# Patient Record
Sex: Female | Born: 1935 | Race: White | Hispanic: No | Smoking: Never smoker
Health system: Southern US, Community
[De-identification: ages and names within clinical notes are randomized; demographics above are authoritative.]

## PROBLEM LIST (undated history)

## (undated) DIAGNOSIS — E785 Hyperlipidemia, unspecified: Secondary | ICD-10-CM

## (undated) DIAGNOSIS — Z9889 Other specified postprocedural states: Secondary | ICD-10-CM

## (undated) DIAGNOSIS — I639 Cerebral infarction, unspecified: Secondary | ICD-10-CM

## (undated) DIAGNOSIS — K219 Gastro-esophageal reflux disease without esophagitis: Secondary | ICD-10-CM

## (undated) DIAGNOSIS — I739 Peripheral vascular disease, unspecified: Secondary | ICD-10-CM

## (undated) DIAGNOSIS — N189 Chronic kidney disease, unspecified: Secondary | ICD-10-CM

## (undated) DIAGNOSIS — I10 Essential (primary) hypertension: Secondary | ICD-10-CM

## (undated) DIAGNOSIS — R112 Nausea with vomiting, unspecified: Secondary | ICD-10-CM

## (undated) DIAGNOSIS — R55 Syncope and collapse: Secondary | ICD-10-CM

## (undated) DIAGNOSIS — N393 Stress incontinence (female) (male): Secondary | ICD-10-CM

## (undated) HISTORY — PX: ENDOPROSTHESIS HIP: SUR436

## (undated) HISTORY — PX: HX STENTING (ANY): 2100001347

## (undated) HISTORY — PX: CATARACT EXTRACTION W/ INTRAOCULAR LENS IMPLANT: SHX1309

## (undated) HISTORY — PX: BREAST BIOPSY: SHX20

## (undated) HISTORY — DX: Hyperlipidemia, unspecified: E78.5

## (undated) HISTORY — PX: PERIPHERAL ARTERIAL STENT GRAFT: SHX2220

## (undated) HISTORY — PX: FRACTURE SURGERY: SHX138

## (undated) HISTORY — DX: Stress incontinence (female) (male): N39.3

## (undated) HISTORY — DX: Syncope and collapse: R55

## (undated) HISTORY — DX: Chronic kidney disease, unspecified: N18.9

## (undated) HISTORY — DX: Cerebral infarction, unspecified: I63.9

## (undated) HISTORY — DX: Essential (primary) hypertension: I10

## (undated) HISTORY — PX: ORIF ANKLE FRACTURE: SUR919

---

## 1979-04-29 HISTORY — PX: DILATION AND CURETTAGE OF UTERUS: SHX78

## 1979-04-29 HISTORY — PX: APPENDECTOMY: SHX54

## 1979-04-29 HISTORY — PX: ABDOMINAL HYSTERECTOMY: SHX81

## 2003-01-13 ENCOUNTER — Encounter: Payer: Self-pay | Admitting: Internal Medicine

## 2003-01-13 ENCOUNTER — Encounter: Payer: Self-pay | Admitting: Emergency Medicine

## 2003-01-13 ENCOUNTER — Inpatient Hospital Stay (HOSPITAL_COMMUNITY): Admission: EM | Admit: 2003-01-13 | Discharge: 2003-01-16 | Payer: Self-pay | Admitting: Emergency Medicine

## 2003-03-20 ENCOUNTER — Ambulatory Visit (HOSPITAL_COMMUNITY): Admission: RE | Admit: 2003-03-20 | Discharge: 2003-03-20 | Payer: Self-pay | Admitting: Orthopedic Surgery

## 2003-04-03 ENCOUNTER — Ambulatory Visit (HOSPITAL_BASED_OUTPATIENT_CLINIC_OR_DEPARTMENT_OTHER): Admission: RE | Admit: 2003-04-03 | Discharge: 2003-04-04 | Payer: Self-pay | Admitting: Orthopedic Surgery

## 2003-09-10 ENCOUNTER — Encounter: Admission: RE | Admit: 2003-09-10 | Discharge: 2003-09-10 | Payer: Self-pay | Admitting: Family Medicine

## 2003-09-14 ENCOUNTER — Ambulatory Visit (HOSPITAL_COMMUNITY): Admission: RE | Admit: 2003-09-14 | Discharge: 2003-09-14 | Payer: Self-pay | Admitting: Orthopedic Surgery

## 2003-09-14 ENCOUNTER — Ambulatory Visit (HOSPITAL_BASED_OUTPATIENT_CLINIC_OR_DEPARTMENT_OTHER): Admission: RE | Admit: 2003-09-14 | Discharge: 2003-09-14 | Payer: Self-pay | Admitting: Orthopedic Surgery

## 2004-02-03 ENCOUNTER — Ambulatory Visit (HOSPITAL_BASED_OUTPATIENT_CLINIC_OR_DEPARTMENT_OTHER): Admission: RE | Admit: 2004-02-03 | Discharge: 2004-02-03 | Payer: Self-pay | Admitting: Orthopedic Surgery

## 2004-02-03 ENCOUNTER — Ambulatory Visit (HOSPITAL_COMMUNITY): Admission: RE | Admit: 2004-02-03 | Discharge: 2004-02-03 | Payer: Self-pay | Admitting: Orthopedic Surgery

## 2006-05-28 ENCOUNTER — Encounter: Admission: RE | Admit: 2006-05-28 | Discharge: 2006-05-28 | Payer: Self-pay | Admitting: Family Medicine

## 2010-08-28 DIAGNOSIS — R55 Syncope and collapse: Secondary | ICD-10-CM

## 2010-08-28 HISTORY — DX: Syncope and collapse: R55

## 2011-02-21 ENCOUNTER — Ambulatory Visit
Admission: RE | Admit: 2011-02-21 | Discharge: 2011-02-21 | Disposition: A | Discharge status: Home / Self Care | Payer: Medicare Other | Source: Ambulatory Visit | Attending: Urology | Admitting: Urology

## 2011-02-21 ENCOUNTER — Other Ambulatory Visit: Payer: Self-pay | Admitting: Urology

## 2011-02-21 DIAGNOSIS — R32 Unspecified urinary incontinence: Secondary | ICD-10-CM

## 2011-02-23 ENCOUNTER — Ambulatory Visit (HOSPITAL_BASED_OUTPATIENT_CLINIC_OR_DEPARTMENT_OTHER)
Admission: RE | Admit: 2011-02-23 | Discharge: 2011-02-23 | Disposition: A | Discharge status: Home / Self Care | Payer: Medicare Other | Source: Ambulatory Visit | Attending: Urology | Admitting: Urology

## 2011-02-23 DIAGNOSIS — N3642 Intrinsic sphincter deficiency (ISD): Secondary | ICD-10-CM | POA: Insufficient documentation

## 2011-02-23 DIAGNOSIS — K219 Gastro-esophageal reflux disease without esophagitis: Secondary | ICD-10-CM | POA: Insufficient documentation

## 2011-02-23 DIAGNOSIS — I10 Essential (primary) hypertension: Secondary | ICD-10-CM | POA: Insufficient documentation

## 2011-02-23 DIAGNOSIS — Z7982 Long term (current) use of aspirin: Secondary | ICD-10-CM | POA: Insufficient documentation

## 2011-02-23 DIAGNOSIS — Z79899 Other long term (current) drug therapy: Secondary | ICD-10-CM | POA: Insufficient documentation

## 2011-02-23 DIAGNOSIS — N393 Stress incontinence (female) (male): Secondary | ICD-10-CM | POA: Insufficient documentation

## 2011-02-23 LAB — POCT I-STAT, CHEM 8
BUN: 30 mg/dL — ABNORMAL HIGH (ref 6–23)
Creatinine, Ser: 1.4 mg/dL — ABNORMAL HIGH (ref 0.50–1.10)
Potassium: 4 mEq/L (ref 3.5–5.1)
Sodium: 140 mEq/L (ref 135–145)
TCO2: 25 mmol/L (ref 0–100)

## 2011-02-26 NOTE — Op Note (Signed)
NAME:  Jennifer Atkinson, Jennifer Atkinson                ACCOUNT NO.:  [id number]  MEDICAL RECORD NO.:  [id number]  LOCATION:                                 FACILITY:  PHYSICIAN:  Excell Seltzer. Jennifer Atkinson, M.D.         DATE OF BIRTH:  DATE OF PROCEDURE:  02/23/2011 DATE OF DISCHARGE:                              OPERATIVE REPORT   PROCEDURE:  Macroplastique injection.  PREOPERATIVE DIAGNOSIS:  Stress incontinence with intrinsic sphincter deficiency.  POSTOPERATIVE DIAGNOSIS:  Stress incontinence with intrinsic sphincter deficiency.  SURGEON:  Excell Seltzer. Jennifer Atkinson, M.D.  ANESTHESIA:  MAC.  DRAINS:  None.  COMPLICATIONS:  None.  INDICATIONS:  The patient is an 77 year old white female who has a history of stress incontinence with intrinsic sphincter deficiency.  She had previously undergone a Contigen injection and had good results but that has worn off and her incontinence has recurred.  She has now elected to proceed with Macroplastique injection.  FINDINGS AND PROCEDURE:  She was taken to the operating room.  She was given Cipro.  She was placed on table in the lithotomy position and sedation was induced.  Her perineum and genitalia were prepped with Betadine solution and she was draped in usual sterile fashion.  The 24- [place] injection scope was prepared and fitted with the Macroplastique needle and a 12-degree lens in the injection handle.  A Macroplastique 2.5 cc syringe was placed in the injection handle and secured to the needle and the needle was primed.  The instrument was then inserted.  Inspection revealed some thin mucosa overlying prior collagen but in general an unremarkable and somewhat patulous urethra.  The bladder was unremarkable as well.  No tumors, stones or inflammation noted.  Ureteral orifices were unremarkable.  The injection needle was then placed at the 9 o'clock position in the mid to distal ureter at 45-degree angle and advanced to the initial black mark then turned parallel  to the urethra and advanced to the second black mark.  Contigen was injected but minimal bulging was noted. No Contigen was noted protruding from the mucosa initially but after instillation of the syringe, inspection in the bladder revealed debris and a small area of ooze right at the bladder neck despite having the needle placed well distal to the bladder neck.  At this point, a second syringe was secured to the needle and injection was made at the 3 o'clock position beneath the prior collagen injection site.  Bulging of the mucosa was noted but the mucosa split over the collagen, so some of the old collagen and a little bit of Macroplastique also was noted to extrude.  Approximately 1.25 cc was placed in this location.  I then repositioned the needle to the 6 o'clock position and I advanced in the standard fashion and injected the remaining 1.25 cc of Macroplastique collagen.  There was good bulging of the mucosa from this injection without extrusion.  In each site, the needle was left in place 30 seconds after completion of the injection.  At this point, the scope was removed.  The bladder was felt to be full based on vaginal palpation and pressure on the bladder from  above and below produced no leakage.  It was felt that additional Macroplastique was not indicated at this time.  The bladder was then drained with a 12- French red rubber catheter which passed easily.  Once the bladder was drained, the patient was taken down from lithotomy position.  Her anesthetic was reversed.  She was moved to the recovery room in stable condition.  There were no complications.  She will be sent home with a prescription for Cipro for the next week with more prolonged antibiotic because of the exposed collagen and Macroplastique.  She has followup with me on March 13, 2011.     Excell Seltzer. Jennifer Atkinson, M.D.     JJW/MEDQ  D:  02/23/2011  T:  02/23/2011  Job:  [id number]  cc:   L. Lupe Carney, M.D. Fax:  [phone]  Electronically Signed by Bjorn Pippin M.D. on 02/26/2011 12:08:46 PM

## 2011-03-04 ENCOUNTER — Inpatient Hospital Stay (HOSPITAL_COMMUNITY)
Admission: EM | Admit: 2011-03-04 | Discharge: 2011-03-05 | DRG: 312 | Disposition: A | Discharge status: Home / Self Care | Payer: Medicare Other | Attending: Internal Medicine | Admitting: Internal Medicine

## 2011-03-04 ENCOUNTER — Emergency Department (HOSPITAL_COMMUNITY): Payer: Medicare Other

## 2011-03-04 DIAGNOSIS — Z7982 Long term (current) use of aspirin: Secondary | ICD-10-CM

## 2011-03-04 DIAGNOSIS — E860 Dehydration: Secondary | ICD-10-CM | POA: Diagnosis present

## 2011-03-04 DIAGNOSIS — N183 Chronic kidney disease, stage 3 unspecified: Secondary | ICD-10-CM | POA: Diagnosis present

## 2011-03-04 DIAGNOSIS — N390 Urinary tract infection, site not specified: Secondary | ICD-10-CM | POA: Diagnosis present

## 2011-03-04 DIAGNOSIS — N179 Acute kidney failure, unspecified: Secondary | ICD-10-CM | POA: Diagnosis present

## 2011-03-04 DIAGNOSIS — E785 Hyperlipidemia, unspecified: Secondary | ICD-10-CM | POA: Diagnosis present

## 2011-03-04 DIAGNOSIS — R55 Syncope and collapse: Principal | ICD-10-CM | POA: Diagnosis present

## 2011-03-04 DIAGNOSIS — I129 Hypertensive chronic kidney disease with stage 1 through stage 4 chronic kidney disease, or unspecified chronic kidney disease: Secondary | ICD-10-CM | POA: Diagnosis present

## 2011-03-04 LAB — BASIC METABOLIC PANEL
Calcium: 8.9 mg/dL (ref 8.4–10.5)
GFR calc non Af Amer: 28 mL/min — ABNORMAL LOW (ref >60)
Potassium: 3.9 mEq/L (ref 3.5–5.1)
Sodium: 137 mEq/L (ref 135–145)

## 2011-03-04 LAB — CARDIAC PANEL(CRET KIN+CKTOT+MB+TROPI)
Relative Index: INVALID (ref 0.0–2.5)
Troponin I: <0.3 ng/mL (ref <0.30)

## 2011-03-04 LAB — DIFFERENTIAL
Basophils Absolute: 0 10*3/uL (ref 0.0–0.1)
Basophils Relative: 0 % (ref 0–1)
Lymphocytes Relative: 23 % (ref 12–46)
Monocytes Absolute: 0.5 10*3/uL (ref 0.1–1.0)
Monocytes Relative: 5 % (ref 3–12)
Neutro Abs: 6.3 10*3/uL (ref 1.7–7.7)
Neutrophils Relative %: 68 % (ref 43–77)

## 2011-03-04 LAB — URINALYSIS, ROUTINE W REFLEX MICROSCOPIC
Nitrite: NEGATIVE
Protein, ur: NEGATIVE mg/dL
Specific Gravity, Urine: 1.014 (ref 1.005–1.030)
Urobilinogen, UA: 0.2 mg/dL (ref 0.0–1.0)

## 2011-03-04 LAB — CK TOTAL AND CKMB (NOT AT ARMC)
CK, MB: 2 ng/mL (ref 0.3–4.0)
Total CK: 75 U/L (ref 7–177)

## 2011-03-04 LAB — URINE MICROSCOPIC-ADD ON

## 2011-03-04 LAB — CBC
HCT: 31 % — ABNORMAL LOW (ref 36.0–46.0)
Hemoglobin: 10.6 g/dL — ABNORMAL LOW (ref 12.0–15.0)
MCH: 29.9 pg (ref 26.0–34.0)
RBC: 3.55 MIL/uL — ABNORMAL LOW (ref 3.87–5.11)

## 2011-03-04 LAB — TROPONIN I: Troponin I: <0.3 ng/mL (ref <0.30)

## 2011-03-05 LAB — CBC
HCT: 29.1 % — ABNORMAL LOW (ref 36.0–46.0)
Hemoglobin: 9.6 g/dL — ABNORMAL LOW (ref 12.0–15.0)
MCH: 29.3 pg (ref 26.0–34.0)
MCHC: 33 g/dL (ref 30.0–36.0)
MCV: 88.7 fL (ref 78.0–100.0)
Platelets: 252 10*3/uL (ref 150–400)
RBC: 3.28 MIL/uL — ABNORMAL LOW (ref 3.87–5.11)
RDW: 14 % (ref 11.5–15.5)
WBC: 10.7 10*3/uL — ABNORMAL HIGH (ref 4.0–10.5)

## 2011-03-05 LAB — BASIC METABOLIC PANEL
CO2: 28 mEq/L (ref 19–32)
Calcium: 8.2 mg/dL — ABNORMAL LOW (ref 8.4–10.5)
Chloride: 102 mEq/L (ref 96–112)
Potassium: 4 mEq/L (ref 3.5–5.1)
Sodium: 137 mEq/L (ref 135–145)

## 2011-03-05 LAB — TSH: TSH: 4.264 u[IU]/mL (ref 0.350–4.500)

## 2011-03-06 LAB — URINE CULTURE: Culture  Setup Time: 201207081126

## 2011-03-10 ENCOUNTER — Encounter (INDEPENDENT_AMBULATORY_CARE_PROVIDER_SITE_OTHER): Payer: Medicare Other

## 2011-03-10 DIAGNOSIS — I70219 Atherosclerosis of native arteries of extremities with intermittent claudication, unspecified extremity: Secondary | ICD-10-CM

## 2011-03-14 NOTE — Discharge Summary (Signed)
  NAME:  Jennifer Atkinson, Jennifer Atkinson NO.:  [id number]  MEDICAL RECORD NO.:  [id number]  LOCATION:  3742                         FACILITY:  [facility]  PHYSICIAN:  Zannie Cove, MD     DATE OF BIRTH:  Apr 10, 1920  DATE OF ADMISSION:  03/04/2011 DATE OF DISCHARGE:                              DISCHARGE SUMMARY   PRIMARY CARE PHYSICIAN:  Dr. Lupe Carney.  DISCHARGE DIAGNOSES: 1. Syncope, most likely vasovagal. 2. Mild acute renal failure on chronic kidney disease. 3. Urinary tract infection.  Culture is pending. 4. Hypertension. 5. Dyslipidemia. 6. History of stress incontinence, status post recent Macroplastique     injection. 7. History of multiple surgeries on her right ankle for fracture and     complications, etc.  DISCHARGE MEDICATIONS:  Are as follows: 1. Aspirin 81 mg daily. 2. Levofloxacin 250 mg daily for 5 days. 3. Tylenol 650 mg q.4 h p.r.n. 4. Calcium with vitamin D over-the-counter 1 tablet daily. 5. Fish oil 1 tablet daily. 6. Hydrochlorothiazide 25 mg daily. 7. Lactobacillus acidophilus 1 tablet daily. 8. Osteo Bi-Flex 1 tablet daily. 9. Prilosec 10 mg daily. 10.Vicodin 5/500 one tablet daily as needed. 11.Xanax 0.25 mg p.o. daily.  DIAGNOSTIC INVESTIGATIONS:  Chest x-ray, no pulmonary edema, no acute cardiopulmonary process.  CT of the head shows diffuse subcortical white matter hypoattenuation.  This likely reflects sequelae of chronic microvascular ischemia, no acute intracranial findings, mild atrophy.  HOSPITAL COURSE:  [name] is a very pleasant 77 year old white female with history of hypertension presented to the hospital after syncopal event while she was at [facility] sitting and gambling on the computer. She was admitted to the hospital for evaluation.  She did not have any acute abnormalities on EKG and did not have any arrhythmias or heart block on telemetry monitor.  In addition, it was found that she had a possible urinary tract  infection and given that she had recent instrumentation for her stress incontinence.  She is started on antibiotics for 5 days for possible UTI.  Urine cultures are pending at this point.  In addition, she was found to be mildly dehydrated with slight worsening of her creatinine from 1.4 at baseline to 1.7, which improved with rehydration to 1.7.  Rest of her workup was unremarkable. The patient was due to have a 2-D echocardiogram; however, the patient is anxious to leave and she is clinically feeling great and since there is no emergent need to do this, we will defer her 2-D echocardiogram to her primary care physician who can do it on followup to evaluate for valvular disease.  Rest of her chronic medical problems remained stable. The patient is discharged in a stable condition to follow up with Dr. Lupe Carney in 1 week.  Discharge condition is stable.  Vital Signs: Temperature 98, pulse 71, blood pressure 148/68, respirations 18, satting 99% room air.  DISCHARGE FOLLOWUP:  Dr. Lupe Carney in 1 week.     Zannie Cove, MD     PJ/MEDQ  D:  03/05/2011  T:  03/05/2011  Job:  [id number]  cc:   Lupe Carney  Electronically Signed by Zannie Cove  on 03/14/2011 08:18:01 PM

## 2011-03-14 NOTE — H&P (Signed)
NAME:  Jennifer Atkinson, Jennifer Atkinson NO.:  [id number]  MEDICAL RECORD NO.:  [id number]  LOCATION:  [facility]                         FACILITY:  [facility]  PHYSICIAN:  Zannie Cove, MD     DATE OF BIRTH:  1919-10-19  DATE OF ADMISSION:  03/04/2011 DATE OF DISCHARGE:                             HISTORY & PHYSICAL   PRIMARY CARE PHYSICIAN:  Dr.  Lupe Carney.  CHIEF COMPLAINT:  Passed out.  HISTORY OF PRESENT ILLNESS:  Jennifer Atkinson is a very pleasant 77 year old female with a past history of hypertension and dyslipidemia, presents to the hospital with the above-mentioned complaint.  The patient reports that she was doing well this morning, woke up, went to [employer], subsequently had breakfast, lunch, and then in the afternoon went to [employer] to gamble with her daughter and while sitting on the chair there suddenly felt sweaty and clammy and felt like everything with hot around her and subsequently noticed by her daughter fall backward in her chair and had a transient syncopal event lasting less than a minute, no overt seizure activity.  No chest pain, palpitation, or shortness of breath.  No nausea, vomiting, diarrhea, dysuria, or urinary frequency. No fevers or chills.  No change in medicines.  Subsequently brought by the paramedics to the ER and workup here has been unremarkable and [facility] were consulted for further evaluation and management.  PAST MEDICAL HISTORY:  Significant for; 1. Hypertension. 2. Dyslipidemia. 3. History of stress incontinence. 4. History of multiple surgeries on her right ankle for fracture and     complications in hardware removal, etc.  MEDICATIONS:  Hydrochlorothiazide, Vicodin, baby aspirin, Osteo Bi-Flex, fish oil, acidophilus, and Xanax nightly.  ALLERGIES:  No known drug allergies.  SOCIAL HISTORY:  Lives by herself at home, is independent in ADLs, drives.  Denies any history of tobacco, drink a glass of  wine occasionally.  FAMILY HISTORY:  Due to advanced age noncontributory.  REVIEW OF SYSTEMS:  Negative except as per HPI.  PHYSICAL EXAMINATION:  VITAL SIGNS:  Temperature is 97.4, pulse is 70, blood pressure is 146/52, orthostatics negative, respirations 17, satting 100% on 2 L on exam. GENERAL:  This is [name], lying on stretcher, in no acute distress. HEENT:  Pupils round, reactive to light.  Extraocular movements intact. Oral mucosa is moist. NECK:  No JVD or lymphadenopathy. CARDIOVASCULAR SYSTEM:  S1 and S2, regular rate and rhythm. LUNGS:  Clear to auscultation bilaterally. ABDOMEN:  Soft, nontender with normal bowel sounds.  No organomegaly. EXTREMITIES:  No edema, clubbing, or cyanosis. NEURO:  Moves all extremities.  No localizing signs.  REVIEW OF LABORATORY DATA:  White count 9.2, hemoglobin 10.6, platelets of 271.  Chemistries, sodium 137, potassium 2.9, chloride 102, bicarb 25, BUN 36, creatinine 1.7, glucose 101.  Cardiac markers x1 negative. UA shows small leukocytes and 7-10 wbcs, otherwise clear.  ASSESSMENT AND PLAN:  Jennifer Atkinson is an 77 year old female with; 1. Syncope most likely vasovagal. 2. Mild acute renal failure and chronic kidney disease stage III. 3. Hypertension.  PLAN:  We will admit her to the telemetry floor today, cycle cardiac enzymes x2, take a 2-D echo, give gentle  fluids, and discharge in a.m. if stable.  Further management as condition evolves.     Zannie Cove, MD     PJ/MEDQ  D:  03/04/2011  T:  03/04/2011  Job:  [id number]  cc:   Dr. Lupe Carney  Electronically Signed by Zannie Cove  on 03/14/2011 08:18:07 PM

## 2011-03-28 ENCOUNTER — Encounter: Payer: Self-pay | Admitting: Vascular Surgery

## 2011-03-30 ENCOUNTER — Ambulatory Visit (INDEPENDENT_AMBULATORY_CARE_PROVIDER_SITE_OTHER): Payer: Medicare Other | Admitting: Vascular Surgery

## 2011-03-30 ENCOUNTER — Encounter: Payer: Self-pay | Admitting: Vascular Surgery

## 2011-03-30 VITALS — BP 169/81 | HR 82

## 2011-03-30 DIAGNOSIS — R55 Syncope and collapse: Secondary | ICD-10-CM

## 2011-03-30 DIAGNOSIS — I70219 Atherosclerosis of native arteries of extremities with intermittent claudication, unspecified extremity: Secondary | ICD-10-CM

## 2011-03-30 NOTE — Progress Notes (Signed)
Subjective:     Patient ID: Jennifer Atkinson, female   DOB: 1919-11-04, 77 y.o.   MRN: [id number]  HPI Patient is an 77 year old female who complained of a right calf cramp early in July that lasted several days. She has had persistent soreness in the right calf with intermittent cramping since that time. The cramping is inconsistent and occurs at variable walking distances. She has no rest pain. She has no ulceration.  Chronic medical problems include elevated cholesterol, hypertension, renal insufficiency. These are currently stable and followed by Dr. Clovis Riley  History   Social History  . Marital Status: Married    Spouse Name: N/A    Number of Children: N/A  . Years of Education: N/A   Occupational History  . Not on file.   Social History Main Topics  . Smoking status: Never Smoker   . Smokeless tobacco: Not on file  . Alcohol Use: Yes     OCCASIONAL   . Drug Use: No  . Sexually Active:    Other Topics Concern  . Not on file   Social History Narrative  . No narrative on file   Family History  Problem Relation Age of Onset  . Diabetes Father   . Hypertension Father    Past Medical History  Diagnosis Date  . Syncopal episodes   . Chronic kidney disease   . Hypertension   . Hyperlipidemia   . SUI (stress urinary incontinence, female)     Recent Macroplastique     Review of Systems  Constitutional: Negative.   HENT: Negative.   Eyes: Negative.   Respiratory: Negative.   Cardiovascular: Negative.   Gastrointestinal: Negative.   Genitourinary: Negative.   Musculoskeletal: Positive for myalgias.  Skin: Negative.   Neurological: Positive for syncope.  Hematological: Negative.   Psychiatric/Behavioral: Negative.    Review of Systems  Constitutional: Negative.   HENT: Negative.   Eyes: Negative.   Respiratory: Negative.   Cardiovascular: Negative.   Gastrointestinal: Negative.   Genitourinary: Negative.   Musculoskeletal: Positive for myalgias.  Skin:  Negative.   Neurological: Positive for syncope.  Endo/Heme/Allergies: Negative.   Psychiatric/Behavioral: Negative.        Objective:   Physical Exam Blood pressure 169/81, pulse 82, SpO2 97.00%.  HEENT-negative  Chest: Clear to auscultation bilaterally  Cardiac-regular rate and rhythm without murmur  Abdomen-soft nontender nondistended no palpable mass  Musculoskeletal no obvious major joint deformities  Neuro: Symmetric upper extremity and lower assuring motor strength  Skin: No ulcers or rashes  Extremities: 2+ radial and femoral pulses bilaterally, left dorsalis pedis pulse 2+, right foot pedal pulses absent, both feet pink warm and well-perfused  Vascular lab: ABI right 0.75 with biphasic waveforms, left 0.99 with biphasic waveforms    Assessment:     Leg cramp acute onset not characteristic of typical claudication    Plan:     Antiinflammatory Tylenol arthritis  Heat/ice  Follow up PRN if symptoms not improved in 1 month It was discussed with the patient and her daughter today that most likely the symptoms are not related to arterial occlusive disease. Her symptoms are not characteristic of typical claudication. I believe this should respond anti-inflammatories. She was also reassured that her perfusion to both feet is adequate and that she is not at risk of limb loss at this point.

## 2011-04-07 ENCOUNTER — Encounter: Payer: PRIVATE HEALTH INSURANCE | Admitting: Vascular Surgery

## 2011-08-31 DIAGNOSIS — N3642 Intrinsic sphincter deficiency (ISD): Secondary | ICD-10-CM | POA: Diagnosis not present

## 2011-10-10 DIAGNOSIS — I129 Hypertensive chronic kidney disease with stage 1 through stage 4 chronic kidney disease, or unspecified chronic kidney disease: Secondary | ICD-10-CM | POA: Diagnosis not present

## 2011-10-10 DIAGNOSIS — I10 Essential (primary) hypertension: Secondary | ICD-10-CM | POA: Diagnosis not present

## 2011-10-10 DIAGNOSIS — N058 Unspecified nephritic syndrome with other morphologic changes: Secondary | ICD-10-CM | POA: Diagnosis not present

## 2011-10-10 DIAGNOSIS — N183 Chronic kidney disease, stage 3 unspecified: Secondary | ICD-10-CM | POA: Diagnosis not present

## 2011-10-10 DIAGNOSIS — N185 Chronic kidney disease, stage 5: Secondary | ICD-10-CM | POA: Diagnosis not present

## 2011-12-04 DIAGNOSIS — M159 Polyosteoarthritis, unspecified: Secondary | ICD-10-CM | POA: Diagnosis not present

## 2011-12-04 DIAGNOSIS — I10 Essential (primary) hypertension: Secondary | ICD-10-CM | POA: Diagnosis not present

## 2011-12-04 DIAGNOSIS — K219 Gastro-esophageal reflux disease without esophagitis: Secondary | ICD-10-CM | POA: Diagnosis not present

## 2011-12-04 DIAGNOSIS — Z79899 Other long term (current) drug therapy: Secondary | ICD-10-CM | POA: Diagnosis not present

## 2011-12-04 DIAGNOSIS — F411 Generalized anxiety disorder: Secondary | ICD-10-CM | POA: Diagnosis not present

## 2011-12-12 DIAGNOSIS — B351 Tinea unguium: Secondary | ICD-10-CM | POA: Diagnosis not present

## 2011-12-15 DIAGNOSIS — M79609 Pain in unspecified limb: Secondary | ICD-10-CM | POA: Diagnosis not present

## 2011-12-15 DIAGNOSIS — R0989 Other specified symptoms and signs involving the circulatory and respiratory systems: Secondary | ICD-10-CM | POA: Diagnosis not present

## 2011-12-27 DIAGNOSIS — I739 Peripheral vascular disease, unspecified: Secondary | ICD-10-CM | POA: Diagnosis not present

## 2011-12-27 DIAGNOSIS — I10 Essential (primary) hypertension: Secondary | ICD-10-CM | POA: Diagnosis not present

## 2012-01-16 DIAGNOSIS — I739 Peripheral vascular disease, unspecified: Secondary | ICD-10-CM | POA: Diagnosis not present

## 2012-01-16 DIAGNOSIS — I10 Essential (primary) hypertension: Secondary | ICD-10-CM | POA: Diagnosis not present

## 2012-01-16 DIAGNOSIS — Z01810 Encounter for preprocedural cardiovascular examination: Secondary | ICD-10-CM | POA: Diagnosis not present

## 2012-01-16 HISTORY — PX: CARDIOVASCULAR STRESS TEST: SHX262

## 2012-01-19 DIAGNOSIS — R5381 Other malaise: Secondary | ICD-10-CM | POA: Diagnosis not present

## 2012-01-19 DIAGNOSIS — Z79899 Other long term (current) drug therapy: Secondary | ICD-10-CM | POA: Diagnosis not present

## 2012-01-19 DIAGNOSIS — D689 Coagulation defect, unspecified: Secondary | ICD-10-CM | POA: Diagnosis not present

## 2012-01-23 ENCOUNTER — Other Ambulatory Visit: Payer: Self-pay | Admitting: Cardiovascular Disease

## 2012-01-25 ENCOUNTER — Encounter (HOSPITAL_COMMUNITY)
Admission: RE | Disposition: A | Discharge status: Home / Self Care | Payer: Self-pay | Source: Ambulatory Visit | Attending: Cardiovascular Disease

## 2012-01-25 ENCOUNTER — Encounter (HOSPITAL_COMMUNITY): Payer: Self-pay | Admitting: General Practice

## 2012-01-25 ENCOUNTER — Ambulatory Visit (HOSPITAL_COMMUNITY)
Admission: RE | Admit: 2012-01-25 | Discharge: 2012-01-26 | Disposition: A | Discharge status: Home / Self Care | Payer: Medicare Other | Source: Ambulatory Visit | Attending: Cardiovascular Disease | Admitting: Cardiovascular Disease

## 2012-01-25 DIAGNOSIS — I70219 Atherosclerosis of native arteries of extremities with intermittent claudication, unspecified extremity: Secondary | ICD-10-CM | POA: Insufficient documentation

## 2012-01-25 DIAGNOSIS — I129 Hypertensive chronic kidney disease with stage 1 through stage 4 chronic kidney disease, or unspecified chronic kidney disease: Secondary | ICD-10-CM | POA: Diagnosis not present

## 2012-01-25 DIAGNOSIS — N183 Chronic kidney disease, stage 3 unspecified: Secondary | ICD-10-CM | POA: Diagnosis not present

## 2012-01-25 DIAGNOSIS — E785 Hyperlipidemia, unspecified: Secondary | ICD-10-CM | POA: Diagnosis not present

## 2012-01-25 DIAGNOSIS — I10 Essential (primary) hypertension: Secondary | ICD-10-CM | POA: Diagnosis present

## 2012-01-25 DIAGNOSIS — I739 Peripheral vascular disease, unspecified: Secondary | ICD-10-CM | POA: Diagnosis present

## 2012-01-25 HISTORY — DX: Peripheral vascular disease, unspecified: I73.9

## 2012-01-25 HISTORY — PX: OTHER SURGICAL HISTORY: SHX169

## 2012-01-25 HISTORY — PX: PERCUTANEOUS STENT INTERVENTION: SHX5500

## 2012-01-25 HISTORY — PX: LOWER EXTREMITY ANGIOGRAM: SHX5508

## 2012-01-25 LAB — POCT ACTIVATED CLOTTING TIME
Activated Clotting Time: 199 seconds
Activated Clotting Time: 199 seconds

## 2012-01-25 SURGERY — ANGIOGRAM, LOWER EXTREMITY
Anesthesia: LOCAL

## 2012-01-25 MED ORDER — HYDROCHLOROTHIAZIDE 25 MG PO TABS
25.0000 mg | ORAL_TABLET | Freq: Every day | ORAL | Status: DC
  Administered 2012-01-25 – 2012-01-26 (×2): 25 mg via ORAL
  Filled 2012-01-25 (×2): qty 1

## 2012-01-25 MED ORDER — CLOPIDOGREL BISULFATE 75 MG PO TABS
75.0000 mg | ORAL_TABLET | Freq: Every day | ORAL | Status: DC
  Administered 2012-01-26: 75 mg via ORAL
  Filled 2012-01-25: qty 1

## 2012-01-25 MED ORDER — MIDAZOLAM HCL 2 MG/2ML IJ SOLN
INTRAMUSCULAR | Status: AC
  Filled 2012-01-25: qty 2

## 2012-01-25 MED ORDER — SODIUM CHLORIDE 0.9 % IV SOLN
INTRAVENOUS | Status: DC
  Administered 2012-01-25: 09:00:00 via INTRAVENOUS

## 2012-01-25 MED ORDER — HEPARIN (PORCINE) IN NACL 2-0.9 UNIT/ML-% IJ SOLN
INTRAMUSCULAR | Status: AC
  Filled 2012-01-25: qty 1000

## 2012-01-25 MED ORDER — ASPIRIN 81 MG PO CHEW: 81.0000 mg | CHEWABLE_TABLET | Freq: Every day | ORAL | Status: DC

## 2012-01-25 MED ORDER — SODIUM CHLORIDE 0.9 % IV SOLN: INTRAVENOUS | Status: AC

## 2012-01-25 MED ORDER — ACETAMINOPHEN 325 MG PO TABS: 650.0000 mg | ORAL_TABLET | ORAL | Status: DC | PRN

## 2012-01-25 MED ORDER — ALPRAZOLAM 0.25 MG PO TABS
0.2500 mg | ORAL_TABLET | Freq: Three times a day (TID) | ORAL | Status: DC | PRN
  Administered 2012-01-25 – 2012-01-26 (×2): 0.25 mg via ORAL
  Filled 2012-01-25 (×2): qty 1

## 2012-01-25 MED ORDER — SODIUM CHLORIDE 0.9 % IJ SOLN: 3.0000 mL | INTRAMUSCULAR | Status: DC | PRN

## 2012-01-25 MED ORDER — MORPHINE SULFATE 2 MG/ML IJ SOLN
2.0000 mg | INTRAMUSCULAR | Status: DC | PRN
  Administered 2012-01-26: 2 mg via INTRAVENOUS
  Filled 2012-01-25 (×2): qty 1

## 2012-01-25 MED ORDER — HYDRALAZINE HCL 20 MG/ML IJ SOLN
10.0000 mg | INTRAMUSCULAR | Status: DC
  Filled 2012-01-25: qty 0.5

## 2012-01-25 MED ORDER — LIDOCAINE HCL (PF) 1 % IJ SOLN
INTRAMUSCULAR | Status: AC
  Filled 2012-01-25: qty 30

## 2012-01-25 MED ORDER — HEPARIN SODIUM (PORCINE) 1000 UNIT/ML IJ SOLN
INTRAMUSCULAR | Status: AC
  Filled 2012-01-25: qty 1

## 2012-01-25 MED ORDER — DIAZEPAM 5 MG PO TABS: 5.0000 mg | ORAL_TABLET | ORAL | Status: DC

## 2012-01-25 MED ORDER — ASPIRIN EC 325 MG PO TBEC
325.0000 mg | DELAYED_RELEASE_TABLET | Freq: Every day | ORAL | Status: DC
  Administered 2012-01-26: 325 mg via ORAL
  Filled 2012-01-25: qty 1

## 2012-01-25 MED ORDER — MORPHINE SULFATE 2 MG/ML IJ SOLN: 1.0000 mg | INTRAMUSCULAR | Status: DC | PRN

## 2012-01-25 MED ORDER — TRAMADOL HCL 50 MG PO TABS
50.0000 mg | ORAL_TABLET | Freq: Four times a day (QID) | ORAL | Status: DC | PRN
  Filled 2012-01-25: qty 1

## 2012-01-25 MED ORDER — FENTANYL CITRATE 0.05 MG/ML IJ SOLN
INTRAMUSCULAR | Status: AC
  Filled 2012-01-25: qty 2

## 2012-01-25 MED ORDER — ONDANSETRON HCL 4 MG/2ML IJ SOLN: 4.0000 mg | Freq: Four times a day (QID) | INTRAMUSCULAR | Status: DC | PRN

## 2012-01-25 NOTE — H&P (Signed)
H & P will be scanned in.  Pt was reexamined and existing H & P reviewed. No changes found.  Runell Gess, MD [facility] 01/25/2012 11:58 AM

## 2012-01-25 NOTE — Op Note (Signed)
Jennifer Atkinson is a 78 y.o. female    [id number] LOCATION:  FACILITY: [facility]  PHYSICIAN: Nanetta Batty, M.D. November 30, 1919   DATE OF PROCEDURE:  01/25/2012  DATE OF DISCHARGE:  [facility] PV Intervention    History obtained from chart review. Jennifer Atkinson is an 78 year old widowed Caucasian female mother of 4, grandmother to 10 red children referred by Dr. Kyra Manges in the [facility] but purposeful vasodilation because of claudication in Jennifer Atkinson right calf receptors only include hypertension. She does complain of lifestyle limiting right calf claudication Dopplers performed in our office revealed right ABI 0.81 with a high-frequency signal in the mid right SFA and a left ABI 0.93. She presents now for angiography and potential intervention .Jennifer Atkinson creatinine clearance is 30 cc per minute.     PROCEDURE DESCRIPTION:    The patient was brought to the second floor  [place] [facility] cath lab in the postabsorptive state. She was  premedicated  5 mg by mouth and IV Versed and fentanyl.. Jennifer Atkinson left groin was prepped and shaved in usual sterile fashion. Xylocaine 1% was used  for local anesthesia. A 5 French sheath was inserted into the left common femoral  artery using standard Seldinger technique. A 5 French pigtail catheter was used for abdominal aortography. Contralateral access was obtained with a 5 [place] crossover catheter and will catheter. Right lower extremity angiography was performed using bolus chase a step table digital subtraction technique. Visipaque dye was used for the entirety of the case. Retrograde aortic pressures monitored in the case.  HEMODYNAMICS:    AO SYSTOLIC/AO DIASTOLIC: 172/64    ANGIOGRAPHIC RESULTS:   1: abdominal aorta-normal  2: Right lower extremity-segmental 60-70% stenosis in the mid right SFA with three-vessel runoff  IMPRESSION:Jennifer Atkinson has moderately severe segmental mid right SFA stenosis correlating with Jennifer Atkinson duplex abnormality  and correlating with Jennifer Atkinson claudication. We'll proceed with PTA and stenting using Nitinol self-expanding stents.  Procedure description: patient received 6000 units of heparin intravenously ending ACT 199. Total of 84 cc of contrast was used during the case. A 6 French destination sheath was then exchanged for the end hole and advanced over the iliac bifurcation. An 15 Wholey wire was advanced down the SFA and the above-the-knee popliteal artery. predilatation was performed with 4 mm x 2 cm long balloon. Stenting was then performed with a 6 mm x 100 mm long Cordis Nitinol Smart self expanding stent. postdilatation was performed with a 5 mm x 80 mm long balloon at nominal pressures. Final angiography result in reduction of a long 60-70% stenosis to 0% residual excellent flow, and no dissection noted.  Final impression: Successful PTA and stenting of a moderately severe segmental right SFA stenosis using a Nitinol self expanding stent for lifestyle limiting claudication. The sheath was then withdrawn across the bifurcation. The patient left lateral stable condition. She'll it will be removed once the ACT falls below 170 and pressure will be held on the groin to achieve hemostasis. Patient was gently hydrated overnight and Jennifer Atkinson renal function will be assessed in the morning given Jennifer Atkinson moderate renal insufficiency. Should he choose aspirin and Plavix. She'll be discharged home and will obtain followup Dopplers in our office after which he'll see me back. She left the catheterization laboratory in stable condition.  Runell Gess MD, [facility] 01/25/2012 12:02 PM

## 2012-01-25 NOTE — Progress Notes (Signed)
Site area: left groin  Site Prior to Removal:  Level 0  Pressure Applied For 20 MINUTES    Minutes Beginning at 1405  Manual:   yes  Patient Status During Pull:  stable  Post Pull Groin Site:  Level 0  Post Pull Instructions Given:  yes  Post Pull Pulses Present:  yes  Dressing Applied:  yes  Comments:

## 2012-01-26 DIAGNOSIS — I70219 Atherosclerosis of native arteries of extremities with intermittent claudication, unspecified extremity: Secondary | ICD-10-CM | POA: Diagnosis not present

## 2012-01-26 LAB — BASIC METABOLIC PANEL
BUN: 17 mg/dL (ref 6–23)
Calcium: 9.1 mg/dL (ref 8.4–10.5)
Chloride: 99 mEq/L (ref 96–112)
Creatinine, Ser: 1.1 mg/dL (ref 0.50–1.10)
GFR calc Af Amer: 50 mL/min — ABNORMAL LOW (ref >90)
GFR calc non Af Amer: 43 mL/min — ABNORMAL LOW (ref >90)
Potassium: 3.8 mEq/L (ref 3.5–5.1)
Sodium: 136 mEq/L (ref 135–145)

## 2012-01-26 LAB — CBC
MCHC: 33.2 g/dL (ref 30.0–36.0)
Platelets: 246 10*3/uL (ref 150–400)
RDW: 13.9 % (ref 11.5–15.5)
WBC: 8.7 10*3/uL (ref 4.0–10.5)

## 2012-01-26 MED ORDER — CLOPIDOGREL BISULFATE 75 MG PO TABS: 75.0000 mg | ORAL_TABLET | Freq: Every day | ORAL | Status: AC

## 2012-01-26 NOTE — Discharge Summary (Signed)
Physician Discharge Summary  Patient ID: DEEDRA PRO MRN: [id number] DOB/AGE: [birth year] 78 y.o.  Admit date: 01/25/2012 Discharge date: 01/26/2012  Admission Diagnoses:  Claudication  Discharge Diagnoses:  Principal Problem:  *Claudication Active Problems:  PVD, Rt SFA stent this admission  Chronic renal disease, stage III  HTN (hypertension)  Dyslipidemia   Discharged Condition: stable  Hospital Course:   Ms. Piggott is an 78 year old widowed Caucasian female mother of 4, grandmother to 10 children referred by Dr. Gerlene Burdock because of claudication in her right calf.  Risk factors only include hypertension. She complained of lifestyle limiting right calf claudication Dopplers performed in our office revealed right ABI 0.81 with a high-frequency signal in the mid right SFA and a left ABI 0.93. She presented now for angiography and potential intervention.  Successful PTA and stenting of a moderately severe segmental right SFA stenosis using a Nitinol self expanding stent for lifestyle limiting claudication.  She was discharged in stable condition and will have lower extremity arterial dopplers in approximately three weeks then return for follow-up with Dr. Allyson Sabal.   Consults: None  Significant Diagnostic Studies:  HEMODYNAMICS:  AO SYSTOLIC/AO DIASTOLIC: 172/64  ANGIOGRAPHIC RESULTS:  1: abdominal aorta-normal  2: Right lower extremity-segmental 60-70% stenosis in the mid right SFA with three-vessel runoff  IMPRESSION:Ms. Macfadden has moderately severe segmental mid right SFA stenosis correlating with her duplex abnormality and correlating with her claudication. We'll proceed with PTA and stenting using Nitinol self-expanding stents.  Procedure description: patient received 6000 units of heparin intravenously ending ACT 199. Total of 84 cc of contrast was used during the case. A 6 French destination sheath was then exchanged for the end hole and advanced over the iliac bifurcation. An 27  Wholey wire was advanced down the SFA and the above-the-knee popliteal artery. predilatation was performed with 4 mm x 2 cm long balloon. Stenting was then performed with a 6 mm x 100 mm long Cordis Nitinol Smart self expanding stent. postdilatation was performed with a 5 mm x 80 mm long balloon at nominal pressures. Final angiography result in reduction of a long 60-70% stenosis to 0% residual excellent flow, and no dissection noted.  Final impression: Successful PTA and stenting of a moderately severe segmental right SFA stenosis using a Nitinol self expanding stent for lifestyle limiting claudication. The sheath was then withdrawn across the bifurcation. The patient left lateral stable condition. She'll it will be removed once the ACT falls below 170 and pressure will be held on the groin to achieve hemostasis. Patient was gently hydrated overnight and her renal function will be assessed in the morning given her moderate renal insufficiency. Should he choose aspirin and Plavix. She'll be discharged home and will obtain followup Dopplers in our office after which he'll see me back. She left the catheterization laboratory in stable condition.  Runell Gess MD, [facility]  01/25/2012  12:02 PM     Treatments: Nitinol self expanding stent  Discharge Exam: Blood pressure 154/59, pulse 70, temperature 98.7 F (37.1 C), temperature source Oral, resp. rate 18, height 5\' 7"  (1.702 m), weight 72.3 kg (159 lb 6.3 oz), SpO2 98.00%.   Disposition: 01-Home or Self Care  Discharge Orders    Future Orders Please Complete By Expires   Diet - low sodium heart healthy      Increase activity slowly      Discharge instructions      Comments:   No lifting anything heavier than a gallon of milk and if you drive,  no driving for three days.     Medication List  As of 01/26/2012  3:32 PM   TAKE these medications         Acidophilus Caps   Take 1 capsule by mouth daily.      ALPRAZolam 0.25 MG tablet    Commonly known as: XANAX   Take 0.25 mg by mouth at bedtime as needed. For sleep      aspirin 81 MG chewable tablet   Chew 81 mg by mouth daily.      calcium-vitamin D 500-200 MG-UNIT per tablet   Take 1 tablet by mouth daily.      clopidogrel 75 MG tablet   Commonly known as: PLAVIX   Take 1 tablet (75 mg total) by mouth daily with breakfast.      fish oil-omega-3 fatty acids 1000 MG capsule   Take 1 g by mouth daily.      hydrochlorothiazide 25 MG tablet   Commonly known as: HYDRODIURIL   Take 25 mg by mouth daily.      HYDROcodone-acetaminophen 5-500 MG per tablet   Commonly known as: VICODIN   Take 1 tablet by mouth every 4 (four) hours as needed. For pain      omeprazole 10 MG capsule   Commonly known as: PRILOSEC   Take 10 mg by mouth daily.      OSTEO BI-FLEX REGULAR STRENGTH PO   Take by mouth.           Follow-up Information    Follow up with Runell Gess, MD. (Our office will call you with the appointment  dates and times.  The ultrasound will be in approx. three weeks.)    Contact information:   [address] [place] [address] [phone]          Signed: Wilburt Finlay 01/26/2012, 3:32 PM  She tolerated her procedure well.  Groin stable.  Ambulating without claudication.  Stable for discharge & ROV with Dr. Allyson Sabal.  Marykay Lex, M.D., M.S. [facility] [address] [place], [place]  [address]  [phone] Pager # [phone]  01/26/2012 8:46 PM

## 2012-01-26 NOTE — Progress Notes (Signed)
Patient ambulated hall with standby assist with NT and tolerated well denied any complications. Family at bedside will continue to monitor.

## 2012-01-26 NOTE — Discharge Instructions (Signed)

## 2012-01-26 NOTE — Progress Notes (Signed)
[facility]  Subjective: Doing well.  Minor soreness at cath site.  Objective: Vital signs in last 24 hours: Temp:  [97 F (36.1 C)-98.7 F (37.1 C)] 98.7 F (37.1 C) (05/31 0736) Pulse Rate:  [59-83] 70  (05/31 0736) Resp:  [12-20] 18  (05/31 0736) BP: (108-167)/(41-92) 154/59 mmHg (05/31 0736) SpO2:  [94 %-100 %] 98 % (05/31 0736) Weight:  [72.3 kg (159 lb 6.3 oz)-72.576 kg (160 lb)] 72.3 kg (159 lb 6.3 oz) (05/31 0053) Last BM Date: 01/24/12  Intake/Output from previous day: 05/30 0701 - 05/31 0700 In: 923.8 [P.O.:480; I.V.:443.8] Out: 300 [Urine:300] Intake/Output this shift:    Medications Current Facility-Administered Medications  Medication Dose Route Frequency Provider Last Rate Last Dose  . 0.9 %  sodium chloride infusion   Intravenous Continuous Runell Gess, MD 75 mL/hr at 01/25/12 1236    . acetaminophen (TYLENOL) tablet 650 mg  650 mg Oral Q4H PRN Runell Gess, MD      . ALPRAZolam Prudy Feeler) tablet 0.25 mg  0.25 mg Oral TID PRN Abelino Derrick, PA   0.25 mg at 01/25/12 2203  . aspirin EC tablet 325 mg  325 mg Oral Daily Runell Gess, MD      . clopidogrel (PLAVIX) tablet 75 mg  75 mg Oral Q breakfast Runell Gess, MD      . fentaNYL (SUBLIMAZE) 0.05 MG/ML injection           . heparin 1000 UNIT/ML injection           . heparin 2-0.9 UNIT/ML-% infusion           . hydrALAZINE (APRESOLINE) injection 10 mg  10 mg Intravenous UD Runell Gess, MD      . hydrochlorothiazide (HYDRODIURIL) tablet 25 mg  25 mg Oral Daily Runell Gess, MD   25 mg at 01/25/12 1606  . lidocaine (XYLOCAINE) 1 % injection           . midazolam (VERSED) 2 MG/2ML injection           . morphine 2 MG/ML injection 2 mg  2 mg Intravenous Q1H PRN Abelino Derrick, PA   2 mg at 01/26/12 0218  . ondansetron (ZOFRAN) injection 4 mg  4 mg Intravenous Q6H PRN Runell Gess, MD      . traMADol Janean Sark) tablet 50 mg  50 mg Oral Q6H PRN Abelino Derrick, [place]       . DISCONTD: 0.9 %  sodium chloride infusion   Intravenous Continuous Runell Gess, MD 75 mL/hr at 01/25/12 [phone]    . DISCONTD: aspirin chewable tablet 81 mg  81 mg Oral Daily Runell Gess, MD      . DISCONTD: diazepam (VALIUM) tablet 5 mg  5 mg Oral On Call Runell Gess, MD      . DISCONTD: morphine 2 MG/ML injection 1 mg  1 mg Intravenous Q1H PRN Runell Gess, MD      . DISCONTD: sodium chloride 0.9 % injection 3 mL  3 mL Intravenous PRN Runell Gess, MD        PE: General appearance: alert, cooperative and no distress Lungs: clear to auscultation bilaterally Heart: regular rate and rhythm, S1, S2 normal, no murmur, click, rub or gallop Extremities: No LEE Pulses: Radials 2+ and symmetric, 1+ left DP,  2+ right PT Left groin:  Mildly tender,  Small hematoma, no bruit or ecchymosis.  Lab Results:  Basename 01/26/12 0433  WBC 8.7  HGB 11.5*  HCT 34.6*  PLT 246   BMET  Basename 01/26/12 0433  NA 136  K 3.8  CL 99  CO2 27  GLUCOSE 99  BUN 17  CREATININE 1.10  CALCIUM 9.1    Studies/Results: HEMODYNAMICS:  AO SYSTOLIC/AO DIASTOLIC: 172/64  ANGIOGRAPHIC RESULTS:  1: abdominal aorta-normal  2: Right lower extremity-segmental 60-70% stenosis in the mid right SFA with three-vessel runoff  IMPRESSION:Jennifer Atkinson has moderately severe segmental mid right SFA stenosis correlating with her duplex abnormality and correlating with her claudication. We'll proceed with PTA and stenting using Nitinol self-expanding stents.  Procedure description: patient received 6000 units of heparin intravenously ending ACT 199. Total of 84 cc of contrast was used during the case. A 6 French destination sheath was then exchanged for the end hole and advanced over the iliac bifurcation. An 3 Wholey wire was advanced down the SFA and the above-the-knee popliteal artery. predilatation was performed with 4 mm x 2 cm long balloon. Stenting was then performed with a 6 mm x 100 mm long  Cordis Nitinol Smart self expanding stent. postdilatation was performed with a 5 mm x 80 mm long balloon at nominal pressures. Final angiography result in reduction of a long 60-70% stenosis to 0% residual excellent flow, and no dissection noted.  Final impression: Successful PTA and stenting of a moderately severe segmental right SFA stenosis using a Nitinol self expanding stent for lifestyle limiting claudication. The sheath was then withdrawn across the bifurcation. The patient left lateral stable condition. She'll it will be removed once the ACT falls below 170 and pressure will be held on the groin to achieve hemostasis. Patient was gently hydrated overnight and her renal function will be assessed in the morning given her moderate renal insufficiency. Should he choose aspirin and Plavix. She'll be discharged home and will obtain followup Dopplers in our office after which she'll see me back. She left the catheterization laboratory in stable condition.  Runell Gess MD, [facility]  01/25/2012  12:02 PM   Assessment/Plan    Principal Problem:  *Claudication Active Problems:  PVD, Rt SFA stent this admission  Chronic renal disease, stage III  HTN (hypertension)  Dyslipidemia  Plan:  S/P successful PTA and stenting of a moderately severe segmental right SFA stenosis using a Nitinol self expanding stent for lifestyle limiting claudication.  Groin looks good.  DC home today.  ASA/plavix.  OP dopplers and F/U with Dr. Allyson Sabal.   LOS: 1 day   HAGER, BRYAN 01/26/2012 8:14 AM  Looks good post PTA for RSFA.  Ambulating without claudication. BP borderline elevated, but she is otherwise stable for discharge. --> f/u with JB after LEA Dopplers. I agree with Mr. Jasper Riling findings & examination.  Marykay Lex, M.D., M.S. [facility] [address] [place], [place]  [address]  [phone] Pager # [phone]  01/26/2012 8:45 PM

## 2012-01-26 NOTE — Progress Notes (Signed)
Patient and family requesting to be discharged because patients sister in the hospital not doing well patient want to go be with sister. PA notified discharge instructions given patient stable at this time. Family at bedside.

## 2012-02-16 DIAGNOSIS — L570 Actinic keratosis: Secondary | ICD-10-CM | POA: Diagnosis not present

## 2012-02-16 DIAGNOSIS — Z85828 Personal history of other malignant neoplasm of skin: Secondary | ICD-10-CM | POA: Diagnosis not present

## 2012-02-16 DIAGNOSIS — D239 Other benign neoplasm of skin, unspecified: Secondary | ICD-10-CM | POA: Diagnosis not present

## 2012-02-19 DIAGNOSIS — I70219 Atherosclerosis of native arteries of extremities with intermittent claudication, unspecified extremity: Secondary | ICD-10-CM | POA: Diagnosis not present

## 2012-02-19 DIAGNOSIS — I739 Peripheral vascular disease, unspecified: Secondary | ICD-10-CM | POA: Diagnosis not present

## 2012-02-19 DIAGNOSIS — M79609 Pain in unspecified limb: Secondary | ICD-10-CM | POA: Diagnosis not present

## 2012-02-23 DIAGNOSIS — I10 Essential (primary) hypertension: Secondary | ICD-10-CM | POA: Diagnosis not present

## 2012-02-23 DIAGNOSIS — I739 Peripheral vascular disease, unspecified: Secondary | ICD-10-CM | POA: Diagnosis not present

## 2012-02-26 DIAGNOSIS — I10 Essential (primary) hypertension: Secondary | ICD-10-CM | POA: Diagnosis not present

## 2012-02-26 DIAGNOSIS — Z79899 Other long term (current) drug therapy: Secondary | ICD-10-CM | POA: Diagnosis not present

## 2012-03-21 DIAGNOSIS — M79609 Pain in unspecified limb: Secondary | ICD-10-CM | POA: Diagnosis not present

## 2012-03-21 DIAGNOSIS — I739 Peripheral vascular disease, unspecified: Secondary | ICD-10-CM | POA: Diagnosis not present

## 2012-03-25 DIAGNOSIS — E039 Hypothyroidism, unspecified: Secondary | ICD-10-CM | POA: Diagnosis not present

## 2012-03-26 DIAGNOSIS — M545 Low back pain, unspecified: Secondary | ICD-10-CM | POA: Diagnosis not present

## 2012-03-26 DIAGNOSIS — M79609 Pain in unspecified limb: Secondary | ICD-10-CM | POA: Diagnosis not present

## 2012-03-30 DIAGNOSIS — M79609 Pain in unspecified limb: Secondary | ICD-10-CM | POA: Diagnosis not present

## 2012-04-09 DIAGNOSIS — M545 Low back pain, unspecified: Secondary | ICD-10-CM | POA: Diagnosis not present

## 2012-04-13 DIAGNOSIS — M545 Low back pain, unspecified: Secondary | ICD-10-CM | POA: Diagnosis not present

## 2012-04-15 DIAGNOSIS — L570 Actinic keratosis: Secondary | ICD-10-CM | POA: Diagnosis not present

## 2012-04-18 DIAGNOSIS — M79609 Pain in unspecified limb: Secondary | ICD-10-CM | POA: Diagnosis not present

## 2012-04-18 DIAGNOSIS — M545 Low back pain, unspecified: Secondary | ICD-10-CM | POA: Diagnosis not present

## 2012-04-30 DIAGNOSIS — M545 Low back pain, unspecified: Secondary | ICD-10-CM | POA: Diagnosis not present

## 2012-05-14 DIAGNOSIS — M545 Low back pain, unspecified: Secondary | ICD-10-CM | POA: Diagnosis not present

## 2012-05-29 DIAGNOSIS — M545 Low back pain, unspecified: Secondary | ICD-10-CM | POA: Diagnosis not present

## 2012-06-04 DIAGNOSIS — I10 Essential (primary) hypertension: Secondary | ICD-10-CM | POA: Diagnosis not present

## 2012-06-04 DIAGNOSIS — M159 Polyosteoarthritis, unspecified: Secondary | ICD-10-CM | POA: Diagnosis not present

## 2012-06-04 DIAGNOSIS — K219 Gastro-esophageal reflux disease without esophagitis: Secondary | ICD-10-CM | POA: Diagnosis not present

## 2012-06-04 DIAGNOSIS — Z79899 Other long term (current) drug therapy: Secondary | ICD-10-CM | POA: Diagnosis not present

## 2012-06-04 DIAGNOSIS — Z23 Encounter for immunization: Secondary | ICD-10-CM | POA: Diagnosis not present

## 2012-06-04 DIAGNOSIS — F411 Generalized anxiety disorder: Secondary | ICD-10-CM | POA: Diagnosis not present

## 2012-06-04 DIAGNOSIS — E039 Hypothyroidism, unspecified: Secondary | ICD-10-CM | POA: Diagnosis not present

## 2012-06-04 DIAGNOSIS — N183 Chronic kidney disease, stage 3 unspecified: Secondary | ICD-10-CM | POA: Diagnosis not present

## 2012-06-10 DIAGNOSIS — M545 Low back pain, unspecified: Secondary | ICD-10-CM | POA: Diagnosis not present

## 2012-06-10 DIAGNOSIS — M5137 Other intervertebral disc degeneration, lumbosacral region: Secondary | ICD-10-CM | POA: Diagnosis not present

## 2012-06-10 DIAGNOSIS — M79609 Pain in unspecified limb: Secondary | ICD-10-CM | POA: Diagnosis not present

## 2012-06-10 DIAGNOSIS — M48061 Spinal stenosis, lumbar region without neurogenic claudication: Secondary | ICD-10-CM | POA: Diagnosis not present

## 2012-06-26 DIAGNOSIS — M5137 Other intervertebral disc degeneration, lumbosacral region: Secondary | ICD-10-CM | POA: Diagnosis not present

## 2012-07-10 DIAGNOSIS — M48061 Spinal stenosis, lumbar region without neurogenic claudication: Secondary | ICD-10-CM | POA: Diagnosis not present

## 2012-07-10 DIAGNOSIS — M79609 Pain in unspecified limb: Secondary | ICD-10-CM | POA: Diagnosis not present

## 2012-07-10 DIAGNOSIS — M5137 Other intervertebral disc degeneration, lumbosacral region: Secondary | ICD-10-CM | POA: Diagnosis not present

## 2012-07-10 DIAGNOSIS — M545 Low back pain, unspecified: Secondary | ICD-10-CM | POA: Diagnosis not present

## 2012-07-29 DIAGNOSIS — M25579 Pain in unspecified ankle and joints of unspecified foot: Secondary | ICD-10-CM | POA: Diagnosis not present

## 2012-07-31 DIAGNOSIS — M545 Low back pain, unspecified: Secondary | ICD-10-CM | POA: Diagnosis not present

## 2012-08-14 DIAGNOSIS — M5137 Other intervertebral disc degeneration, lumbosacral region: Secondary | ICD-10-CM | POA: Diagnosis not present

## 2012-08-14 DIAGNOSIS — M545 Low back pain, unspecified: Secondary | ICD-10-CM | POA: Diagnosis not present

## 2012-08-14 DIAGNOSIS — M79609 Pain in unspecified limb: Secondary | ICD-10-CM | POA: Diagnosis not present

## 2012-08-14 DIAGNOSIS — M48061 Spinal stenosis, lumbar region without neurogenic claudication: Secondary | ICD-10-CM | POA: Diagnosis not present

## 2012-09-04 DIAGNOSIS — M48061 Spinal stenosis, lumbar region without neurogenic claudication: Secondary | ICD-10-CM | POA: Diagnosis not present

## 2012-09-04 DIAGNOSIS — M545 Low back pain, unspecified: Secondary | ICD-10-CM | POA: Diagnosis not present

## 2012-09-04 DIAGNOSIS — M47817 Spondylosis without myelopathy or radiculopathy, lumbosacral region: Secondary | ICD-10-CM | POA: Diagnosis not present

## 2012-09-04 DIAGNOSIS — M5137 Other intervertebral disc degeneration, lumbosacral region: Secondary | ICD-10-CM | POA: Diagnosis not present

## 2012-09-04 DIAGNOSIS — M4310 Spondylolisthesis, site unspecified: Secondary | ICD-10-CM | POA: Diagnosis not present

## 2012-09-10 ENCOUNTER — Other Ambulatory Visit: Payer: Self-pay | Admitting: Neurosurgery

## 2012-09-10 DIAGNOSIS — M5416 Radiculopathy, lumbar region: Secondary | ICD-10-CM

## 2012-09-16 ENCOUNTER — Ambulatory Visit
Admission: RE | Admit: 2012-09-16 | Discharge: 2012-09-16 | Disposition: A | Discharge status: Home / Self Care | Payer: Medicare Other | Source: Ambulatory Visit | Attending: Neurosurgery | Admitting: Neurosurgery

## 2012-09-16 DIAGNOSIS — Z780 Asymptomatic menopausal state: Secondary | ICD-10-CM | POA: Diagnosis not present

## 2012-09-16 DIAGNOSIS — M5416 Radiculopathy, lumbar region: Secondary | ICD-10-CM

## 2012-09-24 DIAGNOSIS — M48061 Spinal stenosis, lumbar region without neurogenic claudication: Secondary | ICD-10-CM | POA: Diagnosis not present

## 2012-09-26 ENCOUNTER — Other Ambulatory Visit: Payer: Self-pay | Admitting: Neurosurgery

## 2012-10-09 ENCOUNTER — Encounter (HOSPITAL_COMMUNITY): Payer: Self-pay | Admitting: Pharmacy Technician

## 2012-10-15 ENCOUNTER — Ambulatory Visit (HOSPITAL_COMMUNITY)
Admission: RE | Admit: 2012-10-15 | Discharge: 2012-10-15 | Disposition: A | Discharge status: Home / Self Care | Payer: Medicare Other | Source: Ambulatory Visit | Attending: Anesthesiology | Admitting: Anesthesiology

## 2012-10-15 ENCOUNTER — Encounter (HOSPITAL_COMMUNITY)
Admission: RE | Admit: 2012-10-15 | Discharge: 2012-10-15 | Disposition: A | Discharge status: Home / Self Care | Payer: Medicare Other | Source: Ambulatory Visit | Attending: Neurosurgery | Admitting: Neurosurgery

## 2012-10-15 ENCOUNTER — Encounter (HOSPITAL_COMMUNITY): Payer: Self-pay

## 2012-10-15 DIAGNOSIS — Z01818 Encounter for other preprocedural examination: Secondary | ICD-10-CM | POA: Diagnosis not present

## 2012-10-15 DIAGNOSIS — Z01812 Encounter for preprocedural laboratory examination: Secondary | ICD-10-CM | POA: Diagnosis not present

## 2012-10-15 HISTORY — DX: Nausea with vomiting, unspecified: Z98.890

## 2012-10-15 HISTORY — DX: Other specified postprocedural states: R11.2

## 2012-10-15 LAB — BASIC METABOLIC PANEL
GFR calc Af Amer: 47 mL/min — ABNORMAL LOW (ref >90)
GFR calc non Af Amer: 41 mL/min — ABNORMAL LOW (ref >90)
Glucose, Bld: 92 mg/dL (ref 70–99)
Potassium: 4 mEq/L (ref 3.5–5.1)
Sodium: 136 mEq/L (ref 135–145)

## 2012-10-15 LAB — CBC
Hemoglobin: 13 g/dL (ref 12.0–15.0)
MCH: 30.1 pg (ref 26.0–34.0)
Platelets: 301 10*3/uL (ref 150–400)
RBC: 4.32 MIL/uL (ref 3.87–5.11)
WBC: 7.7 10*3/uL (ref 4.0–10.5)

## 2012-10-15 LAB — TYPE AND SCREEN
ABO/RH(D): A POS
Antibody Screen: NEGATIVE

## 2012-10-15 LAB — ABO/RH: ABO/RH(D): A POS

## 2012-10-15 LAB — SURGICAL PCR SCREEN
MRSA, PCR: NEGATIVE
Staphylococcus aureus: NEGATIVE

## 2012-10-15 NOTE — Pre-Procedure Instructions (Signed)
[facility]  10/15/2012   Your procedure is scheduled on:  Oct 23, 2012  Report to [facility] at 6:00 AM.  Call this number if you have problems the morning of surgery: [phone]   Remember:   Do not eat food or drink liquids after midnight.   Take these medicines the morning of surgery with A SIP OF WATER: prilosec, pain pill           STOP ASPIRIN AND PLAVIX AS DIRECTED   Do not wear jewelry, make-up or nail polish.  Do not wear lotions, powders, or perfumes. You may wear deodorant.  Do not shave 48 hours prior to surgery. Men may shave face and neck.  Do not bring valuables to the hospital.  Contacts, dentures or bridgework may not be worn into surgery.  Leave suitcase in the car. After surgery it may be brought to your room.  For patients admitted to the hospital, checkout time is 11:00 AM the day of  discharge.   Patients discharged the day of surgery will not be allowed to drive  home.  Name and phone number of your driver:   Special Instructions: Shower using CHG 2 nights before surgery and the night before surgery.  If you shower the day of surgery use CHG.  Use special wash - you have one bottle of CHG for all showers.  You should use approximately 1/3 of the bottle for each shower.   Please read over the following fact sheets that you were given: Pain Booklet, Coughing and Deep Breathing, Blood Transfusion Information and Surgical Site Infection Prevention

## 2012-10-16 DIAGNOSIS — Z85828 Personal history of other malignant neoplasm of skin: Secondary | ICD-10-CM | POA: Diagnosis not present

## 2012-10-16 DIAGNOSIS — C44320 Squamous cell carcinoma of skin of unspecified parts of face: Secondary | ICD-10-CM | POA: Diagnosis not present

## 2012-10-16 DIAGNOSIS — L821 Other seborrheic keratosis: Secondary | ICD-10-CM | POA: Diagnosis not present

## 2012-10-16 DIAGNOSIS — D485 Neoplasm of uncertain behavior of skin: Secondary | ICD-10-CM | POA: Diagnosis not present

## 2012-10-22 DIAGNOSIS — M79609 Pain in unspecified limb: Secondary | ICD-10-CM | POA: Diagnosis not present

## 2012-10-22 MED ORDER — CEFAZOLIN SODIUM-DEXTROSE 2-3 GM-% IV SOLR
2.0000 g | INTRAVENOUS | Status: AC
  Administered 2012-10-23: 2 g via INTRAVENOUS
  Administered 2012-10-23: 1 g via INTRAVENOUS
  Filled 2012-10-22: qty 50

## 2012-10-23 ENCOUNTER — Inpatient Hospital Stay (HOSPITAL_COMMUNITY)
Admission: RE | Admit: 2012-10-23 | Discharge: 2012-10-24 | DRG: 460 | Disposition: A | Discharge status: Home / Self Care | Payer: Medicare Other | Source: Ambulatory Visit | Attending: Neurosurgery | Admitting: Neurosurgery

## 2012-10-23 ENCOUNTER — Inpatient Hospital Stay (HOSPITAL_COMMUNITY): Payer: Medicare Other

## 2012-10-23 ENCOUNTER — Encounter (HOSPITAL_COMMUNITY): Payer: Self-pay | Admitting: Surgery

## 2012-10-23 ENCOUNTER — Encounter (HOSPITAL_COMMUNITY): Payer: Self-pay | Admitting: Vascular Surgery

## 2012-10-23 ENCOUNTER — Encounter (HOSPITAL_COMMUNITY)
Admission: RE | Disposition: A | Discharge status: Home / Self Care | Payer: Self-pay | Source: Ambulatory Visit | Attending: Neurosurgery

## 2012-10-23 ENCOUNTER — Inpatient Hospital Stay (HOSPITAL_COMMUNITY): Payer: Medicare Other | Admitting: Anesthesiology

## 2012-10-23 DIAGNOSIS — M539 Dorsopathy, unspecified: Secondary | ICD-10-CM | POA: Diagnosis not present

## 2012-10-23 DIAGNOSIS — M5137 Other intervertebral disc degeneration, lumbosacral region: Secondary | ICD-10-CM | POA: Diagnosis not present

## 2012-10-23 DIAGNOSIS — Z8249 Family history of ischemic heart disease and other diseases of the circulatory system: Secondary | ICD-10-CM

## 2012-10-23 DIAGNOSIS — E785 Hyperlipidemia, unspecified: Secondary | ICD-10-CM | POA: Diagnosis present

## 2012-10-23 DIAGNOSIS — M48061 Spinal stenosis, lumbar region without neurogenic claudication: Secondary | ICD-10-CM | POA: Diagnosis not present

## 2012-10-23 DIAGNOSIS — I129 Hypertensive chronic kidney disease with stage 1 through stage 4 chronic kidney disease, or unspecified chronic kidney disease: Secondary | ICD-10-CM | POA: Diagnosis not present

## 2012-10-23 DIAGNOSIS — Z79899 Other long term (current) drug therapy: Secondary | ICD-10-CM

## 2012-10-23 DIAGNOSIS — Z7902 Long term (current) use of antithrombotics/antiplatelets: Secondary | ICD-10-CM

## 2012-10-23 DIAGNOSIS — M51379 Other intervertebral disc degeneration, lumbosacral region without mention of lumbar back pain or lower extremity pain: Secondary | ICD-10-CM | POA: Diagnosis present

## 2012-10-23 DIAGNOSIS — Z833 Family history of diabetes mellitus: Secondary | ICD-10-CM | POA: Diagnosis not present

## 2012-10-23 DIAGNOSIS — M47817 Spondylosis without myelopathy or radiculopathy, lumbosacral region: Secondary | ICD-10-CM | POA: Diagnosis not present

## 2012-10-23 DIAGNOSIS — IMO0002 Reserved for concepts with insufficient information to code with codable children: Secondary | ICD-10-CM | POA: Diagnosis not present

## 2012-10-23 DIAGNOSIS — N183 Chronic kidney disease, stage 3 unspecified: Secondary | ICD-10-CM | POA: Diagnosis present

## 2012-10-23 DIAGNOSIS — Q762 Congenital spondylolisthesis: Secondary | ICD-10-CM

## 2012-10-23 DIAGNOSIS — Z7982 Long term (current) use of aspirin: Secondary | ICD-10-CM

## 2012-10-23 DIAGNOSIS — M4310 Spondylolisthesis, site unspecified: Secondary | ICD-10-CM | POA: Diagnosis not present

## 2012-10-23 DIAGNOSIS — I70219 Atherosclerosis of native arteries of extremities with intermittent claudication, unspecified extremity: Secondary | ICD-10-CM | POA: Diagnosis present

## 2012-10-23 SURGERY — POSTERIOR LUMBAR FUSION 1 LEVEL
Anesthesia: General | Site: Back | Wound class: Clean

## 2012-10-23 MED ORDER — PANTOPRAZOLE SODIUM 40 MG PO TBEC
40.0000 mg | DELAYED_RELEASE_TABLET | Freq: Every day | ORAL | Status: DC
  Administered 2012-10-24: 40 mg via ORAL
  Filled 2012-10-23: qty 1

## 2012-10-23 MED ORDER — HYDROMORPHONE HCL PF 1 MG/ML IJ SOLN
INTRAMUSCULAR | Status: AC
  Filled 2012-10-23: qty 1

## 2012-10-23 MED ORDER — MORPHINE SULFATE 4 MG/ML IJ SOLN: 4.0000 mg | INTRAMUSCULAR | Status: DC | PRN

## 2012-10-23 MED ORDER — ACETAMINOPHEN 10 MG/ML IV SOLN
INTRAVENOUS | Status: AC
  Administered 2012-10-23: 1000 mg via INTRAVENOUS
  Filled 2012-10-23: qty 100

## 2012-10-23 MED ORDER — MEPERIDINE HCL 25 MG/ML IJ SOLN: 6.2500 mg | INTRAMUSCULAR | Status: DC | PRN

## 2012-10-23 MED ORDER — ONDANSETRON HCL 4 MG/2ML IJ SOLN: 4.0000 mg | Freq: Once | INTRAMUSCULAR | Status: DC | PRN

## 2012-10-23 MED ORDER — MAGNESIUM HYDROXIDE 400 MG/5ML PO SUSP: 30.0000 mL | Freq: Every day | ORAL | Status: DC | PRN

## 2012-10-23 MED ORDER — CEFAZOLIN SODIUM 1-5 GM-% IV SOLN
INTRAVENOUS | Status: AC
  Filled 2012-10-23: qty 50

## 2012-10-23 MED ORDER — ROCURONIUM BROMIDE 100 MG/10ML IV SOLN
INTRAVENOUS | Status: DC | PRN
  Administered 2012-10-23: 50 mg via INTRAVENOUS
  Administered 2012-10-23 (×2): 10 mg via INTRAVENOUS

## 2012-10-23 MED ORDER — SODIUM CHLORIDE 0.9 % IV SOLN
INTRAVENOUS | Status: AC
  Filled 2012-10-23: qty 500

## 2012-10-23 MED ORDER — NEOSTIGMINE METHYLSULFATE 1 MG/ML IJ SOLN
INTRAMUSCULAR | Status: DC | PRN
  Administered 2012-10-23: 3 mg via INTRAVENOUS

## 2012-10-23 MED ORDER — PROPOFOL 10 MG/ML IV BOLUS
INTRAVENOUS | Status: DC | PRN
  Administered 2012-10-23: 110 mg via INTRAVENOUS

## 2012-10-23 MED ORDER — BUPIVACAINE HCL (PF) 0.5 % IJ SOLN
INTRAMUSCULAR | Status: DC | PRN
  Administered 2012-10-23: 10 mL

## 2012-10-23 MED ORDER — PHENOL 1.4 % MT LIQD: 1.0000 | OROMUCOSAL | Status: DC | PRN

## 2012-10-23 MED ORDER — LIDOCAINE HCL (CARDIAC) 20 MG/ML IV SOLN
INTRAVENOUS | Status: DC | PRN
  Administered 2012-10-23: 50 mg via INTRAVENOUS

## 2012-10-23 MED ORDER — ZOLPIDEM TARTRATE 5 MG PO TABS: 5.0000 mg | ORAL_TABLET | Freq: Every evening | ORAL | Status: DC | PRN

## 2012-10-23 MED ORDER — KETOROLAC TROMETHAMINE 30 MG/ML IJ SOLN
15.0000 mg | Freq: Once | INTRAMUSCULAR | Status: AC
  Administered 2012-10-23: 15 mg via INTRAVENOUS

## 2012-10-23 MED ORDER — HYDROCHLOROTHIAZIDE 25 MG PO TABS
25.0000 mg | ORAL_TABLET | Freq: Every day | ORAL | Status: DC
  Administered 2012-10-24: 25 mg via ORAL
  Filled 2012-10-23 (×2): qty 1

## 2012-10-23 MED ORDER — CYCLOBENZAPRINE HCL 10 MG PO TABS: 10.0000 mg | ORAL_TABLET | Freq: Three times a day (TID) | ORAL | Status: DC | PRN

## 2012-10-23 MED ORDER — KCL IN DEXTROSE-NACL 20-5-0.45 MEQ/L-%-% IV SOLN
INTRAVENOUS | Status: DC
  Filled 2012-10-23 (×5): qty 1000

## 2012-10-23 MED ORDER — ACETAMINOPHEN 650 MG RE SUPP: 650.0000 mg | RECTAL | Status: DC | PRN

## 2012-10-23 MED ORDER — ACETAMINOPHEN 10 MG/ML IV SOLN
1000.0000 mg | Freq: Four times a day (QID) | INTRAVENOUS | Status: AC
  Administered 2012-10-23 – 2012-10-24 (×4): 1000 mg via INTRAVENOUS
  Filled 2012-10-23 (×7): qty 100

## 2012-10-23 MED ORDER — SODIUM CHLORIDE 0.9 % IV SOLN: 250.0000 mL | INTRAVENOUS | Status: DC

## 2012-10-23 MED ORDER — KETOROLAC TROMETHAMINE 30 MG/ML IJ SOLN
INTRAMUSCULAR | Status: AC
  Filled 2012-10-23: qty 1

## 2012-10-23 MED ORDER — ALUM & MAG HYDROXIDE-SIMETH 200-200-20 MG/5ML PO SUSP: 30.0000 mL | Freq: Four times a day (QID) | ORAL | Status: DC | PRN

## 2012-10-23 MED ORDER — FENTANYL CITRATE 0.05 MG/ML IJ SOLN
INTRAMUSCULAR | Status: DC | PRN
  Administered 2012-10-23: 100 ug via INTRAVENOUS
  Administered 2012-10-23 (×2): 50 ug via INTRAVENOUS

## 2012-10-23 MED ORDER — ALPRAZOLAM 0.25 MG PO TABS
0.2500 mg | ORAL_TABLET | Freq: Every evening | ORAL | Status: DC | PRN
  Administered 2012-10-23: 0.25 mg via ORAL
  Filled 2012-10-23: qty 1

## 2012-10-23 MED ORDER — SODIUM CHLORIDE 0.9 % IJ SOLN: 3.0000 mL | INTRAMUSCULAR | Status: DC | PRN

## 2012-10-23 MED ORDER — OXYCODONE HCL 5 MG/5ML PO SOLN: 5.0000 mg | Freq: Once | ORAL | Status: DC | PRN

## 2012-10-23 MED ORDER — HYDROMORPHONE HCL PF 1 MG/ML IJ SOLN
0.2500 mg | INTRAMUSCULAR | Status: DC | PRN
  Administered 2012-10-23 (×3): 0.5 mg via INTRAVENOUS

## 2012-10-23 MED ORDER — OXYCODONE HCL 5 MG PO TABS: 5.0000 mg | ORAL_TABLET | Freq: Once | ORAL | Status: DC | PRN

## 2012-10-23 MED ORDER — THROMBIN 20000 UNITS EX KIT
PACK | CUTANEOUS | Status: DC | PRN
  Administered 2012-10-23: 20000 [IU] via TOPICAL

## 2012-10-23 MED ORDER — GLYCOPYRROLATE 0.2 MG/ML IJ SOLN
INTRAMUSCULAR | Status: DC | PRN
  Administered 2012-10-23: 0.6 mg via INTRAVENOUS

## 2012-10-23 MED ORDER — SODIUM CHLORIDE 0.9 % IJ SOLN
3.0000 mL | Freq: Two times a day (BID) | INTRAMUSCULAR | Status: DC
  Administered 2012-10-24 (×2): 3 mL via INTRAVENOUS

## 2012-10-23 MED ORDER — KETOROLAC TROMETHAMINE 30 MG/ML IJ SOLN
15.0000 mg | Freq: Four times a day (QID) | INTRAMUSCULAR | Status: DC
  Administered 2012-10-23: 17:00:00 via INTRAVENOUS
  Administered 2012-10-24 (×3): 15 mg via INTRAVENOUS
  Filled 2012-10-23 (×7): qty 1

## 2012-10-23 MED ORDER — OXYCODONE HCL 5 MG PO TABS
5.0000 mg | ORAL_TABLET | ORAL | Status: DC | PRN
  Administered 2012-10-23: 5 mg via ORAL
  Filled 2012-10-23: qty 1

## 2012-10-23 MED ORDER — LIDOCAINE-EPINEPHRINE 1 %-1:100000 IJ SOLN
INTRAMUSCULAR | Status: DC | PRN
  Administered 2012-10-23: 10 mL

## 2012-10-23 MED ORDER — HYDROXYZINE HCL 25 MG PO TABS: 50.0000 mg | ORAL_TABLET | ORAL | Status: DC | PRN

## 2012-10-23 MED ORDER — EPHEDRINE SULFATE 50 MG/ML IJ SOLN
INTRAMUSCULAR | Status: DC | PRN
  Administered 2012-10-23: 10 mg via INTRAVENOUS

## 2012-10-23 MED ORDER — DEXAMETHASONE SODIUM PHOSPHATE 10 MG/ML IJ SOLN
INTRAMUSCULAR | Status: DC | PRN
  Administered 2012-10-23: 4 mg via INTRAVENOUS

## 2012-10-23 MED ORDER — ONDANSETRON HCL 4 MG/2ML IJ SOLN
INTRAMUSCULAR | Status: DC | PRN
  Administered 2012-10-23: 4 mg via INTRAVENOUS

## 2012-10-23 MED ORDER — KCL IN DEXTROSE-NACL 20-5-0.45 MEQ/L-%-% IV SOLN
INTRAVENOUS | Status: AC
  Filled 2012-10-23: qty 1000

## 2012-10-23 MED ORDER — BACITRACIN 50000 UNITS IM SOLR
INTRAMUSCULAR | Status: AC
  Filled 2012-10-23: qty 1

## 2012-10-23 MED ORDER — MENTHOL 3 MG MT LOZG: 1.0000 | LOZENGE | OROMUCOSAL | Status: DC | PRN

## 2012-10-23 MED ORDER — LACTATED RINGERS IV SOLN
INTRAVENOUS | Status: DC | PRN
  Administered 2012-10-23 (×2): via INTRAVENOUS

## 2012-10-23 MED ORDER — HEMOSTATIC AGENTS (NO CHARGE) OPTIME
TOPICAL | Status: DC | PRN
  Administered 2012-10-23: 1 via TOPICAL

## 2012-10-23 MED ORDER — BISACODYL 10 MG RE SUPP: 10.0000 mg | Freq: Every day | RECTAL | Status: DC | PRN

## 2012-10-23 MED ORDER — ACETAMINOPHEN 325 MG PO TABS: 650.0000 mg | ORAL_TABLET | ORAL | Status: DC | PRN

## 2012-10-23 MED ORDER — 0.9 % SODIUM CHLORIDE (POUR BTL) OPTIME
TOPICAL | Status: DC | PRN
  Administered 2012-10-23: 1000 mL

## 2012-10-23 MED ORDER — SODIUM CHLORIDE 0.9 % IR SOLN
Status: DC | PRN
  Administered 2012-10-23: 09:00:00

## 2012-10-23 SURGICAL SUPPLY — 69 items
BAG DECANTER FOR FLEXI CONT (MISCELLANEOUS) ×2 IMPLANT
BLADE SURG ROTATE 9660 (MISCELLANEOUS) IMPLANT
BRUSH SCRUB EZ PLAIN DRY (MISCELLANEOUS) ×2 IMPLANT
BUR ACRON 5.0MM COATED (BURR) ×2 IMPLANT
BUR MATCHSTICK NEURO 3.0 LAGG (BURR) ×2 IMPLANT
CANISTER SUCTION 2500CC (MISCELLANEOUS) IMPLANT
CAP LCK SPNE (Orthopedic Implant) ×4 IMPLANT
CAP LOCK SPINE RADIUS (Orthopedic Implant) ×4 IMPLANT
CAP LOCKING (Orthopedic Implant) ×4 IMPLANT
CLOTH BEACON ORANGE TIMEOUT ST (SAFETY) ×2 IMPLANT
CONT SPEC 4OZ CLIKSEAL STRL BL (MISCELLANEOUS) ×2 IMPLANT
COVER BACK TABLE 24X17X13 BIG (DRAPES) IMPLANT
COVER TABLE BACK 60X90 (DRAPES) ×2 IMPLANT
DERMABOND ADHESIVE PROPEN (GAUZE/BANDAGES/DRESSINGS) ×1
DERMABOND ADVANCED (GAUZE/BANDAGES/DRESSINGS) ×2
DERMABOND ADVANCED .7 DNX12 (GAUZE/BANDAGES/DRESSINGS) ×2 IMPLANT
DERMABOND ADVANCED .7 DNX6 (GAUZE/BANDAGES/DRESSINGS) ×1 IMPLANT
DRAPE C-ARM 42X72 X-RAY (DRAPES) ×4 IMPLANT
DRAPE LAPAROTOMY 100X72X124 (DRAPES) ×2 IMPLANT
DRAPE POUCH INSTRU U-SHP 10X18 (DRAPES) ×2 IMPLANT
DRAPE PROXIMA HALF (DRAPES) ×2 IMPLANT
DRSG EMULSION OIL 3X3 NADH (GAUZE/BANDAGES/DRESSINGS) IMPLANT
ELECT REM PT RETURN 9FT ADLT (ELECTROSURGICAL) ×2
ELECTRODE REM PT RTRN 9FT ADLT (ELECTROSURGICAL) ×1 IMPLANT
GAUZE SPONGE 4X4 16PLY XRAY LF (GAUZE/BANDAGES/DRESSINGS) IMPLANT
GLOVE BIOGEL PI IND STRL 8 (GLOVE) ×2 IMPLANT
GLOVE BIOGEL PI INDICATOR 8 (GLOVE) ×2
GLOVE ECLIPSE 7.5 STRL STRAW (GLOVE) ×4 IMPLANT
GLOVE EXAM NITRILE LRG STRL (GLOVE) IMPLANT
GLOVE EXAM NITRILE MD LF STRL (GLOVE) ×4 IMPLANT
GLOVE EXAM NITRILE XL STR (GLOVE) IMPLANT
GLOVE EXAM NITRILE XS STR PU (GLOVE) IMPLANT
GOWN BRE IMP SLV AUR LG STRL (GOWN DISPOSABLE) ×2 IMPLANT
GOWN BRE IMP SLV AUR XL STRL (GOWN DISPOSABLE) ×4 IMPLANT
GOWN STRL REIN 2XL LVL4 (GOWN DISPOSABLE) IMPLANT
KIT BASIN OR (CUSTOM PROCEDURE TRAY) ×2 IMPLANT
KIT INFUSE SMALL (Orthopedic Implant) ×2 IMPLANT
KIT ROOM TURNOVER OR (KITS) ×2 IMPLANT
MILL MEDIUM DISP (BLADE) ×2 IMPLANT
NEEDLE BONE MARROW 8GAX6 (NEEDLE) ×2 IMPLANT
NEEDLE SPNL 18GX3.5 QUINCKE PK (NEEDLE) IMPLANT
NEEDLE SPNL 22GX3.5 QUINCKE BK (NEEDLE) ×2 IMPLANT
NS IRRIG 1000ML POUR BTL (IV SOLUTION) ×2 IMPLANT
PACK LAMINECTOMY NEURO (CUSTOM PROCEDURE TRAY) ×2 IMPLANT
PAD ARMBOARD 7.5X6 YLW CONV (MISCELLANEOUS) ×6 IMPLANT
PATTIES SURGICAL .5 X.5 (GAUZE/BANDAGES/DRESSINGS) IMPLANT
PATTIES SURGICAL .5 X1 (DISPOSABLE) IMPLANT
PATTIES SURGICAL 1X1 (DISPOSABLE) IMPLANT
PEEK PLIF AVS 10X25X4 (Peek) ×2 IMPLANT
PEEK PLIF AVS 9X25X4 (Peek) ×2 IMPLANT
ROD RADIUS 35MM (Rod) ×4 IMPLANT
SCREW 5.75 X 635 (Screw) ×4 IMPLANT
SCREW 5.75X40M (Screw) ×4 IMPLANT
SPONGE GAUZE 4X4 12PLY (GAUZE/BANDAGES/DRESSINGS) IMPLANT
SPONGE LAP 4X18 X RAY DECT (DISPOSABLE) IMPLANT
SPONGE NEURO XRAY DETECT 1X3 (DISPOSABLE) IMPLANT
SPONGE SURGIFOAM ABS GEL 100 (HEMOSTASIS) ×2 IMPLANT
STRIP BIOACTIVE VITOSS 25X100X (Neuro Prosthesis/Implant) ×2 IMPLANT
STRIP BIOACTIVE VITOSS 25X52X4 (Orthopedic Implant) ×2 IMPLANT
SUT VIC AB 1 CT1 18XBRD ANBCTR (SUTURE) ×2 IMPLANT
SUT VIC AB 1 CT1 8-18 (SUTURE) ×2
SUT VIC AB 2-0 CP2 18 (SUTURE) ×4 IMPLANT
SYR 20ML ECCENTRIC (SYRINGE) ×2 IMPLANT
SYR CONTROL 10ML LL (SYRINGE) ×2 IMPLANT
TAPE CLOTH SURG 4X10 WHT LF (GAUZE/BANDAGES/DRESSINGS) ×2 IMPLANT
TOWEL OR 17X24 6PK STRL BLUE (TOWEL DISPOSABLE) ×2 IMPLANT
TOWEL OR 17X26 10 PK STRL BLUE (TOWEL DISPOSABLE) ×2 IMPLANT
TRAY FOLEY CATH 14FRSI W/METER (CATHETERS) ×2 IMPLANT
WATER STERILE IRR 1000ML POUR (IV SOLUTION) ×2 IMPLANT

## 2012-10-23 NOTE — Anesthesia Postprocedure Evaluation (Signed)
Anesthesia Post Note  Patient: Jennifer Atkinson  Procedure(s) Performed: Procedure(s) (LRB): POSTERIOR LUMBAR FUSION 1 LEVEL (N/A)  Anesthesia type: general  Patient location: PACU  Post pain: Pain level controlled  Post assessment: Patient's Cardiovascular Status Stable  Last Vitals:  Filed Vitals:   10/23/12 1350  BP: 130/59  Pulse: 56  Temp:   Resp: 16    Post vital signs: Reviewed and stable  Level of consciousness: sedated  Complications: No apparent anesthesia complications

## 2012-10-23 NOTE — Transfer of Care (Signed)
Immediate Anesthesia Transfer of Care Note  Patient: Jennifer Atkinson  Procedure(s) Performed: Procedure(s) with comments: POSTERIOR LUMBAR FUSION 1 LEVEL (N/A) - L45 decompression with posterior lumbar interbody fusion with interbody prothesis posterolateral arthrodesis and posterior nonsegmental instrumentation, CARM  Patient Location: PACU  Anesthesia Type:General  Level of Consciousness: awake and alert   Airway & Oxygen Therapy: Patient Spontanous Breathing and Patient connected to nasal cannula oxygen  Post-op Assessment: Report given to PACU RN and Post -op Vital signs reviewed and stable  Post vital signs: Reviewed and stable  Complications: No apparent anesthesia complications

## 2012-10-23 NOTE — Op Note (Signed)
10/23/2012  12:11 PM  PATIENT:  Jennifer Atkinson  79 y.o. female  PRE-OPERATIVE DIAGNOSIS:  L4-5 spondylolisthesis, lumbar stenosis, lumbar spondylosis, lumbar degenerative disc disease  POST-OPERATIVE DIAGNOSIS: L4-5 spondylolisthesis, lumbar stenosis, lumbar spondylosis, lumbar degenerative disc disease   PROCEDURE:  Procedure(s): POSTERIOR LUMBAR FUSION 1 LEVEL:  Bilateral L4-5 lumbar decompression including bilateral lumbar laminectomy, facetectomy, and foraminotomy, with decompression of the exiting L4 and L5 nerve roots bilaterally, with microdissection and microsurgical technique, with decompression beyond that required for interbody arthrodesis; bilateral L4-5 posterior lumbar interbody arthrodesis with AVS peek interbody implants and Vitoss BA with bone marrow aspirate, and infuse; bilateral L4-5 posterolateral arthrodesis with radius posterior instrumentation, Vitoss BA with bone marrow aspirate, and infuse  SURGEON:  Surgeon(s): Hewitt Shorts, MD Tia Alert, MD  ASSISTANTS: Marikay Alar, M.D.  ANESTHESIA:   general  EBL:  Total I/O In: 1400 [I.V.:1400] Out: 520 [Urine:370; Blood:150]  BLOOD ADMINISTERED:none  COUNT: Correct per nursing staff  DICTATION:  Patient is brought to the operating room placed under general endotracheal anesthesia. The patient was turned to prone position the lumbar region was prepped with Betadine soap and solution and draped in a sterile fashion. The midline was infiltrated with local anesthesia with epinephrine. A localizing x-ray was taken and then a midline incision was made carried down through the subcutaneous tissue, bipolar cautery and electrocautery were used to maintain hemostasis. Dissection was carried down to the lumbar fascia. The fascia was incised bilaterally and the paraspinal muscles were dissected with a spinous process and lamina in a subperiosteal fashion. Another x-ray was taken for localization and the L4-5 level was  localized. Dissection was then carried out laterally over the facet complex, using the high-speed drill a bilateral L4-5 facetectomy was performed, and then the transverse processes of L4 and L5 were exposed and decorticated. Decompression was done by performing a bilateral L4 and L5 laminectomy, completing the bilateral L4-5 facetectomy, and performing a bilateral foraminotomies for the exiting L4 and L5 nerve roots. There was severe stenotic compression of the nerve roots as they exited through the neural foramina, worse on the right than the left. Once the decompression stenotic compression of the thecal sac and exiting nerve roots was completed we proceeded with the posterior lumbar interbody arthrodesis. The annulus was incised bilaterally and the disc space entered. A thorough discectomy was performed using pituitary rongeurs and curettes. Once the discectomy was completed we began to prepare the endplate surfaces removing the cartilaginous endplates surface. We then measured the height of the intervertebral disc space. We selected a 10 x 25 x 4 AVS peek interbody implant for the left side, and a 9 x 25 x 4 AVS peek interbody implant for the right side.  The C-arm fluoroscope was then draped and brought in the field and we identified the pedicle entry points bilaterally at the L4 and L5 levels. Each of the 4 pedicles was probed, we aspirated bone marrow aspirate from the vertebral bodies, this was injected over a 10 cc strip and a 5 cc strip of Vitoss BA. Then each of the pedicles was examined with the ball probe good bony surfaces were found and no bony cuts were found. Each of the pedicles was then tapped with a 5.25 mm tap, again examined with the ball probe good threading was found and no bony cuts were found. We then placed 5.75 by 35 millimeter screws bilaterally at the L4 level and 5.75 x 40 mm screws bilaterally at the L5 level.  We then packed the AVS peek interbody implants with Vitoss BA with  bone marrow aspirate and infuse, and then placed the first implant (9 mm in height) on the right side, carefully retracting the thecal sac and nerve root medially. We then went back to the left side and packed the midline with additional Vitoss BA with bone marrow aspirate and infuse and then placed a second implant on the left side again retracting the thecal sac and nerve root medially. Additional Vitoss BA with bone marrow aspirate and infuse was packed lateral to the implants.  We then packed the lateral gutter over the transverse processes and intertransverse space with Vitoss BA with bone marrow aspirate and infuse. We then selected a 35 mm pre-lordosed rods, they were placed within the screw heads and secured with locking caps once all 4 locking caps were placed final tightening was performed against a counter torque.  The wound had been irrigated multiple times during the procedure with saline solution and bacitracin solution, good hemostasis was established with a combination of bipolar cautery and Gelfoam with thrombin. Once good hemostasis was confirmed we proceeded with closure paraspinal muscles deep fascia and Scarpa's fascia were closed with interrupted undyed 1 Vicryl sutures the subcutaneous and subcuticular closed with interrupted inverted 2-0 undyed Vicryl sutures the skin edges were approximated with Dermabond.  A dressing of dry gauze and Hypafix was applied.  Following surgery the patient was turned back to the supine position to be reversed and the anesthetic extubated and transferred to the recovery room for further care.  PLAN OF CARE: Admit to inpatient   PATIENT DISPOSITION:  PACU - hemodynamically stable.   Delay start of Pharmacological VTE agent (>24hrs) due to surgical blood loss or risk of bleeding:  yes

## 2012-10-23 NOTE — Anesthesia Procedure Notes (Signed)
Procedure Name: Intubation Date/Time: 10/23/2012 8:43 AM Performed by: Gwenyth Allegra Pre-anesthesia Checklist: Patient identified, Timeout performed, Emergency Drugs available, Suction available and Patient being monitored Patient Re-evaluated:Patient Re-evaluated prior to inductionOxygen Delivery Method: Circle system utilized Preoxygenation: Pre-oxygenation with 100% oxygen Intubation Type: IV induction Ventilation: Mask ventilation without difficulty and Oral airway inserted - appropriate to patient size Laryngoscope Size: Mac and 3 Grade View: Grade I Tube type: Oral Tube size: 7.0 mm Number of attempts: 1 Airway Equipment and Method: Stylet Placement Confirmation: ETT inserted through vocal cords under direct vision,  breath sounds checked- equal and bilateral and positive ETCO2 Secured at: 21 cm Tube secured with: Tape Dental Injury: Teeth and Oropharynx as per pre-operative assessment

## 2012-10-23 NOTE — Progress Notes (Signed)
Filed Vitals:   10/23/12 1245 10/23/12 1322 10/23/12 1350 10/23/12 1640  BP:   130/59 129/69  Pulse: 59  56 71  Temp:  97 F (36.1 C)  97 F (36.1 C)  TempSrc:      Resp: 10  16 16   SpO2: 98%  94% 93%    Sitting up, having dinner. Comfortable. To walk in the hall after dinner, we'll discontinue of Foley catheter after she ambulates. Dressing clean and dry. Moving all 4 extremities well.  Plan: Doing well following surgery, we'll continue to progress the postoperative recovery.  Hewitt Shorts, MD 10/23/2012, 5:35 PM

## 2012-10-23 NOTE — Progress Notes (Signed)
UR COMPLETED  

## 2012-10-23 NOTE — Anesthesia Preprocedure Evaluation (Addendum)
Anesthesia Evaluation  Patient identified by MRN, date of birth, ID band Patient awake    Reviewed: Allergy & Precautions, NPO status , Patient's Chart, lab work & pertinent test results, reviewed documented beta blocker date and time   History of Anesthesia Complications (+) PONV  Airway Mallampati: II TM Distance: >3 FB Neck ROM: Full    Dental   Pulmonary          Cardiovascular hypertension, Pt. on medications + Peripheral Vascular Disease     Neuro/Psych    GI/Hepatic GERD-  Controlled,  Endo/Other  Hypothyroidism   Renal/GU Renal InsufficiencyRenal disease     Musculoskeletal   Abdominal   Peds  Hematology   Anesthesia Other Findings   Reproductive/Obstetrics                         Anesthesia Physical Anesthesia Plan  ASA: III  Anesthesia Plan: General   Post-op Pain Management:    Induction: Intravenous  Airway Management Planned: Oral ETT  Additional Equipment:   Intra-op Plan:   Post-operative Plan: Extubation in OR  Informed Consent: I have reviewed the patients History and Physical, chart, labs and discussed the procedure including the risks, benefits and alternatives for the proposed anesthesia with the patient or authorized representative who has indicated his/her understanding and acceptance.     Plan Discussed with: CRNA and Surgeon  Anesthesia Plan Comments:         Anesthesia Quick Evaluation

## 2012-10-23 NOTE — H&P (Signed)
Subjective: Patient is a 79 y.o. female who is admitted for treatment of a grade II spondylolisthesis at L4-5, with resulting marked multifactorial lumbar stenosis and bilateral lateral recess stenosis, right worse and left. Symptomatically she had discomfort with a right lumbar radiculopathy. She was treated with NSAIDS, but developed renal insufficiency and was told to discontinue them. She underwent for ESI's without relief. X-rays show progression of her spondylolisthesis. She complains of pain in her low back that extends down to the right thigh and calf, with the worst pain being in the right calf. She is admitted now for lumbar decompression and stabilization. Specifically a bilateral L4-5 lumbar laminectomy, facetectomy, and foraminotomy, with bilateral L4-5 posterior lumbar interbody arthrodesis with interbody implants and bone graft and bilateral L4-5 posterior lateral arthrodesis with postreduction patient and bone graft.   Patient Active Problem List   Diagnosis Date Noted  . Claudication 01/25/2012  . PVD, Rt SFA stent this admission 01/25/2012  . Chronic renal disease, stage III 01/25/2012  . HTN (hypertension) 01/25/2012  . Dyslipidemia 01/25/2012   Past Medical History  Diagnosis Date  . Syncopal episodes     WHILE TAKING NIACIN PT STATES FAINTING  . Chronic kidney disease   . Hypertension   . Hyperlipidemia   . SUI (stress urinary incontinence, female)     Recent Macroplastique  . Peripheral vascular disease   . Syncope and collapse 2006-2012    "I have fainted 2-3 times from being real sick;, like sore throat; or RX changes"  . Complication of anesthesia   . PONV (postoperative nausea and vomiting)     Past Surgical History  Procedure Laterality Date  . Peripheral arterial stent graft  01/25/12    right SFA  . Breast surgery      X 2 Right breast ,   benign masses  . Abdominal hysterectomy  1980's  . Orif ankle fracture      right; "had 5 surgeries"  . Fracture  surgery    . Appendectomy  1980's    "w/hysterectomy"    Prescriptions prior to admission  Medication Sig Dispense Refill  . ALPRAZolam (XANAX) 0.25 MG tablet Take 0.25 mg by mouth at bedtime as needed. For sleep      . aspirin 81 MG chewable tablet Chew 81 mg by mouth daily.      . Calcium Carbonate-Vitamin D (CALCIUM-VITAMIN D) 500-200 MG-UNIT per tablet Take 1 tablet by mouth daily.       . clopidogrel (PLAVIX) 75 MG tablet Take 1 tablet (75 mg total) by mouth daily with breakfast.  30 tablet  11  . fish oil-omega-3 fatty acids 1000 MG capsule Take 1 g by mouth daily.       . Glucosamine-Chondroitin (OSTEO BI-FLEX REGULAR STRENGTH PO) Take 1 tablet by mouth daily.       . hydrochlorothiazide 25 MG tablet Take 25 mg by mouth daily.        Marland Kitchen HYDROcodone-acetaminophen (VICODIN) 5-500 MG per tablet Take 1 tablet by mouth every 4 (four) hours as needed. For pain      . Lactobacillus (ACIDOPHILUS) CAPS Take 1 capsule by mouth daily.      Marland Kitchen omeprazole (PRILOSEC) 10 MG capsule Take 10 mg by mouth daily.         No Known Allergies  History  Substance Use Topics  . Smoking status: Never Smoker   . Smokeless tobacco: Never Used  . Alcohol Use: 2.4 oz/week    4 Glasses of wine per  week     Comment: OCCASSIONAL    Family History  Problem Relation Age of Onset  . Diabetes Father   . Hypertension Father      Review of Systems A comprehensive review of systems was negative.  Objective: Vital signs in last 24 hours: Temp:  [96.4 F (35.8 C)] 96.4 F (35.8 C) (02/26 0614) Pulse Rate:  [76] 76 (02/26 0614) Resp:  [18] 18 (02/26 0614) BP: (143)/(70) 143/70 mmHg (02/26 0614) SpO2:  [96 %] 96 % (02/26 0614)  EXAM: Patient well-developed well-nourished white female in no acute distress. Lungs are clear to auscultation , the patient has symmetrical respiratory excursion. Heart has a regular rate and rhythm normal S1 and S2, 1-2/6 murmur.   Abdomen is soft nontender nondistended bowel sounds  are present. Extremity examination shows no clubbing cyanosis or edema. Musculoskeletal examination shows no tenderness to palpation of the lumbar spinous processes or paralumbar musculature. She flexed 90, and able to extend 5-10. Straight leg raising is negative bilaterally. Motor examination shows 5 over 5 strength in the lower extremities including the iliopsoas quadriceps dorsiflexor extensor hallicus  longus and plantar flexor bilaterally. Sensation is intact to pinprick in the distal lower extremities. Reflexes show the left quadriceps is trace, the right quadriceps is trace to one, the gastrocnemius is minimal to absent bilaterally. No pathologic reflexes are present. Patient has a normal gait and stance.    Data Review:CBC    Component Value Date/Time   WBC 7.7 10/15/2012 1348   RBC 4.32 10/15/2012 1348   HGB 13.0 10/15/2012 1348   HCT 39.0 10/15/2012 1348   PLT 301 10/15/2012 1348   MCV 90.3 10/15/2012 1348   MCH 30.1 10/15/2012 1348   MCHC 33.3 10/15/2012 1348   RDW 13.0 10/15/2012 1348   LYMPHSABS 2.2 03/04/2011 1604   MONOABS 0.5 03/04/2011 1604   EOSABS 0.3 03/04/2011 1604   BASOSABS 0.0 03/04/2011 1604                          BMET    Component Value Date/Time   NA 136 10/15/2012 1348   K 4.0 10/15/2012 1348   CL 97 10/15/2012 1348   CO2 30 10/15/2012 1348   GLUCOSE 92 10/15/2012 1348   BUN 26* 10/15/2012 1348   CREATININE 1.15* 10/15/2012 1348   CALCIUM 9.5 10/15/2012 1348   GFRNONAA 41* 10/15/2012 1348   GFRAA 47* 10/15/2012 1348     Assessment/Plan: Patient with persistent disabling right lumbar radiculopathy with progressive spinal listhesis at L4-5, with resulting spinal stenosis. She is admitted now for lumbar decompression and stabilization. I've discussed with the patient the nature of his condition, the nature the surgical procedure, the typical length of surgery, hospital stay, and overall recuperation, the limitations postoperatively, and risks of surgery. I discussed risks  including risks of infection, bleeding, possibly need for transfusion, the risk of nerve root dysfunction with pain, weakness, numbness, or paresthesias, the risk of dural tear and CSF leakage and possible need for further surgery, the risk of failure of the arthrodesis and possibly for further surgery, the risk of anesthetic complications including myocardial infarction, stroke, pneumonia, and death. We discussed the need for postoperative immobilization in a lumbar brace. Understanding all this the patient does wish to proceed with surgery and is admitted for such.     Hewitt Shorts, MD 10/23/2012 7:25 AM

## 2012-10-23 NOTE — Preoperative (Signed)
Beta Blockers   Reason not to administer Beta Blockers:Not Applicable 

## 2012-10-24 MED ORDER — HYDROCODONE-ACETAMINOPHEN 5-325 MG PO TABS: 1.0000 | ORAL_TABLET | ORAL | Status: AC | PRN

## 2012-10-24 MED ORDER — HYDROCODONE-ACETAMINOPHEN 5-325 MG PO TABS
1.0000 | ORAL_TABLET | ORAL | Status: DC | PRN
  Administered 2012-10-24: 1 via ORAL
  Filled 2012-10-24: qty 1

## 2012-10-24 MED FILL — Heparin Sodium (Porcine) Inj 1000 Unit/ML: INTRAMUSCULAR | Qty: 30 | Status: AC

## 2012-10-24 MED FILL — Sodium Chloride IV Soln 0.9%: INTRAVENOUS | Qty: 1000 | Status: AC

## 2012-10-24 NOTE — Discharge Summary (Signed)
Physician Discharge Summary  Patient ID: Jennifer Atkinson MRN: [id number] DOB/AGE: November 10, 1919 79 y.o.  Admit date: 10/23/2012 Discharge date: 10/24/2012  Admission Diagnoses:  Lumbar spondylolisthesis, lumbar stenosis, lumbar spondylosis, lumbar degenerative disc disease  Discharge Diagnoses:  Lumbar spondylolisthesis, lumbar stenosis, lumbar spondylosis, lumbar degenerative disc disease  Discharged Condition: good  Hospital Course: Patient was admitted, underwent an L4-5 lumbar decompression, PLIF, and PLA. She is done well following surgery. She is up and ambulate actively. Her wound is healing well. She is voiding well. We are discharging her to home with instructions regarding wound care and activities. She is to return for followup with me in 3 weeks.  Discharge Exam: Blood pressure 103/56, pulse 71, temperature 98 F (36.7 C), temperature source Oral, resp. rate 16, SpO2 94.00%.  Disposition: Home     Medication List    TAKE these medications       Acidophilus Caps  Take 1 capsule by mouth daily.     ALPRAZolam 0.25 MG tablet  Commonly known as:  XANAX  Take 0.25 mg by mouth at bedtime as needed. For sleep     aspirin 81 MG chewable tablet  Chew 81 mg by mouth daily.     calcium-vitamin D 500-200 MG-UNIT per tablet  Take 1 tablet by mouth daily.     clopidogrel 75 MG tablet  Commonly known as:  PLAVIX  Take 1 tablet (75 mg total) by mouth daily with breakfast.     fish oil-omega-3 fatty acids 1000 MG capsule  Take 1 g by mouth daily.     hydrochlorothiazide 25 MG tablet  Commonly known as:  HYDRODIURIL  Take 25 mg by mouth daily.     HYDROcodone-acetaminophen 5-500 MG per tablet  Commonly known as:  VICODIN  Take 1 tablet by mouth every 4 (four) hours as needed. For pain     HYDROcodone-acetaminophen 5-325 MG per tablet  Commonly known as:  NORCO/VICODIN  Take 1-2 tablets by mouth every 4 (four) hours as needed for pain.     omeprazole 10 MG capsule   Commonly known as:  PRILOSEC  Take 10 mg by mouth daily.     OSTEO BI-FLEX REGULAR STRENGTH PO  Take 1 tablet by mouth daily.         Signed: Hewitt Shorts, MD 10/24/2012, 12:14 PM

## 2012-10-24 NOTE — Discharge Instructions (Signed)

## 2012-11-12 DIAGNOSIS — M4310 Spondylolisthesis, site unspecified: Secondary | ICD-10-CM | POA: Diagnosis not present

## 2012-11-27 DIAGNOSIS — D0439 Carcinoma in situ of skin of other parts of face: Secondary | ICD-10-CM | POA: Diagnosis not present

## 2012-11-27 DIAGNOSIS — C44320 Squamous cell carcinoma of skin of unspecified parts of face: Secondary | ICD-10-CM | POA: Diagnosis not present

## 2012-11-27 DIAGNOSIS — D0430 Carcinoma in situ of skin of unspecified part of face: Secondary | ICD-10-CM | POA: Diagnosis not present

## 2012-11-28 ENCOUNTER — Other Ambulatory Visit (HOSPITAL_COMMUNITY): Payer: Self-pay | Admitting: Cardiovascular Disease

## 2012-11-28 DIAGNOSIS — I739 Peripheral vascular disease, unspecified: Secondary | ICD-10-CM

## 2012-12-02 ENCOUNTER — Ambulatory Visit (HOSPITAL_COMMUNITY)
Admission: RE | Admit: 2012-12-02 | Discharge: 2012-12-02 | Disposition: A | Discharge status: Home / Self Care | Payer: Medicare Other | Source: Ambulatory Visit | Attending: Cardiovascular Disease | Admitting: Cardiovascular Disease

## 2012-12-02 DIAGNOSIS — I739 Peripheral vascular disease, unspecified: Secondary | ICD-10-CM | POA: Diagnosis not present

## 2012-12-02 DIAGNOSIS — I70219 Atherosclerosis of native arteries of extremities with intermittent claudication, unspecified extremity: Secondary | ICD-10-CM | POA: Diagnosis not present

## 2012-12-02 HISTORY — PX: OTHER SURGICAL HISTORY: SHX169

## 2012-12-03 DIAGNOSIS — E039 Hypothyroidism, unspecified: Secondary | ICD-10-CM | POA: Diagnosis not present

## 2012-12-03 DIAGNOSIS — F411 Generalized anxiety disorder: Secondary | ICD-10-CM | POA: Diagnosis not present

## 2012-12-03 DIAGNOSIS — K219 Gastro-esophageal reflux disease without esophagitis: Secondary | ICD-10-CM | POA: Diagnosis not present

## 2012-12-03 DIAGNOSIS — M159 Polyosteoarthritis, unspecified: Secondary | ICD-10-CM | POA: Diagnosis not present

## 2012-12-03 DIAGNOSIS — N183 Chronic kidney disease, stage 3 unspecified: Secondary | ICD-10-CM | POA: Diagnosis not present

## 2012-12-03 DIAGNOSIS — I10 Essential (primary) hypertension: Secondary | ICD-10-CM | POA: Diagnosis not present

## 2012-12-03 NOTE — Progress Notes (Signed)
Bilateral lower extremity arterial duplex and ABI completed.  12/02/2012 Marris Frontera, RDMS, RDCS 

## 2012-12-03 NOTE — Progress Notes (Signed)
Arterial Lower Ext. Duplex Completed.

## 2013-01-02 DIAGNOSIS — C44390 Other specified malignant neoplasm of skin of unspecified parts of face: Secondary | ICD-10-CM | POA: Diagnosis not present

## 2013-01-03 DIAGNOSIS — I10 Essential (primary) hypertension: Secondary | ICD-10-CM | POA: Diagnosis not present

## 2013-01-03 DIAGNOSIS — I739 Peripheral vascular disease, unspecified: Secondary | ICD-10-CM | POA: Diagnosis not present

## 2013-01-09 DIAGNOSIS — C44390 Other specified malignant neoplasm of skin of unspecified parts of face: Secondary | ICD-10-CM | POA: Diagnosis not present

## 2013-01-14 DIAGNOSIS — M545 Low back pain, unspecified: Secondary | ICD-10-CM | POA: Diagnosis not present

## 2013-03-10 ENCOUNTER — Telehealth: Payer: Self-pay | Admitting: Cardiovascular Disease

## 2013-03-10 NOTE — Telephone Encounter (Signed)
Returned call.  Pt with c/o R foot and ankle pain w/ intermittent swelling.  Pain 6/10 now and was worse this morning when she got up.  Stated she could hardly walk.  Stated she is going to the beach next week and wanted to see Dr. Allyson Sabal before the 22nd or after the 24th.  Dr. Hazle Coca next available appt 7/24 at 8am and pt unable to make it r/t trip.  Advised pt to see an Extender this week for evaluation and pt declined.  Stated she only wants to see Dr. Allyson Sabal and scheduled appt for 7.31.14 at 8:45am w/ Dr. Allyson Sabal.  Pt advised to go to ER for any worsening symptoms or color changes in foot.  Pt verbalized understanding and agreed w/ plan.  Message forwarded to Dr. Allyson Sabal for review.

## 2013-03-10 NOTE — Telephone Encounter (Signed)
Paper chart# [id number] on cart if needed.

## 2013-03-10 NOTE — Telephone Encounter (Signed)
Patient's foot and ankle are hurting and swollen. She had an ultrasound about a year ago and Dr. Allyson Sabal mentioned that there was a little something wrong but not to be concerned and to call if it starts hurting.  She also has pins and screws in that ankle.  Not sure if she needs to see Dr. Allyson Sabal or the orthopedic doctor.

## 2013-03-13 NOTE — Telephone Encounter (Signed)
I will see at next available office appointment

## 2013-03-26 DIAGNOSIS — H6120 Impacted cerumen, unspecified ear: Secondary | ICD-10-CM | POA: Diagnosis not present

## 2013-03-27 ENCOUNTER — Ambulatory Visit (INDEPENDENT_AMBULATORY_CARE_PROVIDER_SITE_OTHER): Payer: Medicare Other | Admitting: Cardiovascular Disease

## 2013-03-27 ENCOUNTER — Encounter: Payer: Self-pay | Admitting: Cardiovascular Disease

## 2013-03-27 ENCOUNTER — Encounter (HOSPITAL_COMMUNITY): Payer: Self-pay | Admitting: Cardiovascular Disease

## 2013-03-27 VITALS — BP 156/72 | HR 80 | Ht 65.5 in | Wt 161.0 lb

## 2013-03-27 DIAGNOSIS — Z01818 Encounter for other preprocedural examination: Secondary | ICD-10-CM

## 2013-03-27 DIAGNOSIS — R5383 Other fatigue: Secondary | ICD-10-CM

## 2013-03-27 DIAGNOSIS — D689 Coagulation defect, unspecified: Secondary | ICD-10-CM | POA: Diagnosis not present

## 2013-03-27 DIAGNOSIS — R5381 Other malaise: Secondary | ICD-10-CM | POA: Diagnosis not present

## 2013-03-27 DIAGNOSIS — Z79899 Other long term (current) drug therapy: Secondary | ICD-10-CM | POA: Diagnosis not present

## 2013-03-27 DIAGNOSIS — I739 Peripheral vascular disease, unspecified: Secondary | ICD-10-CM | POA: Diagnosis not present

## 2013-03-27 NOTE — Assessment & Plan Note (Signed)
Patient underwent PVH gram 02/21/12 revealing high grade segmental mid right SFA stenosis with three-vessel runoff. I implanted a 6 mm x 100 mm long Nitinol self expanding stent with an excellent angiographic and Doppler results. Her discomfort however did not improve until she had back surgery subsequent to that. Her left Doppler study performed 12/02/12 revealed a decrease in her ABI is 0.43 from 0.87 with what appeared to be an occluded SFA stent though when I saw her back in May she was asymptomatic. She's now complains of right foot pain. Apparently she had some screws in her ankle which are problematic. I suspect that an orthopedic surgeon would not approach her surgically with her compromised circulation. In addition, it is certainly possible that her symptoms are related to her low blood flow. We discussed the options and decide to proceed with attempt at percutaneous revascularization of her occluded right SFA stent.

## 2013-03-27 NOTE — Progress Notes (Signed)
03/27/2013 Jennifer Atkinson   Aug 18, 1920  [id number]  Primary Physician Benita Stabile, MD Primary Cardiologist: Runell Gess MD Jennifer Atkinson   HPI:  She is a 79 year old mildly overweight widowed Caucasian female, mother of 4 and grandmother to 10 grandchildren, accompanied by one of her daughters today. She was originally referred to me for claudication.   I performed angiogram on her Jan 25, 2012, and revealed segmentally stenosed mid right SFA, which I stented resulting in improvement in her Dopplers; however, her symptoms did not significantly improve. She ultimately had back surgery, which resulted in marked improvement in her symptoms. Followup Dopplers performed December 02, 2012, showed that her stent was functionally occluded with a decrease in her ABIs from the 0.8 range to 0.43, though she denies claudication. In addition, she denies chest pain or shortness of breath. She did have a negative Myoview.  Since I saw her back in May she developed pain in her right foot. Some clear clear whether this is ischemic versus orthopedic. Appellate she's had some prior screws placed ankle which are starting to come out. Her ABI is 0.43 on that side making it problematic for orthopedic surgery safely because of compromise circulation. It is also possible that her symptoms are ischemic in nature. Will proceed with attempt at percutaneous transposition of her occluded right SFA stent.     Current Outpatient Prescriptions  Medication Sig Dispense Refill  . ALPRAZolam (XANAX) 0.25 MG tablet Take 0.25 mg by mouth at bedtime as needed. For sleep      . aspirin 81 MG chewable tablet Chew 81 mg by mouth daily.      . Calcium Carbonate-Vitamin D (CALCIUM-VITAMIN D) 500-200 MG-UNIT per tablet Take 1 tablet by mouth daily.       . fish oil-omega-3 fatty acids 1000 MG capsule Take 1 g by mouth daily.       . fluticasone (FLONASE) 50 MCG/ACT nasal spray Place 2 sprays into the nose daily.        . Glucosamine-Chondroitin (OSTEO BI-FLEX REGULAR STRENGTH PO) Take 1 tablet by mouth daily.       . hydrochlorothiazide 25 MG tablet Take 25 mg by mouth daily.        Marland Kitchen HYDROcodone-acetaminophen (NORCO/VICODIN) 5-325 MG per tablet Take 1-2 tablets by mouth every 4 (four) hours as needed for pain.  50 tablet  0  . HYDROcodone-acetaminophen (VICODIN) 5-500 MG per tablet Take 1 tablet by mouth every 4 (four) hours as needed. For pain      . Lactobacillus (ACIDOPHILUS) CAPS Take 1 capsule by mouth daily.      Marland Kitchen levothyroxine (SYNTHROID, LEVOTHROID) 75 MCG tablet Take 75 mcg by mouth daily before breakfast.      . omeprazole (PRILOSEC) 10 MG capsule Take 20 mg by mouth daily.        No current facility-administered medications for this visit.    No Known Allergies  History   Social History  . Marital Status: Widowed    Spouse Name: N/A    Number of Children: N/A  . Years of Education: N/A   Occupational History  . Not on file.   Social History Main Topics  . Smoking status: Never Smoker   . Smokeless tobacco: Never Used  . Alcohol Use: 2.4 oz/week    4 Glasses of wine per week     Comment: OCCASSIONAL  . Drug Use: No  . Sexually Active: No   Other Topics Concern  . Not on file  Social History Narrative  . No narrative on file     Review of Systems: General: negative for chills, fever, night sweats or weight changes.  Cardiovascular: negative for chest pain, dyspnea on exertion, edema, orthopnea, palpitations, paroxysmal nocturnal dyspnea or shortness of breath Dermatological: negative for rash Respiratory: negative for cough or wheezing Urologic: negative for hematuria Abdominal: negative for nausea, vomiting, diarrhea, bright red blood per rectum, melena, or hematemesis Neurologic: negative for visual changes, syncope, or dizziness All other systems reviewed and are otherwise negative except as noted above.    Blood pressure 156/72, pulse 80, height 5' 5.5" (1.664  m), weight 161 lb (73.029 kg).  General appearance: alert and no distress Neck: no adenopathy, no carotid bruit, no JVD, supple, symmetrical, trachea midline and thyroid not enlarged, symmetric, no tenderness/mass/nodules Lungs: clear to auscultation bilaterally Heart: regular rate and rhythm, S1, S2 normal, no murmur, click, rub or gallop Extremities: extremities normal, atraumatic, no cyanosis or edema  EKG not performed to  ASSESSMENT AND PLAN:   Claudication Patient underwent PVH gram 02/21/12 revealing high grade segmental mid right SFA stenosis with three-vessel runoff. I implanted a 6 mm x 100 mm long Nitinol self expanding stent with an excellent angiographic and Doppler results. Her discomfort however did not improve until she had back surgery subsequent to that. Her left Doppler study performed 12/02/12 revealed a decrease in her ABI is 0.43 from 0.87 with what appeared to be an occluded SFA stent though when I saw her back in May she was asymptomatic. She's now complains of right foot pain. Apparently she had some screws in her ankle which are problematic. I suspect that an orthopedic surgeon would not approach her surgically with her compromised circulation. In addition, it is certainly possible that her symptoms are related to her low blood flow. We discussed the options and decide to proceed with attempt at percutaneous revascularization of her occluded right SFA stent.      Runell Gess MD FACP,FACC,FAHA, [facility] 03/27/2013 9:43 AM

## 2013-03-27 NOTE — Patient Instructions (Addendum)
Dr. Allyson Sabal has ordered a peripheral angiogram to be done at [facility].  This procedure is going to look at the bloodflow in your lower extremities.  If Dr. Allyson Sabal is able to open up the arteries, you will have to spend one night in the hospital.  If he is not able to open the arteries, you will be able to go home that same day.    After the procedure, you will not be allowed to drive for 3 days or push, pull, or lift anything greater than 10 lbs for one week.    You will be required to have bloodwork  prior to your procedure.  Our scheduler will advise you on when these items need to be done.    Reps: Scott and John Left groin access  Lower extremity dopplers prior to the angiogram

## 2013-03-28 ENCOUNTER — Encounter (HOSPITAL_COMMUNITY): Payer: Self-pay | Admitting: Pharmacy Technician

## 2013-04-01 ENCOUNTER — Encounter (HOSPITAL_COMMUNITY): Payer: Medicare Other

## 2013-04-01 ENCOUNTER — Ambulatory Visit (HOSPITAL_COMMUNITY)
Admission: RE | Admit: 2013-04-01 | Discharge: 2013-04-01 | Disposition: A | Discharge status: Home / Self Care | Payer: Medicare Other | Source: Ambulatory Visit | Attending: Cardiovascular Disease | Admitting: Cardiovascular Disease

## 2013-04-01 DIAGNOSIS — Z79899 Other long term (current) drug therapy: Secondary | ICD-10-CM | POA: Diagnosis not present

## 2013-04-01 DIAGNOSIS — I70219 Atherosclerosis of native arteries of extremities with intermittent claudication, unspecified extremity: Secondary | ICD-10-CM

## 2013-04-01 DIAGNOSIS — D689 Coagulation defect, unspecified: Secondary | ICD-10-CM | POA: Diagnosis not present

## 2013-04-01 DIAGNOSIS — I739 Peripheral vascular disease, unspecified: Secondary | ICD-10-CM | POA: Insufficient documentation

## 2013-04-01 DIAGNOSIS — R5381 Other malaise: Secondary | ICD-10-CM | POA: Diagnosis not present

## 2013-04-01 NOTE — Progress Notes (Signed)
Right Lower Ext.Arterial Completed. Preliminary by tech - Occluded right distal SFA stent was noted. Marilynne Halsted, BS, RDMS, RVT

## 2013-04-02 LAB — TSH: TSH: 2.873 u[IU]/mL (ref 0.350–4.500)

## 2013-04-02 LAB — CBC
MCH: 28.7 pg (ref 26.0–34.0)
MCV: 85.9 fL (ref 78.0–100.0)
Platelets: 354 10*3/uL (ref 150–400)
RBC: 4.46 MIL/uL (ref 3.87–5.11)
RDW: 14.8 % (ref 11.5–15.5)

## 2013-04-02 LAB — BASIC METABOLIC PANEL
Calcium: 10 mg/dL (ref 8.4–10.5)
Creat: 1.23 mg/dL — ABNORMAL HIGH (ref 0.50–1.10)

## 2013-04-03 ENCOUNTER — Encounter: Payer: Self-pay | Admitting: *Deleted

## 2013-04-08 ENCOUNTER — Ambulatory Visit (HOSPITAL_COMMUNITY)
Admission: RE | Admit: 2013-04-08 | Discharge: 2013-04-09 | Disposition: A | Discharge status: Home / Self Care | Payer: Medicare Other | Source: Ambulatory Visit | Attending: Cardiovascular Disease | Admitting: Cardiovascular Disease

## 2013-04-08 ENCOUNTER — Encounter (HOSPITAL_COMMUNITY)
Admission: RE | Disposition: A | Discharge status: Home / Self Care | Payer: Self-pay | Source: Ambulatory Visit | Attending: Cardiovascular Disease

## 2013-04-08 ENCOUNTER — Encounter (HOSPITAL_COMMUNITY): Payer: Self-pay | Admitting: General Practice

## 2013-04-08 DIAGNOSIS — E785 Hyperlipidemia, unspecified: Secondary | ICD-10-CM | POA: Diagnosis present

## 2013-04-08 DIAGNOSIS — I70219 Atherosclerosis of native arteries of extremities with intermittent claudication, unspecified extremity: Secondary | ICD-10-CM | POA: Insufficient documentation

## 2013-04-08 DIAGNOSIS — E039 Hypothyroidism, unspecified: Secondary | ICD-10-CM | POA: Diagnosis present

## 2013-04-08 DIAGNOSIS — Z79899 Other long term (current) drug therapy: Secondary | ICD-10-CM | POA: Insufficient documentation

## 2013-04-08 DIAGNOSIS — Z7902 Long term (current) use of antithrombotics/antiplatelets: Secondary | ICD-10-CM | POA: Insufficient documentation

## 2013-04-08 DIAGNOSIS — I739 Peripheral vascular disease, unspecified: Secondary | ICD-10-CM | POA: Diagnosis present

## 2013-04-08 DIAGNOSIS — N182 Chronic kidney disease, stage 2 (mild): Secondary | ICD-10-CM | POA: Insufficient documentation

## 2013-04-08 DIAGNOSIS — I129 Hypertensive chronic kidney disease with stage 1 through stage 4 chronic kidney disease, or unspecified chronic kidney disease: Secondary | ICD-10-CM | POA: Insufficient documentation

## 2013-04-08 DIAGNOSIS — N183 Chronic kidney disease, stage 3 unspecified: Secondary | ICD-10-CM | POA: Diagnosis present

## 2013-04-08 DIAGNOSIS — I10 Essential (primary) hypertension: Secondary | ICD-10-CM | POA: Diagnosis present

## 2013-04-08 DIAGNOSIS — Z01818 Encounter for other preprocedural examination: Secondary | ICD-10-CM

## 2013-04-08 HISTORY — DX: Gastro-esophageal reflux disease without esophagitis: K21.9

## 2013-04-08 HISTORY — PX: LOWER EXTREMITY ANGIOGRAM: SHX5508

## 2013-04-08 LAB — POCT ACTIVATED CLOTTING TIME
Activated Clotting Time: 222 seconds
Activated Clotting Time: 186 seconds
Activated Clotting Time: 176 seconds

## 2013-04-08 SURGERY — ANGIOGRAM, LOWER EXTREMITY
Anesthesia: LOCAL | Laterality: Right

## 2013-04-08 MED ORDER — PANTOPRAZOLE SODIUM 40 MG PO TBEC
40.0000 mg | DELAYED_RELEASE_TABLET | Freq: Every day | ORAL | Status: DC
  Administered 2013-04-08: 40 mg via ORAL
  Filled 2013-04-08: qty 1

## 2013-04-08 MED ORDER — ALPRAZOLAM 0.25 MG PO TABS
0.2500 mg | ORAL_TABLET | Freq: Every evening | ORAL | Status: DC | PRN
  Administered 2013-04-08: 22:00:00 0.25 mg via ORAL
  Filled 2013-04-08: qty 1

## 2013-04-08 MED ORDER — LEVOTHYROXINE SODIUM 75 MCG PO TABS
75.0000 ug | ORAL_TABLET | Freq: Every day | ORAL | Status: DC
  Administered 2013-04-09: 06:00:00 75 ug via ORAL
  Filled 2013-04-08 (×2): qty 1

## 2013-04-08 MED ORDER — SODIUM CHLORIDE 0.9 % IV SOLN: INTRAVENOUS | Status: AC

## 2013-04-08 MED ORDER — ASPIRIN 81 MG PO CHEW: 324.0000 mg | CHEWABLE_TABLET | ORAL | Status: DC

## 2013-04-08 MED ORDER — SODIUM CHLORIDE 0.9 % IV SOLN
INTRAVENOUS | Status: DC
  Administered 2013-04-08: 10:00:00 via INTRAVENOUS

## 2013-04-08 MED ORDER — SODIUM CHLORIDE 0.9 % IJ SOLN: 3.0000 mL | INTRAMUSCULAR | Status: DC | PRN

## 2013-04-08 MED ORDER — LIDOCAINE HCL (PF) 1 % IJ SOLN
INTRAMUSCULAR | Status: AC
  Filled 2013-04-08: qty 30

## 2013-04-08 MED ORDER — HEPARIN (PORCINE) IN NACL 2-0.9 UNIT/ML-% IJ SOLN
INTRAMUSCULAR | Status: AC
  Filled 2013-04-08: qty 1000

## 2013-04-08 MED ORDER — ASPIRIN 81 MG PO CHEW
CHEWABLE_TABLET | ORAL | Status: AC
  Administered 2013-04-08: 324 mg
  Filled 2013-04-08: qty 4

## 2013-04-08 MED ORDER — FLUTICASONE PROPIONATE 50 MCG/ACT NA SUSP
2.0000 | Freq: Every day | NASAL | Status: DC
  Filled 2013-04-08: qty 16

## 2013-04-08 MED ORDER — HEPARIN SODIUM (PORCINE) 1000 UNIT/ML IJ SOLN
INTRAMUSCULAR | Status: AC
  Filled 2013-04-08: qty 1

## 2013-04-08 MED ORDER — HYDRALAZINE HCL 20 MG/ML IJ SOLN
10.0000 mg | INTRAMUSCULAR | Status: DC | PRN
  Filled 2013-04-08: qty 0.5

## 2013-04-08 MED ORDER — HYDROCODONE-ACETAMINOPHEN 5-325 MG PO TABS
ORAL_TABLET | ORAL | Status: AC
  Filled 2013-04-08: qty 1

## 2013-04-08 MED ORDER — CLOPIDOGREL BISULFATE 300 MG PO TABS
ORAL_TABLET | ORAL | Status: AC
  Filled 2013-04-08: qty 1

## 2013-04-08 MED ORDER — HYDROCODONE-ACETAMINOPHEN 5-325 MG PO TABS
1.0000 | ORAL_TABLET | Freq: Four times a day (QID) | ORAL | Status: DC | PRN
  Administered 2013-04-08: 1 via ORAL

## 2013-04-08 MED ORDER — ASPIRIN 81 MG PO CHEW
81.0000 mg | CHEWABLE_TABLET | Freq: Every day | ORAL | Status: DC
  Administered 2013-04-09: 81 mg via ORAL
  Filled 2013-04-08 (×2): qty 1

## 2013-04-08 MED ORDER — DIAZEPAM 5 MG PO TABS
ORAL_TABLET | ORAL | Status: AC
  Administered 2013-04-08: 5 mg
  Filled 2013-04-08: qty 1

## 2013-04-08 MED ORDER — OMEGA-3 FATTY ACIDS 1000 MG PO CAPS: 1.0000 g | ORAL_CAPSULE | Freq: Every day | ORAL | Status: DC

## 2013-04-08 MED ORDER — HYDROCODONE-ACETAMINOPHEN 5-325 MG PO TABS
1.0000 | ORAL_TABLET | ORAL | Status: DC | PRN
  Administered 2013-04-08: 22:00:00 1 via ORAL
  Filled 2013-04-08: qty 1

## 2013-04-08 MED ORDER — ONDANSETRON HCL 4 MG/2ML IJ SOLN: 4.0000 mg | Freq: Four times a day (QID) | INTRAMUSCULAR | Status: DC | PRN

## 2013-04-08 MED ORDER — HYDROCHLOROTHIAZIDE 25 MG PO TABS
25.0000 mg | ORAL_TABLET | Freq: Every day | ORAL | Status: DC
  Administered 2013-04-09: 25 mg via ORAL
  Filled 2013-04-08 (×2): qty 1

## 2013-04-08 MED ORDER — OMEGA-3-ACID ETHYL ESTERS 1 G PO CAPS
1.0000 g | ORAL_CAPSULE | Freq: Every day | ORAL | Status: DC
  Administered 2013-04-09: 1 g via ORAL
  Filled 2013-04-08 (×2): qty 1

## 2013-04-08 MED ORDER — ACETAMINOPHEN 325 MG PO TABS: 650.0000 mg | ORAL_TABLET | ORAL | Status: DC | PRN

## 2013-04-08 MED ORDER — HYDRALAZINE HCL 20 MG/ML IJ SOLN: 10.0000 mg | INTRAMUSCULAR | Status: DC | PRN

## 2013-04-08 MED ORDER — MORPHINE SULFATE 2 MG/ML IJ SOLN: 1.0000 mg | INTRAMUSCULAR | Status: DC | PRN

## 2013-04-08 MED ORDER — DIAZEPAM 5 MG PO TABS: 5.0000 mg | ORAL_TABLET | ORAL | Status: DC

## 2013-04-08 NOTE — H&P (Signed)
   Pt was reexamined and existing H & P reviewed. No changes found.  Runell Gess, MD [facility] 04/08/2013 1:41 PM

## 2013-04-08 NOTE — CV Procedure (Signed)
Jennifer Atkinson is a 79 y.o. female    [id number] LOCATION:  FACILITY: [facility]  PHYSICIAN: Nanetta Batty, M.D. [birth year]   DATE OF PROCEDURE:  04/08/2013  DATE OF DISCHARGE:   CARDIAC CATHETERIZATION     History obtained from chart review.She is a 79 year old mildly overweight widowed Caucasian female, mother of 4 and grandmother to 26 grandchildren, accompanied by one of her daughters today. She was originally referred to me for claudication.   I performed angiogram on her Jan 25, 2012, and revealed segmentally stenosed mid right SFA, which I stented resulting in improvement in her Dopplers; however, her symptoms did not significantly improve. She ultimately had back surgery, which resulted in marked improvement in her symptoms. Followup Dopplers performed December 02, 2012, showed that her stent was functionally occluded with a decrease in her ABIs from the 0.8 range to 0.43, though she denies claudication. In addition, she denies chest pain or shortness of breath. She did have a negative Myoview.  Since I saw her back in May she developed pain in her right foot. Some clear clear whether this is ischemic versus orthopedic. Appellate she's had some prior screws placed ankle which are starting to come out. Her ABI is 0.43 on that side making it problematic for orthopedic surgery safely because of compromise circulation. It is also possible that her symptoms are ischemic in nature. Will proceed with attempt at percutaneous transposition of her occluded right SFA stent.    PROCEDURE DESCRIPTION:    The patient was brought to the second floor [place] [facility]iac cath lab in the postabsorptive state. She was premedicated with Valium 5 mg by mouth, IV Versed and fentanyl. Her left groinwas prepped and shaved in usual sterile fashion. Xylocaine 1% was used for local anesthesia. A 5 French sheath was inserted into the left common femoral artery using standard Seldinger technique. A 5 French silver catheter  was used to obtain contralateral access along with a versicolor wire and an endhole catheter. Right lower extremity angiography was performed using digital subtraction bolus chase the table technique. Visipaque dye was used for the entirety of the case. Retrograde aortic pressure was monitored during the case.  HEMODYNAMICS:    AO SYSTOLIC/AO DIASTOLIC: 188/69     ANGIOGRAPHIC RESULTS:   1: Right lower extremity-the previously placed Nitinol stent in the mid right SFA was totally occluded. There is long segmental 90% proximal and distal stenosis in the SFA with 2 vessel runoff. The posterior tibial is occluded.  IMPRESSION:high-grade diffuse disease in the proximal and distal right SFA with an occluded Nitrol stent. I will proceed with PTA and restenting using Viabahn  stent graft.  Procedure description: The endhole catheter was exchanged for a 7 French/55 centimeter long Ansel Sheath. The patient received 7500 units of heparin with an ACT of 222. Total contrast administered the patient was 126 cc the occluded stent was crossed with a quick cross O18 endhole catheter along with a long O14 [place] core wire. Following this balloon angioplasty  was performed with a 5 mm x 130 mm long balloon throughout the entirety of the SFA including within the occluded stent. Following this Viabahn  stent grafting was performed using overlapping 5 x 1 50/5 bite to 50 mm stents postdilated with a 5 x 1 50 balloon resulting in reduction of the total occlusion with high-grade proximal distal disease to 0% residual with excellent flow and two-vessel runoff down to the foot. The patient tolerated the procedure well. The sheath was then withdrawn  across the bifurcation and exchanged for a short 7 [place] sheath. She received 300 mg of by mouth Plavix.  Final impression: Successful PTA and repeat stenting using Viabahn Stent grafts for high-grade diffuse SFA disease in the setting of critical limb ischemia. Patient will be  hydrated overnight and discharged home tomorrow. The sheath will be removed once the ACT falls below 170 and pressure will be held on the groin to achieve hemostasis. She'll be on aspirin Plavix. Lower extremity arterial Dopplers will be performed after which she will see me back in followup.  Runell Gess MD, [facility] 04/08/2013 3:08 PM

## 2013-04-09 ENCOUNTER — Other Ambulatory Visit: Payer: Self-pay | Admitting: Cardiology

## 2013-04-09 ENCOUNTER — Encounter (HOSPITAL_COMMUNITY): Payer: Self-pay | Admitting: Cardiology

## 2013-04-09 DIAGNOSIS — I739 Peripheral vascular disease, unspecified: Secondary | ICD-10-CM | POA: Diagnosis not present

## 2013-04-09 DIAGNOSIS — E785 Hyperlipidemia, unspecified: Secondary | ICD-10-CM | POA: Diagnosis not present

## 2013-04-09 DIAGNOSIS — Z01818 Encounter for other preprocedural examination: Secondary | ICD-10-CM

## 2013-04-09 DIAGNOSIS — N183 Chronic kidney disease, stage 3 unspecified: Secondary | ICD-10-CM

## 2013-04-09 DIAGNOSIS — E039 Hypothyroidism, unspecified: Secondary | ICD-10-CM | POA: Diagnosis present

## 2013-04-09 DIAGNOSIS — I10 Essential (primary) hypertension: Secondary | ICD-10-CM

## 2013-04-09 LAB — BASIC METABOLIC PANEL
Calcium: 8.8 mg/dL (ref 8.4–10.5)
GFR calc Af Amer: 48 mL/min — ABNORMAL LOW (ref >90)
GFR calc non Af Amer: 41 mL/min — ABNORMAL LOW (ref >90)
Glucose, Bld: 110 mg/dL — ABNORMAL HIGH (ref 70–99)
Potassium: 4 mEq/L (ref 3.5–5.1)
Sodium: 135 mEq/L (ref 135–145)

## 2013-04-09 LAB — CBC
Hemoglobin: 12 g/dL (ref 12.0–15.0)
Platelets: 261 10*3/uL (ref 150–400)
RBC: 3.98 MIL/uL (ref 3.87–5.11)
WBC: 7.9 10*3/uL (ref 4.0–10.5)

## 2013-04-09 MED ORDER — ACETAMINOPHEN 325 MG PO TABS: 650.0000 mg | ORAL_TABLET | ORAL | Status: DC | PRN

## 2013-04-09 MED ORDER — CLOPIDOGREL BISULFATE 75 MG PO TABS: 75.0000 mg | ORAL_TABLET | Freq: Every day | ORAL | Status: DC

## 2013-04-09 NOTE — Progress Notes (Signed)
   Subjective:  No CP/SOB or leg pain   Objective:  Temp:  [97.4 F (36.3 C)-97.9 F (36.6 C)] 97.7 F (36.5 C) (08/13 0826) Pulse Rate:  [27-78] 78 (08/13 0826) Resp:  [17-20] 18 (08/13 0826) BP: (106-170)/(34-80) 125/44 mmHg (08/13 0826) SpO2:  [80 %-100 %] 97 % (08/13 0826) Weight:  [158 lb (71.668 kg)-158 lb 11.7 oz (72 kg)] 158 lb 11.7 oz (72 kg) (08/13 0037) Weight change:   Intake/Output from previous day: 08/12 0701 - 08/13 0700 In: 635 [P.O.:240; I.V.:395] Out: 950 [Urine:950]  Intake/Output from this shift:    Physical Exam: General appearance: alert and no distress Neck: no adenopathy, no carotid bruit, no JVD, supple, symmetrical, trachea midline and thyroid not enlarged, symmetric, no tenderness/mass/nodules Lungs: clear to auscultation bilaterally Heart: regular rate and rhythm, S1, S2 normal, no murmur, click, rub or gallop Extremities: extremities normal, atraumatic, no cyanosis or edema and Left groin OK. Right foot warm and palpable pulse  Lab Results: Results for orders placed during the hospital encounter of 04/08/13 (from the past 48 hour(s))  POCT ACTIVATED CLOTTING TIME     Status: None   Collection Time    04/08/13  2:07 PM      Result Value Range   Activated Clotting Time 186    POCT ACTIVATED CLOTTING TIME     Status: None   Collection Time    04/08/13  2:28 PM      Result Value Range   Activated Clotting Time 222    POCT ACTIVATED CLOTTING TIME     Status: None   Collection Time    04/08/13  3:22 PM      Result Value Range   Activated Clotting Time 201    POCT ACTIVATED CLOTTING TIME     Status: None   Collection Time    04/08/13  4:03 PM      Result Value Range   Activated Clotting Time 186    POCT ACTIVATED CLOTTING TIME     Status: None   Collection Time    04/08/13  4:34 PM      Result Value Range   Activated Clotting Time 176    CBC     Status: Abnormal   Collection Time    04/09/13  7:40 AM      Result Value Range   WBC  7.9  4.0 - 10.5 K/uL   RBC 3.98  3.87 - 5.11 MIL/uL   Hemoglobin 12.0  12.0 - 15.0 g/dL   HCT 16.1 (*) 09.6 - 04.5 %   MCV 86.4  78.0 - 100.0 fL   MCH 30.2  26.0 - 34.0 pg   MCHC 34.9  30.0 - 36.0 g/dL   RDW 40.9  81.1 - 91.4 %   Platelets 261  150 - 400 K/uL    Imaging: Imaging results have been reviewed  Assessment/Plan:   1. Active Problems: 2.   * No active hospital problems. * 3.   Time Spent Directly with Patient:  20 minutes  Length of Stay:  LOS: 1 day   S/P PTA and Stenting RSFO with Viabahn covered stents for CLI. Looks great!! No foot pain this AM. Waiting for BMET. OK for D/C home on ASA and Plavix. LEA the ROV.   Runell Gess 04/09/2013, 8:37 AM

## 2013-04-09 NOTE — Discharge Summary (Signed)
Patient ID: Jennifer Atkinson,  MRN: [id number], DOB/AGE: [id number] 77 y.o.  Admit date: 04/08/2013 Discharge date: 04/09/2013  Primary Care Provider: Dr Lupe Carney Primary Cardiologist: Dr Allyson Sabal  Discharge Diagnoses Principal Problem:   Critical lower limb ischemia- ISR Rt SFA Rx'd with PTA 04/08/13 Active Problems:   PVD, Rt SFA May 2013   Chronic renal disease, stage II   HTN (hypertension)   Dyslipidemia   Hypothyroid    Procedures: Lower extremity angiogram with successful PTA and stenting of RSFA for ISR.   Hospital Course: 79 y/o female referred to Dr Allyson Sabal in 2013 for leg pain suspected to be claudication. She did have abnormal ABIs then. She had an angiogram and subsequent Rt SFA stenting in May 2013. She continued to have pain and ultimately had back surgery in Feb 2014 which relieved her symptoms. She has since developed pain in her Rt foot. She has had several prior ankle surgeries and has screws in place that now need to be removed. Pre op ABIs suggested ISR of the Rt SFA stent placed in May 2013 and she was admitted 8/12 14 for PVA. See Dr Hazle Coca note for complete details. She did have ISR and underwent successful repeat stenting.  She will need OP follow up dopplers and an OV with Dr Allyson Sabal.  Discharge Vitals:  Blood pressure 125/44, pulse 78, temperature 97.7 F (36.5 C), temperature source Oral, resp. rate 18, height 5\' 6"  (1.676 m), weight 158 lb 11.7 oz (72 kg), SpO2 97.00%.    Labs: Results for orders placed during the hospital encounter of 04/08/13 (from the past 48 hour(s))  POCT ACTIVATED CLOTTING TIME     Status: None   Collection Time    04/08/13  2:07 PM      Result Value Range   Activated Clotting Time 186    POCT ACTIVATED CLOTTING TIME     Status: None   Collection Time    04/08/13  2:28 PM      Result Value Range   Activated Clotting Time 222    POCT ACTIVATED CLOTTING TIME     Status: None   Collection Time    04/08/13  3:22 PM      Result Value  Range   Activated Clotting Time 201    POCT ACTIVATED CLOTTING TIME     Status: None   Collection Time    04/08/13  4:03 PM      Result Value Range   Activated Clotting Time 186    POCT ACTIVATED CLOTTING TIME     Status: None   Collection Time    04/08/13  4:34 PM      Result Value Range   Activated Clotting Time 176    BASIC METABOLIC PANEL     Status: Abnormal   Collection Time    04/09/13  7:40 AM      Result Value Range   Sodium 135  135 - 145 mEq/L   Potassium 4.0  3.5 - 5.1 mEq/L   Chloride 102  96 - 112 mEq/L   CO2 22  19 - 32 mEq/L   Glucose, Bld 110 (*) 70 - 99 mg/dL   BUN 23  6 - 23 mg/dL   Creatinine, Ser 7.82 (*) 0.50 - 1.10 mg/dL   Calcium 8.8  8.4 - 95.6 mg/dL   GFR calc non Af Amer 41 (*) >90 mL/min   GFR calc Af Amer 48 (*) >90 mL/min   Comment:  The eGFR has been calculated     using the CKD EPI equation.     This calculation has not been     validated in all clinical     situations.     eGFR's persistently     <90 mL/min signify     possible Chronic Kidney Disease.  CBC     Status: Abnormal   Collection Time    04/09/13  7:40 AM      Result Value Range   WBC 7.9  4.0 - 10.5 K/uL   RBC 3.98  3.87 - 5.11 MIL/uL   Hemoglobin 12.0  12.0 - 15.0 g/dL   HCT 40.9 (*) 81.1 - 91.4 %   MCV 86.4  78.0 - 100.0 fL   MCH 30.2  26.0 - 34.0 pg   MCHC 34.9  30.0 - 36.0 g/dL   RDW 78.2  95.6 - 21.3 %   Platelets 261  150 - 400 K/uL    Disposition: Discharged in stable condition     Follow-up Information   Follow up with Runell Gess, MD. (office will call)    Specialty:  Cardiology   Contact information:   [address] [place] [address] [phone]       Discharge Medications:    Medication List         acetaminophen 325 MG tablet  Commonly known as:  TYLENOL  Take 2 tablets (650 mg total) by mouth every 4 (four) hours as needed for pain.     Acidophilus Caps capsule  Take 1 capsule by mouth daily.      ALPRAZolam 0.25 MG tablet  Commonly known as:  XANAX  Take 0.25 mg by mouth at bedtime as needed. For sleep     aspirin 81 MG chewable tablet  Chew 81 mg by mouth daily.     calcium-vitamin D 500-200 MG-UNIT per tablet  Take 1 tablet by mouth daily.     clopidogrel 75 MG tablet  Commonly known as:  PLAVIX  Take 1 tablet (75 mg total) by mouth daily.     fish oil-omega-3 fatty acids 1000 MG capsule  Take 1 g by mouth daily.     fluticasone 50 MCG/ACT nasal spray  Commonly known as:  FLONASE  Place 2 sprays into the nose daily.     hydrochlorothiazide 25 MG tablet  Commonly known as:  HYDRODIURIL  Take 25 mg by mouth daily.     HYDROcodone-acetaminophen 5-325 MG per tablet  Commonly known as:  NORCO/VICODIN  Take 1-2 tablets by mouth every 4 (four) hours as needed for pain.     levothyroxine 75 MCG tablet  Commonly known as:  SYNTHROID, LEVOTHROID  Take 75 mcg by mouth daily before breakfast.     omeprazole 10 MG capsule  Commonly known as:  PRILOSEC  Take 20 mg by mouth daily.     OSTEO BI-FLEX REGULAR STRENGTH PO  Take 1 tablet by mouth daily.         Duration of Discharge Encounter: Greater than 30 minutes including physician time.  Jolene Provost PA-C 04/09/2013 9:33 AM

## 2013-05-01 ENCOUNTER — Ambulatory Visit (HOSPITAL_COMMUNITY)
Admission: RE | Admit: 2013-05-01 | Discharge: 2013-05-01 | Disposition: A | Discharge status: Home / Self Care | Payer: Medicare Other | Source: Ambulatory Visit | Attending: Cardiology | Admitting: Cardiology

## 2013-05-01 DIAGNOSIS — Z09 Encounter for follow-up examination after completed treatment for conditions other than malignant neoplasm: Secondary | ICD-10-CM | POA: Diagnosis not present

## 2013-05-01 DIAGNOSIS — I739 Peripheral vascular disease, unspecified: Secondary | ICD-10-CM | POA: Diagnosis not present

## 2013-05-01 NOTE — Progress Notes (Signed)
Right Lower Extremity Arterial Duplex Completed. °Brianna L Mazza,RVT °

## 2013-05-06 ENCOUNTER — Encounter: Payer: Self-pay | Admitting: *Deleted

## 2013-05-06 ENCOUNTER — Telehealth: Payer: Self-pay | Admitting: *Deleted

## 2013-05-06 DIAGNOSIS — I739 Peripheral vascular disease, unspecified: Secondary | ICD-10-CM

## 2013-05-06 NOTE — Telephone Encounter (Signed)
Message copied by Marella Bile on Tue May 06, 2013 11:11 PM ------      Message from: Runell Gess      Created: Tue May 06, 2013  4:54 PM       Improved RABI s/p stenting. Repeat study in 6 months ------

## 2013-05-06 NOTE — Telephone Encounter (Signed)
Order placed for repeat lower extremity doppler in 6 months

## 2013-05-16 DIAGNOSIS — M4310 Spondylolisthesis, site unspecified: Secondary | ICD-10-CM | POA: Diagnosis not present

## 2013-05-16 DIAGNOSIS — M5137 Other intervertebral disc degeneration, lumbosacral region: Secondary | ICD-10-CM | POA: Diagnosis not present

## 2013-05-16 DIAGNOSIS — M545 Low back pain, unspecified: Secondary | ICD-10-CM | POA: Diagnosis not present

## 2013-05-16 DIAGNOSIS — M48062 Spinal stenosis, lumbar region with neurogenic claudication: Secondary | ICD-10-CM | POA: Diagnosis not present

## 2013-05-24 DIAGNOSIS — S6390XA Sprain of unspecified part of unspecified wrist and hand, initial encounter: Secondary | ICD-10-CM | POA: Diagnosis not present

## 2013-05-24 DIAGNOSIS — M1990 Unspecified osteoarthritis, unspecified site: Secondary | ICD-10-CM | POA: Diagnosis not present

## 2013-05-26 DIAGNOSIS — M79609 Pain in unspecified limb: Secondary | ICD-10-CM | POA: Diagnosis not present

## 2013-05-26 DIAGNOSIS — M19039 Primary osteoarthritis, unspecified wrist: Secondary | ICD-10-CM | POA: Diagnosis not present

## 2013-06-02 DIAGNOSIS — K219 Gastro-esophageal reflux disease without esophagitis: Secondary | ICD-10-CM | POA: Diagnosis not present

## 2013-06-02 DIAGNOSIS — E039 Hypothyroidism, unspecified: Secondary | ICD-10-CM | POA: Diagnosis not present

## 2013-06-02 DIAGNOSIS — N183 Chronic kidney disease, stage 3 unspecified: Secondary | ICD-10-CM | POA: Diagnosis not present

## 2013-06-02 DIAGNOSIS — F411 Generalized anxiety disorder: Secondary | ICD-10-CM | POA: Diagnosis not present

## 2013-06-02 DIAGNOSIS — I10 Essential (primary) hypertension: Secondary | ICD-10-CM | POA: Diagnosis not present

## 2013-06-02 DIAGNOSIS — M159 Polyosteoarthritis, unspecified: Secondary | ICD-10-CM | POA: Diagnosis not present

## 2013-06-09 DIAGNOSIS — H2510 Age-related nuclear cataract, unspecified eye: Secondary | ICD-10-CM | POA: Diagnosis not present

## 2013-07-02 DIAGNOSIS — H2510 Age-related nuclear cataract, unspecified eye: Secondary | ICD-10-CM | POA: Diagnosis not present

## 2013-07-09 DIAGNOSIS — H2589 Other age-related cataract: Secondary | ICD-10-CM | POA: Diagnosis not present

## 2013-07-09 DIAGNOSIS — H269 Unspecified cataract: Secondary | ICD-10-CM | POA: Diagnosis not present

## 2013-07-09 DIAGNOSIS — H2510 Age-related nuclear cataract, unspecified eye: Secondary | ICD-10-CM | POA: Diagnosis not present

## 2013-07-16 DIAGNOSIS — Z23 Encounter for immunization: Secondary | ICD-10-CM | POA: Diagnosis not present

## 2013-07-17 DIAGNOSIS — H2510 Age-related nuclear cataract, unspecified eye: Secondary | ICD-10-CM | POA: Diagnosis not present

## 2013-07-21 DIAGNOSIS — H269 Unspecified cataract: Secondary | ICD-10-CM | POA: Diagnosis not present

## 2013-07-21 DIAGNOSIS — H2589 Other age-related cataract: Secondary | ICD-10-CM | POA: Diagnosis not present

## 2013-07-21 DIAGNOSIS — H2510 Age-related nuclear cataract, unspecified eye: Secondary | ICD-10-CM | POA: Diagnosis not present

## 2013-10-20 ENCOUNTER — Other Ambulatory Visit: Payer: Self-pay | Admitting: *Deleted

## 2013-10-20 DIAGNOSIS — M7120 Synovial cyst of popliteal space [Baker], unspecified knee: Secondary | ICD-10-CM | POA: Diagnosis not present

## 2013-10-20 DIAGNOSIS — M25569 Pain in unspecified knee: Secondary | ICD-10-CM | POA: Diagnosis not present

## 2013-10-20 DIAGNOSIS — M2390 Unspecified internal derangement of unspecified knee: Secondary | ICD-10-CM | POA: Diagnosis not present

## 2013-10-20 DIAGNOSIS — M25469 Effusion, unspecified knee: Secondary | ICD-10-CM | POA: Diagnosis not present

## 2013-10-20 MED ORDER — CLOPIDOGREL BISULFATE 75 MG PO TABS: 75.0000 mg | ORAL_TABLET | Freq: Every day | ORAL | Status: DC

## 2013-10-20 NOTE — Telephone Encounter (Signed)
Walk-In Message to change clopidogrel from 30-day to 90-day supply.  Refill(s) sent to pharmacy.

## 2013-11-14 ENCOUNTER — Telehealth: Payer: Self-pay | Admitting: Cardiovascular Disease

## 2013-11-14 NOTE — Telephone Encounter (Signed)
Per voice message-Please call-having problem with her upper leg. She thinks she might need a doppler.

## 2013-11-14 NOTE — Telephone Encounter (Signed)
Returned call and pt verified x 2.  Pt stated [name] said he wanted to see [name] in 6 mos.  Stated he called and was given an appt in May.  Pt stated the couple of days she started getting a sharp pain in her leg and wanted to get a doppler.  Stated when she pressed her foot down and on her leg it went away after a few minutes.  Pt concerned.  Pt informed she was supposed to have a doppler this month and order still active.  Ebony in scheduling notified and appt scheduled for Tuesday at [place].  Pt declined offer to see an Extender.  Stated she prefers to see [name].  Verbalized understanding that if needed, she can call back for a sooner appt.  Pt also requested to schedule a sooner appt w/ [name] than June and appt rescheduled to 4.15.15 at 3pm.  Pt agreed w/ plan.  FYI sent Curt Bears, RN ([name]).

## 2013-11-18 ENCOUNTER — Ambulatory Visit (HOSPITAL_COMMUNITY)
Admission: RE | Admit: 2013-11-18 | Discharge: 2013-11-18 | Disposition: A | Discharge status: Home / Self Care | Payer: Medicare Other | Source: Ambulatory Visit | Attending: Cardiovascular Disease | Admitting: Cardiovascular Disease

## 2013-11-18 DIAGNOSIS — I739 Peripheral vascular disease, unspecified: Secondary | ICD-10-CM

## 2013-11-18 NOTE — Progress Notes (Signed)
Lower Extremity Arterial Duplex Completed. °Brianna L Mazza,RVT °

## 2013-12-01 DIAGNOSIS — F411 Generalized anxiety disorder: Secondary | ICD-10-CM | POA: Diagnosis not present

## 2013-12-01 DIAGNOSIS — M159 Polyosteoarthritis, unspecified: Secondary | ICD-10-CM | POA: Diagnosis not present

## 2013-12-01 DIAGNOSIS — E039 Hypothyroidism, unspecified: Secondary | ICD-10-CM | POA: Diagnosis not present

## 2013-12-01 DIAGNOSIS — K219 Gastro-esophageal reflux disease without esophagitis: Secondary | ICD-10-CM | POA: Diagnosis not present

## 2013-12-01 DIAGNOSIS — I10 Essential (primary) hypertension: Secondary | ICD-10-CM | POA: Diagnosis not present

## 2013-12-01 DIAGNOSIS — I739 Peripheral vascular disease, unspecified: Secondary | ICD-10-CM | POA: Diagnosis not present

## 2013-12-01 DIAGNOSIS — Z23 Encounter for immunization: Secondary | ICD-10-CM | POA: Diagnosis not present

## 2013-12-01 DIAGNOSIS — N183 Chronic kidney disease, stage 3 unspecified: Secondary | ICD-10-CM | POA: Diagnosis not present

## 2013-12-10 ENCOUNTER — Ambulatory Visit (INDEPENDENT_AMBULATORY_CARE_PROVIDER_SITE_OTHER): Payer: Medicare Other | Admitting: Cardiovascular Disease

## 2013-12-10 ENCOUNTER — Encounter: Payer: Self-pay | Admitting: Cardiovascular Disease

## 2013-12-10 VITALS — BP 142/82 | HR 73 | Ht 66.0 in | Wt 165.0 lb

## 2013-12-10 DIAGNOSIS — I739 Peripheral vascular disease, unspecified: Secondary | ICD-10-CM

## 2013-12-10 DIAGNOSIS — I10 Essential (primary) hypertension: Secondary | ICD-10-CM | POA: Diagnosis not present

## 2013-12-10 DIAGNOSIS — Z79899 Other long term (current) drug therapy: Secondary | ICD-10-CM

## 2013-12-10 DIAGNOSIS — R5381 Other malaise: Secondary | ICD-10-CM

## 2013-12-10 DIAGNOSIS — E785 Hyperlipidemia, unspecified: Secondary | ICD-10-CM

## 2013-12-10 DIAGNOSIS — D689 Coagulation defect, unspecified: Secondary | ICD-10-CM | POA: Diagnosis not present

## 2013-12-10 DIAGNOSIS — R5383 Other fatigue: Secondary | ICD-10-CM

## 2013-12-10 NOTE — Patient Instructions (Signed)
[name] has ordered a peripheral angiogram to be done at [facility]  This procedure is going to look at the bloodflow in your lower extremities.  If [name] is able to open up the arteries, you will have to spend one night in the hospital.  If he is not able to open the arteries, you will be able to go home that same day.    After the procedure, you will not be allowed to drive for 3 days or push, pull, or lift anything greater than 10 lbs for one week.    You will be required to have the following tests prior to the procedure:  1. Blood work-the blood work can be done no more than 7 days prior to the procedure.  It can be done at any [facility] lab.  There is one downstairs on the first floor of this building and one in the [place] ([address])  2. Chest Xray-the chest xray order has already been placed at the [place].     *REPS Winston Left groin

## 2013-12-10 NOTE — Progress Notes (Signed)
12/10/2013 [name]   06/13/1920  [id number]  Primary Physician Donnie Coffin, MD Primary Cardiologist: [name] MD Renae Gloss   HPI:  She is a 80 year old mildly overweight widowed Caucasian female, mother of 37 and grandmother to 33 grandchildren, accompanied by one of her daughters today. She was originally referred to me for claudication.   I performed angiogram on her Jan 25, 2012, and revealed segmentally stenosed mid right SFA, which I stented resulting in improvement in her Dopplers; however, her symptoms did not significantly improve. She ultimately had back surgery, which resulted in marked improvement in her symptoms. Followup Dopplers performed December 02, 2012, showed that her stent was functionally occluded with a decrease in her ABIs from the 0.8 range to 0.43, though she denies claudication. In addition, she denies chest pain or shortness of breath. She did have a negative Myoview.  Since I saw her back in May she developed pain in her right foot. Some wan not clear whether this was ischemic versus orthopedic.I examined her on 04/08/13 revealing an occluded right SFA. I ultimately percutaneously revascularized her with overlapping bye-bye and covered stents. Preprocedure ABI was 0.95 and she had marked improvement in her claudication symptoms. Over the last several months she's had recurrent right calf claudication with decline in her ABI to .81. She's developed a new high-frequency signal at the origin of her right SFA.  Current Outpatient Prescriptions  Medication Sig Dispense Refill  . acetaminophen (TYLENOL) 325 MG tablet Take 2 tablets (650 mg total) by mouth every 4 (four) hours as needed for pain.      Marland Kitchen ALPRAZolam (XANAX) 0.25 MG tablet Take 0.25 mg by mouth at bedtime as needed. For sleep      . aspirin 81 MG chewable tablet Chew 81 mg by mouth daily.      . Calcium Carbonate-Vitamin D (CALCIUM-VITAMIN D) 500-200 MG-UNIT per tablet Take 1 tablet by  mouth daily.       . clopidogrel (PLAVIX) 75 MG tablet Take 1 tablet (75 mg total) by mouth daily. Please call for an appointment.  90 tablet  0  . fish oil-omega-3 fatty acids 1000 MG capsule Take 1 g by mouth daily.       . Glucosamine-Chondroitin (OSTEO BI-FLEX REGULAR STRENGTH PO) Take 1 tablet by mouth daily.       . hydrochlorothiazide 25 MG tablet Take 25 mg by mouth daily.        Marland Kitchen HYDROcodone-acetaminophen (NORCO/VICODIN) 5-325 MG per tablet Take 1-2 tablets by mouth every 4 (four) hours as needed for pain.  50 tablet  0  . Lactobacillus (ACIDOPHILUS) CAPS Take 1 capsule by mouth daily.      Marland Kitchen levothyroxine (SYNTHROID, LEVOTHROID) 75 MCG tablet Take 75 mcg by mouth daily before breakfast.      . omeprazole (PRILOSEC) 10 MG capsule Take 20 mg by mouth daily.        No current facility-administered medications for this visit.    Allergies  Allergen Reactions  . Nsaids     Increased Creatinine    History   Social History  . Marital Status: Widowed    Spouse Name: N/A    Number of Children: N/A  . Years of Education: N/A   Occupational History  . Not on file.   Social History Main Topics  . Smoking status: Never Smoker   . Smokeless tobacco: Never Used  . Alcohol Use: 2.4 oz/week    4 Glasses of wine per  week  . Drug Use: No  . Sexual Activity: No   Other Topics Concern  . Not on file   Social History Narrative  . No narrative on file     Review of Systems: General: negative for chills, fever, night sweats or weight changes.  Cardiovascular: negative for chest pain, dyspnea on exertion, edema, orthopnea, palpitations, paroxysmal nocturnal dyspnea or shortness of breath Dermatological: negative for rash Respiratory: negative for cough or wheezing Urologic: negative for hematuria Abdominal: negative for nausea, vomiting, diarrhea, bright red blood per rectum, melena, or hematemesis Neurologic: negative for visual changes, syncope, or dizziness All other systems  reviewed and are otherwise negative except as noted above.    Blood pressure 142/82, pulse 73, height 5\' 6"  (1.676 m), weight 165 lb (74.844 kg).  General appearance: alert and no distress Neck: no adenopathy, no carotid bruit, no JVD, supple, symmetrical, trachea midline and thyroid not enlarged, symmetric, no tenderness/mass/nodules Lungs: clear to auscultation bilaterally Heart: regular rate and rhythm, S1, S2 normal, no murmur, click, rub or gallop Extremities: extremities normal, atraumatic, no cyanosis or edema  EKG normal sinus rhythm at 73 without ST or T wave changes  ASSESSMENT AND PLAN:   Critical lower limb ischemia- ISR Rt SFA Rx'd with PTA 04/08/13 Status post stenting of her right SFA 01/24/17. She had subsequent occlusion of the stent with worsening symptoms and ABIs down from 22.43. I re\re angiogram for a cholesterol/14 and percutaneously revascularize her entire right SFA with overlapping Viabahn  covered stents up to the near origin of the SFA.her followup lower extremity arterial Doppler studies performed 05/01/13 revealed an increase of her right ABI 0.95 with acceptable velocities. She developed progressive claudication of her right calf a recent job was performed 11/18/13 revealed a high-frequency signal at the origin of her right SFA with a decline in her right ABI from 0.9 5.81.  Based on this I suspect that she developed progression of disease at the origin of her right SFA. I'm going to proceed with angiography and re-intervention.      [name] MD FACP,FACC,FAHA, [facility] 12/10/2013 3:43 PM

## 2013-12-10 NOTE — Assessment & Plan Note (Signed)
Status post stenting of her right SFA 01/24/17. She had subsequent occlusion of the stent with worsening symptoms and ABIs down from 22.43. I re\re angiogram for a cholesterol/14 and percutaneously revascularize her entire right SFA with overlapping Viabahn  covered stents up to the near origin of the SFA.her followup lower extremity arterial Doppler studies performed 05/01/13 revealed an increase of her right ABI 0.95 with acceptable velocities. She developed progressive claudication of her right calf a recent job was performed 11/18/13 revealed a high-frequency signal at the origin of her right SFA with a decline in her right ABI from 0.9 5.81.  Based on this I suspect that she developed progression of disease at the origin of her right SFA. I'm going to proceed with angiography and re-intervention.

## 2013-12-11 ENCOUNTER — Encounter: Payer: Self-pay | Admitting: Cardiovascular Disease

## 2013-12-23 ENCOUNTER — Encounter (HOSPITAL_COMMUNITY): Payer: Self-pay | Admitting: Pharmacy Technician

## 2013-12-30 ENCOUNTER — Ambulatory Visit
Admission: RE | Admit: 2013-12-30 | Discharge: 2013-12-30 | Disposition: A | Discharge status: Home / Self Care | Payer: Medicare Other | Source: Ambulatory Visit | Attending: Cardiovascular Disease | Admitting: Cardiovascular Disease

## 2013-12-30 DIAGNOSIS — D689 Coagulation defect, unspecified: Secondary | ICD-10-CM | POA: Diagnosis not present

## 2013-12-30 DIAGNOSIS — Z01818 Encounter for other preprocedural examination: Secondary | ICD-10-CM | POA: Diagnosis not present

## 2013-12-30 DIAGNOSIS — Z79899 Other long term (current) drug therapy: Secondary | ICD-10-CM | POA: Diagnosis not present

## 2013-12-30 DIAGNOSIS — I739 Peripheral vascular disease, unspecified: Secondary | ICD-10-CM

## 2013-12-30 DIAGNOSIS — R5383 Other fatigue: Secondary | ICD-10-CM | POA: Diagnosis not present

## 2013-12-30 DIAGNOSIS — R5381 Other malaise: Secondary | ICD-10-CM | POA: Diagnosis not present

## 2013-12-30 LAB — CBC
HEMATOCRIT: 37.1 % (ref 36.0–46.0)
HEMOGLOBIN: 12.6 g/dL (ref 12.0–15.0)
MCH: 29.6 pg (ref 26.0–34.0)
MCHC: 34 g/dL (ref 30.0–36.0)
MCV: 87.3 fL (ref 78.0–100.0)
Platelets: 327 10*3/uL (ref 150–400)
RBC: 4.25 MIL/uL (ref 3.87–5.11)
RDW: 15.1 % (ref 11.5–15.5)
WBC: 9.7 10*3/uL (ref 4.0–10.5)

## 2013-12-30 LAB — BASIC METABOLIC PANEL
BUN: 25 mg/dL — ABNORMAL HIGH (ref 6–23)
CALCIUM: 9.4 mg/dL (ref 8.4–10.5)
CHLORIDE: 99 meq/L (ref 96–112)
CO2: 28 meq/L (ref 19–32)
Creat: 1.25 mg/dL — ABNORMAL HIGH (ref 0.50–1.10)
GLUCOSE: 84 mg/dL (ref 70–99)
Potassium: 4.4 mEq/L (ref 3.5–5.3)
SODIUM: 135 meq/L (ref 135–145)

## 2013-12-30 LAB — TSH: TSH: 2.251 u[IU]/mL (ref 0.350–4.500)

## 2013-12-31 LAB — APTT: APTT: 28 s (ref 24–37)

## 2013-12-31 LAB — PROTIME-INR
INR: 0.94 (ref <1.50)
Prothrombin Time: 12.5 seconds (ref 11.6–15.2)

## 2014-01-05 ENCOUNTER — Encounter (HOSPITAL_COMMUNITY)
Admission: RE | Disposition: A | Discharge status: Home / Self Care | Payer: Self-pay | Source: Ambulatory Visit | Attending: Cardiovascular Disease

## 2014-01-05 ENCOUNTER — Encounter: Payer: Self-pay | Admitting: *Deleted

## 2014-01-05 ENCOUNTER — Ambulatory Visit (HOSPITAL_COMMUNITY)
Admission: RE | Admit: 2014-01-05 | Discharge: 2014-01-06 | Disposition: A | Discharge status: Home / Self Care | Payer: Medicare Other | Source: Ambulatory Visit | Attending: Cardiovascular Disease | Admitting: Cardiovascular Disease

## 2014-01-05 DIAGNOSIS — I10 Essential (primary) hypertension: Secondary | ICD-10-CM | POA: Diagnosis present

## 2014-01-05 DIAGNOSIS — E663 Overweight: Secondary | ICD-10-CM | POA: Insufficient documentation

## 2014-01-05 DIAGNOSIS — N182 Chronic kidney disease, stage 2 (mild): Secondary | ICD-10-CM | POA: Insufficient documentation

## 2014-01-05 DIAGNOSIS — N183 Chronic kidney disease, stage 3 unspecified: Secondary | ICD-10-CM | POA: Diagnosis present

## 2014-01-05 DIAGNOSIS — I70219 Atherosclerosis of native arteries of extremities with intermittent claudication, unspecified extremity: Secondary | ICD-10-CM | POA: Insufficient documentation

## 2014-01-05 DIAGNOSIS — I739 Peripheral vascular disease, unspecified: Secondary | ICD-10-CM | POA: Diagnosis present

## 2014-01-05 DIAGNOSIS — I129 Hypertensive chronic kidney disease with stage 1 through stage 4 chronic kidney disease, or unspecified chronic kidney disease: Secondary | ICD-10-CM | POA: Diagnosis not present

## 2014-01-05 DIAGNOSIS — E785 Hyperlipidemia, unspecified: Secondary | ICD-10-CM | POA: Diagnosis present

## 2014-01-05 DIAGNOSIS — Y849 Medical procedure, unspecified as the cause of abnormal reaction of the patient, or of later complication, without mention of misadventure at the time of the procedure: Secondary | ICD-10-CM | POA: Insufficient documentation

## 2014-01-05 HISTORY — PX: LOWER EXTREMITY ANGIOGRAM: SHX5508

## 2014-01-05 LAB — POCT ACTIVATED CLOTTING TIME
ACTIVATED CLOTTING TIME: 193 s
Activated Clotting Time: 215 seconds
Activated Clotting Time: 160 seconds

## 2014-01-05 SURGERY — ANGIOGRAM, LOWER EXTREMITY
Anesthesia: LOCAL | Laterality: Right

## 2014-01-05 MED ORDER — MORPHINE SULFATE 2 MG/ML IJ SOLN: 2.0000 mg | INTRAMUSCULAR | Status: DC | PRN

## 2014-01-05 MED ORDER — PANTOPRAZOLE SODIUM 40 MG PO TBEC
40.0000 mg | DELAYED_RELEASE_TABLET | Freq: Every day | ORAL | Status: DC
  Administered 2014-01-05 – 2014-01-06 (×2): 40 mg via ORAL
  Filled 2014-01-05 (×2): qty 1

## 2014-01-05 MED ORDER — LIDOCAINE HCL (PF) 1 % IJ SOLN
INTRAMUSCULAR | Status: AC
Start: 2014-01-05 — End: 2014-01-05
  Filled 2014-01-05: qty 30

## 2014-01-05 MED ORDER — SODIUM CHLORIDE 0.9 % IJ SOLN: 3.0000 mL | INTRAMUSCULAR | Status: DC | PRN

## 2014-01-05 MED ORDER — ASPIRIN 81 MG PO CHEW
81.0000 mg | CHEWABLE_TABLET | Freq: Every day | ORAL | Status: DC
  Administered 2014-01-06: 09:00:00 81 mg via ORAL
  Filled 2014-01-05: qty 1

## 2014-01-05 MED ORDER — CLOPIDOGREL BISULFATE 75 MG PO TABS
75.0000 mg | ORAL_TABLET | Freq: Every day | ORAL | Status: DC
  Administered 2014-01-06: 09:00:00 75 mg via ORAL
  Filled 2014-01-05: qty 1

## 2014-01-05 MED ORDER — ASPIRIN 81 MG PO CHEW
81.0000 mg | CHEWABLE_TABLET | ORAL | Status: AC
  Administered 2014-01-05: 81 mg via ORAL
  Filled 2014-01-05: qty 1

## 2014-01-05 MED ORDER — DIAZEPAM 5 MG PO TABS
5.0000 mg | ORAL_TABLET | ORAL | Status: AC
  Administered 2014-01-05: 5 mg via ORAL
  Filled 2014-01-05: qty 1

## 2014-01-05 MED ORDER — HYDROCHLOROTHIAZIDE 25 MG PO TABS
25.0000 mg | ORAL_TABLET | Freq: Every day | ORAL | Status: DC
  Administered 2014-01-06: 09:00:00 25 mg via ORAL
  Filled 2014-01-05: qty 1

## 2014-01-05 MED ORDER — HEPARIN SODIUM (PORCINE) 1000 UNIT/ML IJ SOLN
INTRAMUSCULAR | Status: AC
  Filled 2014-01-05: qty 1

## 2014-01-05 MED ORDER — HYDROCODONE-ACETAMINOPHEN 5-325 MG PO TABS: 1.0000 | ORAL_TABLET | ORAL | Status: DC | PRN

## 2014-01-05 MED ORDER — HEPARIN (PORCINE) IN NACL 2-0.9 UNIT/ML-% IJ SOLN
INTRAMUSCULAR | Status: AC
  Filled 2014-01-05: qty 500

## 2014-01-05 MED ORDER — SODIUM CHLORIDE 0.9 % IV SOLN
INTRAVENOUS | Status: DC
  Administered 2014-01-05: 10:00:00 via INTRAVENOUS

## 2014-01-05 MED ORDER — SODIUM CHLORIDE 0.9 % IV SOLN: INTRAVENOUS | Status: AC

## 2014-01-05 MED ORDER — ALPRAZOLAM 0.25 MG PO TABS
0.2500 mg | ORAL_TABLET | Freq: Every evening | ORAL | Status: DC | PRN
  Administered 2014-01-05: 23:00:00 0.25 mg via ORAL
  Filled 2014-01-05: qty 1

## 2014-01-05 MED ORDER — LEVOTHYROXINE SODIUM 75 MCG PO TABS
75.0000 ug | ORAL_TABLET | Freq: Every day | ORAL | Status: DC
  Administered 2014-01-06: 06:00:00 75 ug via ORAL
  Filled 2014-01-05 (×2): qty 1

## 2014-01-05 MED ORDER — HYDRALAZINE HCL 20 MG/ML IJ SOLN: 10.0000 mg | INTRAMUSCULAR | Status: DC

## 2014-01-05 NOTE — CV Procedure (Signed)
Jennifer Atkinson is a 80 y.o. female    [id number] LOCATION:  FACILITY: [place]  PHYSICIAN: Quay Burow, M.D. [birth year]   DATE OF PROCEDURE:  01/05/2014  DATE OF DISCHARGE:     PV Angiogram/Intervention    History obtained from chart review.She is a 80 year old mildly overweight widowed Caucasian female, mother of 72 and grandmother to 44 grandchildren, accompanied by one of her daughters today. She was originally referred to me for claudication.   I performed angiogram on her Jan 25, 2012, and revealed segmentally stenosed mid right SFA, which I stented resulting in improvement in her Dopplers; however, her symptoms did not significantly improve. She ultimately had back surgery, which resulted in marked improvement in her symptoms. Followup Dopplers performed December 02, 2012, showed that her stent was functionally occluded with a decrease in her ABIs from the 0.8 range to 0.43, though she denies claudication. In addition, she denies chest pain or shortness of breath. She did have a negative Myoview.  Since I saw her back in May she developed pain in her right foot. It was  not clear whether this was ischemic versus orthopedic.I angiogrammed her on 04/08/13 revealing an occluded right SFA. I ultimately percutaneously revascularized her with overlapping Viabahn  covered stents. Preprocedure ABI was 0.95 and she have marked improvement in her claudication symptoms. Over the last several months she's had recurrent right calf claudication with decline in her ABI to .81. She's developed a new high-frequency signal at the origin of her right SFA.  She presents today for Potential re\re intervention at the ostium of her right SFA.    PROCEDURE DESCRIPTION:   The patient was brought to the second floor [facility] cath lab in the postabsorptive state. She was  premedicated with Valium 5 mg by mouth. Her left groinwas prepped and shaved in usual sterile fashion. Xylocaine 1% was used for local  anesthesia. A 6 French sheath was inserted into the left common femoral artery using standard Seldinger technique. A 5 French crossover catheter and endhole catheter was used to obtain contralateral access. Right lower chin angiography was performed using bolus chase digital subtraction spelled table technique. Visipaque dye was used for the entirety of the case (93 cc the patient). Retrograde aorta pressure was monitored during the case.  HEMODYNAMICS:    AO SYSTOLIC/AO DIASTOLIC: 315/40   Angiographic Data:   1: Right lower extremity-there was a 95% proximal right SFA in-stent restenosis with 2 vessel runoff. This was within the previously placed Viabahn Stent.  IMPRESSION:in-stent restenosis within the previously placed Viabahn Stent  at its leading edge. Plan PTA and restenting.  Procedure Description:the patient received 8000 units of heparin intravenously with an ACT of 215. A 6 French/45 cm Ansel sheath was used to obtain contralateral access. A0 0.14/300 cm length [place] core wire was used to cross the lesion and predilated with a 5 mm x 2 cm 0 point one 8 mm balloon. Following this a 6 mm x 2.5 cm Viabahn  covered stent was carefully positioned angiographically and deployed at the ostium of the right SFA covering the previously dilated lesion. This was then post dilated with a 6 mm x 2 cm balloon resulting in reduction of a 95% stenosis to 0% residual. The Ansel sheath was then withdrawn across the bifurcation and exchanged over a 0.35 mm wire for a short 6 [place] sheath.  Final Impression: successful PTA and restenting of the origin of the right SFA for "in-stent restenosis with a Viabahn covered  stent. The patient left the lab in stable condition. The sheath will be removed and pressure held on the groin to achieve hemostasis once the ACT falls below 170. She will continue with the dual antibiotic therapy, be hydrated overnight and discharged him in the morning. We'll get followup arterial  Doppler studies of her lower extremities and are [place] office in one to 2 weeks after which she will see me back.    [name]. MD, [facility] 01/05/2014 1:56 PM

## 2014-01-05 NOTE — Progress Notes (Signed)
Site area: left groin  Site Prior to Removal:  Level 0  Pressure Applied For 20 MINUTES    Minutes Beginning at 1545  Manual:   yes  Patient Status During Pull:  stable  Post Pull Groin Site:  Level 0  Post Pull Instructions Given:  yes  Post Pull Pulses Present:  yes  Dressing Applied:  yes  Comments:  Gauze pressure dressing applied.

## 2014-01-05 NOTE — Interval H&P Note (Signed)
History and Physical Interval Note:  01/05/2014 12:44 PM  Jennifer Atkinson  has presented today for surgery, with the diagnosis of PAD  The various methods of treatment have been discussed with the patient and family. After consideration of risks, benefits and other options for treatment, the patient has consented to  Procedure(s): LOWER EXTREMITY ANGIOGRAM (Left) as a surgical intervention .  The patient's history has been reviewed, patient examined, no change in status, stable for surgery.  I have reviewed the patient's chart and labs.  Questions were answered to the patient's satisfaction.     [name]

## 2014-01-05 NOTE — H&P (View-Only) (Signed)
12/10/2013 [name]   06/13/1920  [id number]  Primary Physician Donnie Coffin, MD Primary Cardiologist: [name] MD Renae Gloss   HPI:  She is a 80 year old mildly overweight widowed Caucasian female, mother of 37 and grandmother to 33 grandchildren, accompanied by one of her daughters today. She was originally referred to me for claudication.   I performed angiogram on her Jan 25, 2012, and revealed segmentally stenosed mid right SFA, which I stented resulting in improvement in her Dopplers; however, her symptoms did not significantly improve. She ultimately had back surgery, which resulted in marked improvement in her symptoms. Followup Dopplers performed December 02, 2012, showed that her stent was functionally occluded with a decrease in her ABIs from the 0.8 range to 0.43, though she denies claudication. In addition, she denies chest pain or shortness of breath. She did have a negative Myoview.  Since I saw her back in May she developed pain in her right foot. Some wan not clear whether this was ischemic versus orthopedic.I examined her on 04/08/13 revealing an occluded right SFA. I ultimately percutaneously revascularized her with overlapping bye-bye and covered stents. Preprocedure ABI was 0.95 and she had marked improvement in her claudication symptoms. Over the last several months she's had recurrent right calf claudication with decline in her ABI to .81. She's developed a new high-frequency signal at the origin of her right SFA.  Current Outpatient Prescriptions  Medication Sig Dispense Refill  . acetaminophen (TYLENOL) 325 MG tablet Take 2 tablets (650 mg total) by mouth every 4 (four) hours as needed for pain.      Marland Kitchen ALPRAZolam (XANAX) 0.25 MG tablet Take 0.25 mg by mouth at bedtime as needed. For sleep      . aspirin 81 MG chewable tablet Chew 81 mg by mouth daily.      . Calcium Carbonate-Vitamin D (CALCIUM-VITAMIN D) 500-200 MG-UNIT per tablet Take 1 tablet by  mouth daily.       . clopidogrel (PLAVIX) 75 MG tablet Take 1 tablet (75 mg total) by mouth daily. Please call for an appointment.  90 tablet  0  . fish oil-omega-3 fatty acids 1000 MG capsule Take 1 g by mouth daily.       . Glucosamine-Chondroitin (OSTEO BI-FLEX REGULAR STRENGTH PO) Take 1 tablet by mouth daily.       . hydrochlorothiazide 25 MG tablet Take 25 mg by mouth daily.        Marland Kitchen HYDROcodone-acetaminophen (NORCO/VICODIN) 5-325 MG per tablet Take 1-2 tablets by mouth every 4 (four) hours as needed for pain.  50 tablet  0  . Lactobacillus (ACIDOPHILUS) CAPS Take 1 capsule by mouth daily.      Marland Kitchen levothyroxine (SYNTHROID, LEVOTHROID) 75 MCG tablet Take 75 mcg by mouth daily before breakfast.      . omeprazole (PRILOSEC) 10 MG capsule Take 20 mg by mouth daily.        No current facility-administered medications for this visit.    Allergies  Allergen Reactions  . Nsaids     Increased Creatinine    History   Social History  . Marital Status: Widowed    Spouse Name: N/A    Number of Children: N/A  . Years of Education: N/A   Occupational History  . Not on file.   Social History Main Topics  . Smoking status: Never Smoker   . Smokeless tobacco: Never Used  . Alcohol Use: 2.4 oz/week    4 Glasses of wine per  week  . Drug Use: No  . Sexual Activity: No   Other Topics Concern  . Not on file   Social History Narrative  . No narrative on file     Review of Systems: General: negative for chills, fever, night sweats or weight changes.  Cardiovascular: negative for chest pain, dyspnea on exertion, edema, orthopnea, palpitations, paroxysmal nocturnal dyspnea or shortness of breath Dermatological: negative for rash Respiratory: negative for cough or wheezing Urologic: negative for hematuria Abdominal: negative for nausea, vomiting, diarrhea, bright red blood per rectum, melena, or hematemesis Neurologic: negative for visual changes, syncope, or dizziness All other systems  reviewed and are otherwise negative except as noted above.    Blood pressure 142/82, pulse 73, height 5\' 6"  (1.676 m), weight 165 lb (74.844 kg).  General appearance: alert and no distress Neck: no adenopathy, no carotid bruit, no JVD, supple, symmetrical, trachea midline and thyroid not enlarged, symmetric, no tenderness/mass/nodules Lungs: clear to auscultation bilaterally Heart: regular rate and rhythm, S1, S2 normal, no murmur, click, rub or gallop Extremities: extremities normal, atraumatic, no cyanosis or edema  EKG normal sinus rhythm at 73 without ST or T wave changes  ASSESSMENT AND PLAN:   Critical lower limb ischemia- ISR Rt SFA Rx'd with PTA 04/08/13 Status post stenting of her right SFA 01/24/17. She had subsequent occlusion of the stent with worsening symptoms and ABIs down from 22.43. I re\re angiogram for a cholesterol/14 and percutaneously revascularize her entire right SFA with overlapping Viabahn  covered stents up to the near origin of the SFA.her followup lower extremity arterial Doppler studies performed 05/01/13 revealed an increase of her right ABI 0.95 with acceptable velocities. She developed progressive claudication of her right calf a recent job was performed 11/18/13 revealed a high-frequency signal at the origin of her right SFA with a decline in her right ABI from 0.9 5.81.  Based on this I suspect that she developed progression of disease at the origin of her right SFA. I'm going to proceed with angiography and re-intervention.      [name] MD FACP,FACC,FAHA, [facility] 12/10/2013 3:43 PM

## 2014-01-06 ENCOUNTER — Other Ambulatory Visit: Payer: Self-pay | Admitting: Physician Assistant

## 2014-01-06 DIAGNOSIS — N182 Chronic kidney disease, stage 2 (mild): Secondary | ICD-10-CM | POA: Diagnosis not present

## 2014-01-06 DIAGNOSIS — I739 Peripheral vascular disease, unspecified: Secondary | ICD-10-CM

## 2014-01-06 DIAGNOSIS — E785 Hyperlipidemia, unspecified: Secondary | ICD-10-CM

## 2014-01-06 LAB — CBC
HEMATOCRIT: 32.6 % — AB (ref 36.0–46.0)
Hemoglobin: 10.8 g/dL — ABNORMAL LOW (ref 12.0–15.0)
MCH: 30 pg (ref 26.0–34.0)
MCHC: 33.1 g/dL (ref 30.0–36.0)
MCV: 90.6 fL (ref 78.0–100.0)
Platelets: 248 10*3/uL (ref 150–400)
RBC: 3.6 MIL/uL — ABNORMAL LOW (ref 3.87–5.11)
RDW: 14.6 % (ref 11.5–15.5)
WBC: 6.4 10*3/uL (ref 4.0–10.5)

## 2014-01-06 LAB — BASIC METABOLIC PANEL
BUN: 24 mg/dL — AB (ref 6–23)
CALCIUM: 8.4 mg/dL (ref 8.4–10.5)
CHLORIDE: 103 meq/L (ref 96–112)
CO2: 25 meq/L (ref 19–32)
Creatinine, Ser: 1.25 mg/dL — ABNORMAL HIGH (ref 0.50–1.10)
GFR calc Af Amer: 42 mL/min — ABNORMAL LOW (ref >90)
GFR calc non Af Amer: 36 mL/min — ABNORMAL LOW (ref >90)
Glucose, Bld: 96 mg/dL (ref 70–99)
Potassium: 3.7 mEq/L (ref 3.7–5.3)
Sodium: 138 mEq/L (ref 137–147)

## 2014-01-06 MED ORDER — ATORVASTATIN CALCIUM 10 MG PO TABS: 10.0000 mg | ORAL_TABLET | Freq: Every day | ORAL | Status: DC

## 2014-01-06 MED ORDER — ATORVASTATIN CALCIUM 10 MG PO TABS
10.0000 mg | ORAL_TABLET | Freq: Every day | ORAL | Status: DC
  Filled 2014-01-06: qty 1

## 2014-01-06 NOTE — Progress Notes (Signed)
Subjective: No complaints.  Left groin is a little sore  Objective: Vital signs in last 24 hours: Temp:  [97.6 F (36.4 C)-98.3 F (36.8 C)] 97.6 F (36.4 C) (05/12 0544) Pulse Rate:  [62-75] 72 (05/12 0544) Resp:  [16-20] 20 (05/12 0544) BP: (114-158)/(40-81) 114/43 mmHg (05/12 0544) SpO2:  [20 %-99 %] 94 % (05/12 0544) Weight:  [160 lb (72.576 kg)-160 lb 4.4 oz (72.7 kg)] 160 lb 4.4 oz (72.7 kg) (05/12 0028) Last BM Date: 01/04/14  Intake/Output from previous day: 05/11 0701 - 05/12 0700 In: 570 [P.O.:120; I.V.:450] Out: 2000 [Urine:2000] Intake/Output this shift: Total I/O In: -  Out: 950 [Urine:950]  Medications Current Facility-Administered Medications  Medication Dose Route Frequency Provider Last Rate Last Dose  . ALPRAZolam Duanne Moron) tablet 0.25 mg  0.25 mg Oral QHS PRN [name], MD   0.25 mg at 01/05/14 2322  . aspirin chewable tablet 81 mg  81 mg Oral Daily [name], MD      . clopidogrel (PLAVIX) tablet 75 mg  75 mg Oral Daily [name], MD      . hydrALAZINE (APRESOLINE) injection 10 mg  10 mg Intravenous UD [name], MD      . hydrochlorothiazide (HYDRODIURIL) tablet 25 mg  25 mg Oral Daily [name], MD      . HYDROcodone-acetaminophen (NORCO/VICODIN) 5-325 MG per tablet 1-2 tablet  1-2 tablet Oral Q4H PRN [name], MD      . levothyroxine (SYNTHROID, LEVOTHROID) tablet 75 mcg  75 mcg Oral QAC breakfast [name], MD   75 mcg at 01/06/14 [phone]  . morphine 2 MG/ML injection 2 mg  2 mg Intravenous Q1H PRN [name], MD      . pantoprazole (PROTONIX) EC tablet 40 mg  40 mg Oral Daily [name], MD   40 mg at 01/05/14 2204    PE: General appearance: alert, cooperative and no distress Lungs: clear to auscultation bilaterally Heart: regular rate and rhythm, S1, S2 normal, no murmur, click, rub or gallop Extremities: No LEE Pulses: 2+ and symmetric Right: 1+ PT/DP.  2+ left.  Skin: Warm and dry.   No hematoma or ecchymosis in the left groin.  mildly tender Neurologic: Grossly normal  Lab Results:   Recent Labs  01/06/14 0350  WBC 6.4  HGB 10.8*  HCT 32.6*  PLT 248   BMET  Recent Labs  01/06/14 0350  NA 138  K 3.7  CL 103  CO2 25  GLUCOSE 96  BUN 24*  CREATININE 1.25*  CALCIUM 8.4      Assessment/Plan   Active Problems:   Chronic renal disease, stage II   HTN (hypertension)   Dyslipidemia   Claudication   PAD  Plan:  SP successful PTA and restenting of the origin of the right SFA for "in-stent restenosis with a Viabahn covered stent.  SCr stable.  BP and HR stable.   LEA dopplers in 2-3 weeks and FU with [name]   LOS: 1 day    Tarri Fuller PA-C 01/06/2014 6:26 AM  Patient seen and examined and history reviewed. Agree with above findings and plan. Patient is feeling very well today. No claudication. Left groin with mild superficial bruising but no hematoma or bruit. Pulses are palpable. Continue ASA and Plavix. Plan DC today. Follow up with [name] in 2 weeks with dopplers. Patient has never taken statins for cholesterol. Had problems with Niacin in past.  With recurrent PAD/stenosis I think a statin is advisable. With advanced age will start with lipitor 10 mg. Follow up lipids and LFTs in 4-6 weeks.   Ander Slade [facility] 01/06/2014 6:43 AM

## 2014-01-06 NOTE — Discharge Summary (Signed)
Physician Discharge Summary     Patient ID: Jennifer Atkinson MRN: [id number] DOB/AGE: [birth year] 78 y.o.  Admit date: 01/05/2014 Discharge date: 01/06/2014  Admission Diagnoses:  PAD/ claudication  Discharge Diagnoses:  Active Problems:   Chronic renal disease, stage II   HTN (hypertension)   Dyslipidemia   Claudication   Discharged Condition: stable  Hospital Course:  She is a 80 year old mildly overweight widowed Caucasian female, mother of 81 and grandmother to 64 grandchildren, accompanied by one of her daughters today. She was originally referred for claudication.   [name] performed angiogram on her Jan 25, 2012, and revealed segmentally stenosed mid right SFA, which was stented resulting in improvement in her Dopplers; however, her symptoms did not significantly improve. She ultimately had back surgery, which resulted in marked improvement in her symptoms. Followup Dopplers performed December 02, 2012, showed that her stent was functionally occluded with a decrease in her ABIs from the 0.8 range to 0.43, though she denies claudication. In addition, she denies chest pain or shortness of breath. She did have a negative Myoview.  Since [name] saw her back in May she developed pain in her right foot. It was not clear whether this was ischemic versus orthopedic. He angiogrammed her on 04/08/13 revealing an occluded right SFA. She was percutaneously revascularized her with overlapping Viabahn covered stents. Preprocedure ABI was 0.95 and she have marked improvement in her claudication symptoms. Over the last several months she's had recurrent right calf claudication with decline in her ABI to .81. She's developed a new high-frequency signal at the origin of her right SFA. She presents today for Potential re\re intervention at the ostium of her right SFA.  She presented for successful PTA and restenting of the origin of the right SFA for "in-stent restenosis with a Viabahn covered stent. She was  started on a statin. The patient was seen by Dr. Martinique who felt she was stable for DC home. Follow up dopplers arranged. Check LFT's in 4-6 weeks.    Consults: None    Significant Diagnostic Studies:  HEMODYNAMICS:  AO SYSTOLIC/AO DIASTOLIC: 657/84  Angiographic Data:  1: Right lower extremity-there was a 78% proximal right SFA in-stent restenosis with 2 vessel runoff. This was within the previously placed Viabahn Stent.  IMPRESSION:in-stent restenosis within the previously placed Viabahn Stent at its leading edge. Plan PTA and restenting.  Procedure Description:the patient received 8000 units of heparin intravenously with an ACT of 215. A 6 French/45 cm Ansel sheath was used to obtain contralateral access. A0 0.14/300 cm length [place] core wire was used to cross the lesion and predilated with a 5 mm x 2 cm 0 point one 8 mm balloon. Following this a 6 mm x 2.5 cm Viabahn covered stent was carefully positioned angiographically and deployed at the ostium of the right SFA covering the previously dilated lesion. This was then post dilated with a 6 mm x 2 cm balloon resulting in reduction of a 95% stenosis to 0% residual. The Ansel sheath was then withdrawn across the bifurcation and exchanged over a 0.35 mm wire for a short 6 [place] sheath.  Final Impression: successful PTA and restenting of the origin of the right SFA for "in-stent restenosis with a Viabahn covered stent. The patient left the lab in stable condition. The sheath will be removed and pressure held on the groin to achieve hemostasis once the ACT falls below 170. She will continue with the dual antibiotic therapy, be hydrated overnight and discharged him in  the morning. We'll get followup arterial Doppler studies of her lower extremities and are [facility] office in one to 2 weeks after which she will see me back.  [name]. MD, [facility]  01/05/2014  Treatments: See above  Discharge Exam: Blood pressure 114/43, pulse 72, temperature  97.6 F (36.4 C), temperature source Oral, resp. rate 20, height 5\' 6"  (1.676 m), weight 160 lb 4.4 oz (72.7 kg), SpO2 94.00%.   Disposition: 01-Home or Self Care  Discharge Orders   Future Appointments Provider Department Dept Phone   02/24/2014 10:00 AM [name], MD [facility] [phone]   Future Orders Complete By Expires   Call MD for:  redness, tenderness, or signs of infection (pain, swelling, redness, odor or green/yellow discharge around incision site)  As directed    Diet - low sodium heart healthy  As directed    Discharge instructions  As directed    Increase activity slowly  As directed        Medication List         ALPRAZolam 0.25 MG tablet  Commonly known as:  XANAX  Take 0.25 mg by mouth at bedtime as needed. For sleep     aspirin 81 MG chewable tablet  Chew 81 mg by mouth daily.     atorvastatin 10 MG tablet  Commonly known as:  LIPITOR  Take 1 tablet (10 mg total) by mouth daily at 6 PM.     clopidogrel 75 MG tablet  Commonly known as:  PLAVIX  Take 1 tablet (75 mg total) by mouth daily. Please call for an appointment.     hydrochlorothiazide 25 MG tablet  Commonly known as:  HYDRODIURIL  Take 25 mg by mouth daily.     HYDROcodone-acetaminophen 5-325 MG per tablet  Commonly known as:  NORCO/VICODIN  Take 1-2 tablets by mouth every 4 (four) hours as needed for pain.     levothyroxine 75 MCG tablet  Commonly known as:  SYNTHROID, LEVOTHROID  Take 75 mcg by mouth daily before breakfast.     omeprazole 10 MG capsule  Commonly known as:  PRILOSEC  Take 20 mg by mouth daily.           Follow-up Information   Follow up with [name], MD On 02/24/2014. (10:00AM)    Specialty:  Cardiology   Contact information:   [address] [place] [address] [phone]       Signed: Tarri Fuller, [facility] 01/06/2014, 8:20 AM  Patient seen and examined and history reviewed. Agree with above findings and plan.  See my earlier rounding note.  Ander Slade [facility] 01/06/2014 9:29 AM

## 2014-01-06 NOTE — Progress Notes (Deleted)
Physician Discharge Summary      Patient ID: BERNIE FOBES MRN: [id number] DOB/AGE: [birth year] 80 y.o.  Admit date: 01/05/2014 Discharge date: 01/06/2014 Admission Diagnoses:  Claudication, PAD  Discharge Diagnoses:  Active Problems:   Chronic renal disease, stage II   HTN (hypertension)   Dyslipidemia   Claudication   PAD  Discharged Condition: stable  Hospital Course:   She is a 80 year old mildly overweight widowed Caucasian female, mother of 31 and grandmother to 39 grandchildren, accompanied by one of her daughters today. She was originally referred for claudication.   [name] performed angiogram on her Jan 25, 2012, and revealed segmentally stenosed mid right SFA, which was stented resulting in improvement in her Dopplers; however, her symptoms did not significantly improve. She ultimately had back surgery, which resulted in marked improvement in her symptoms. Followup Dopplers performed December 02, 2012, showed that her stent was functionally occluded with a decrease in her ABIs from the 0.8 range to 0.43, though she denies claudication. In addition, she denies chest pain or shortness of breath. She did have a negative Myoview.   Since [name] saw her back in May she developed pain in her right foot. It was not clear whether this was ischemic versus orthopedic.  He angiogrammed her on 04/08/13 revealing an occluded right SFA.   She was percutaneously revascularized her with overlapping Viabahn covered stents. Preprocedure ABI was 0.95 and she have marked improvement in her claudication symptoms. Over the last several months she's had recurrent right calf claudication with decline in her ABI to .81. She's developed a new high-frequency signal at the origin of her right SFA. She presents today for Potential re\re intervention at the ostium of her right SFA.  She presented for successful PTA and restenting of the origin of the right SFA for "in-stent restenosis with a Viabahn covered  stent.  She was started on a statin.  The patient was seen by Dr. Martinique who felt she was stable for DC home.  Follow up dopplers arranged.  Check LFT's in 4-6 weeks.   Consults: None  Significant Diagnostic Studies: HEMODYNAMICS:  AO SYSTOLIC/AO DIASTOLIC: 009/38  Angiographic Data:  1: Right lower extremity-there was a 95% proximal right SFA in-stent restenosis with 2 vessel runoff. This was within the previously placed Viabahn Stent.  IMPRESSION:in-stent restenosis within the previously placed Viabahn Stent at its leading edge. Plan PTA and restenting.  Procedure Description:the patient received 8000 units of heparin intravenously with an ACT of 215. A 6 French/45 cm Ansel sheath was used to obtain contralateral access. A0 0.14/300 cm length [place] core wire was used to cross the lesion and predilated with a 5 mm x 2 cm 0 point one 8 mm balloon. Following this a 6 mm x 2.5 cm Viabahn covered stent was carefully positioned angiographically and deployed at the ostium of the right SFA covering the previously dilated lesion. This was then post dilated with a 6 mm x 2 cm balloon resulting in reduction of a 95% stenosis to 0% residual. The Ansel sheath was then withdrawn across the bifurcation and exchanged over a 0.35 mm wire for a short 6 [place] sheath.  Final Impression: successful PTA and restenting of the origin of the right SFA for "in-stent restenosis with a Viabahn covered stent. The patient left the lab in stable condition. The sheath will be removed and pressure held on the groin to achieve hemostasis once the ACT falls below 170. She will continue with the dual antibiotic therapy,  be hydrated overnight and discharged him in the morning. We'll get followup arterial Doppler studies of her lower extremities and are [facility] office in one to 2 weeks after which she will see me back.  [name]. MD, [facility]  01/05/2014  Treatments: See above  Discharge Exam: Blood pressure 114/43, pulse 72,  temperature 97.6 F (36.4 C), temperature source Oral, resp. rate 20, height 5\' 6"  (1.676 m), weight 160 lb 4.4 oz (72.7 kg), SpO2 94.00%.   Disposition: 01-Home or Self Care       Future Appointments Provider Department Dept Phone   02/24/2014 10:00 AM [name], MD [facility] [phone]       Medication List    ASK your doctor about these medications       ALPRAZolam 0.25 MG tablet  Commonly known as:  XANAX  Take 0.25 mg by mouth at bedtime as needed. For sleep     aspirin 81 MG chewable tablet  Chew 81 mg by mouth daily.     clopidogrel 75 MG tablet  Commonly known as:  PLAVIX  Take 1 tablet (75 mg total) by mouth daily. Please call for an appointment.     hydrochlorothiazide 25 MG tablet  Commonly known as:  HYDRODIURIL  Take 25 mg by mouth daily.     HYDROcodone-acetaminophen 5-325 MG per tablet  Commonly known as:  NORCO/VICODIN  Take 1-2 tablets by mouth every 4 (four) hours as needed for pain.     levothyroxine 75 MCG tablet  Commonly known as:  SYNTHROID, LEVOTHROID  Take 75 mcg by mouth daily before breakfast.     omeprazole 10 MG capsule  Commonly known as:  PRILOSEC  Take 20 mg by mouth daily.       Follow-up Information   Follow up with [name], MD On 02/24/2014. (10:00AM)    Specialty:  Cardiology   Contact information:   [address] [place] [address] [phone]       Signed: Tarri Fuller 01/06/2014, 8:15 AM

## 2014-01-20 ENCOUNTER — Encounter: Payer: Self-pay | Admitting: Cardiology

## 2014-01-20 ENCOUNTER — Ambulatory Visit (INDEPENDENT_AMBULATORY_CARE_PROVIDER_SITE_OTHER): Payer: Medicare Other | Admitting: Cardiology

## 2014-01-20 VITALS — BP 170/75 | HR 73 | Ht 66.0 in | Wt 166.8 lb

## 2014-01-20 DIAGNOSIS — I739 Peripheral vascular disease, unspecified: Secondary | ICD-10-CM

## 2014-01-20 DIAGNOSIS — I10 Essential (primary) hypertension: Secondary | ICD-10-CM

## 2014-01-20 DIAGNOSIS — E785 Hyperlipidemia, unspecified: Secondary | ICD-10-CM

## 2014-01-20 NOTE — Progress Notes (Signed)
01/20/2014 Jennifer Atkinson   November 03, 1919  [id number]  Primary Physicia Donnie Coffin, MD Primary Cardiologist: Dr Gwenlyn Found  HPI:  80 y/o female who will be 47 in July and looks much younger than this. She has PVD. She had a Rt SFA PTA/stent in May 2013 after she presented with leg pain with abnormal ABIs. Her symptoms did not improve and she eventually had back surgery which did help her leg pain. In Aug 2014 she had claudication and underwent another Rt SFA PTA/stent using a Viabahn stent. She again presented in March 2015 with claudication. Her podiatrist, Dr Amalia Hailey sent her for doppler and she was told these were "OK- 75%". Dr Amalia Hailey referred her to Dr Gwenlyn Found for further evaluation. Her dopplers here revealed a high-frequency signal at the origin of her right SFA with a decline in her right ABI from 0.9 5.81 She underwent peripheral angiogram 01/05/14 and elective PTA and stenting for ISR of the previously placed Rt SFA Viabahn stent. She is in the office today for follow up. She has done well, she no longer has Rt calf pain. She wants to know if she can see her podiatrist about having a chronically ingrown toenail fixed.     Current Outpatient Prescriptions  Medication Sig Dispense Refill  . ALPRAZolam (XANAX) 0.25 MG tablet Take 0.25 mg by mouth at bedtime as needed. For sleep      . aspirin 81 MG chewable tablet Chew 81 mg by mouth daily.      Marland Kitchen atorvastatin (LIPITOR) 10 MG tablet Take 1 tablet (10 mg total) by mouth daily at 6 PM.  30 tablet  5  . clopidogrel (PLAVIX) 75 MG tablet Take 1 tablet (75 mg total) by mouth daily. Please call for an appointment.  90 tablet  0  . hydrochlorothiazide 25 MG tablet Take 25 mg by mouth daily.        Marland Kitchen HYDROcodone-acetaminophen (NORCO/VICODIN) 5-325 MG per tablet Take 1-2 tablets by mouth every 4 (four) hours as needed for pain.  50 tablet  0  . levothyroxine (SYNTHROID, LEVOTHROID) 75 MCG tablet Take 75 mcg by mouth daily before breakfast.      .  omeprazole (PRILOSEC) 10 MG capsule Take 20 mg by mouth daily.        No current facility-administered medications for this visit.    Allergies  Allergen Reactions  . Nsaids     Increased Creatinine    History   Social History  . Marital Status: Widowed    Spouse Name: N/A    Number of Children: N/A  . Years of Education: N/A   Occupational History  . Not on file.   Social History Main Topics  . Smoking status: Never Smoker   . Smokeless tobacco: Never Used  . Alcohol Use: 2.4 oz/week    4 Glasses of wine per week  . Drug Use: No  . Sexual Activity: No   Other Topics Concern  . Not on file   Social History Narrative  . No narrative on file     Review of Systems: General: negative for chills, fever, night sweats or weight changes.  Cardiovascular: negative for chest pain, dyspnea on exertion, edema, orthopnea, palpitations, paroxysmal nocturnal dyspnea or shortness of breath Dermatological: negative for rash Respiratory: negative for cough or wheezing Urologic: negative for hematuria Abdominal: negative for nausea, vomiting, diarrhea, bright red blood per rectum, melena, or hematemesis Neurologic: negative for visual changes, syncope, or dizziness All other systems reviewed and are  otherwise negative except as noted above.    Blood pressure 170/75, pulse 73, height 5\' 6"  (1.676 m), weight 166 lb 12.8 oz (75.66 kg).  General appearance: alert, cooperative, no distress and looks younger than her stated age Lungs: clear to auscultation bilaterally and kyphoscoliosis Heart: regular rate and rhythm and 2/6 systolic murmur Extremities: warm bilat, diminnished pulses bilat, She Rt groin ecchymosis without hematoma or bruit    ASSESSMENT AND PLAN:   PVD, Rt SFA May 2013, Aug 2014, May 2015 (ISR) Doing well since recent PTA for ISR RSFA.  HTN (hypertension) Her B/P is a little elevated today, she say this is very unusual for her.   Dyslipidemia Low dose statin  added.   PLAN  I discussed Jennifer Atkinson's case with Dr Gwenlyn Found. She is for LEA dopplers later this week. We suggested she contact Dr Amalia Hailey and go ahead with her ingrown toenail repair assuming her dopplers confirm re established flow. Dr Gwenlyn Found will see her in June. She will keep an eye on her B/P and report to Dr Gwenlyn Found. She is tolerating Lipitor 10 mg which was recently added in hopes of preventing restenosis oh her stent.   Doreene Burke [facility] 01/20/2014 12:05 PM

## 2014-01-20 NOTE — Assessment & Plan Note (Signed)
Low dose statin added.

## 2014-01-20 NOTE — Patient Instructions (Signed)
Keep Appointment with Dr Gwenlyn Found

## 2014-01-20 NOTE — Assessment & Plan Note (Addendum)
Doing well since recent PTA for ISR RSFA.

## 2014-01-20 NOTE — Assessment & Plan Note (Signed)
Her B/P is a little elevated today, she say this is very unusual for her.

## 2014-01-22 ENCOUNTER — Ambulatory Visit (HOSPITAL_COMMUNITY)
Admission: RE | Admit: 2014-01-22 | Discharge: 2014-01-22 | Disposition: A | Discharge status: Home / Self Care | Payer: Medicare Other | Source: Ambulatory Visit | Attending: Cardiovascular Disease | Admitting: Cardiovascular Disease

## 2014-01-22 DIAGNOSIS — I739 Peripheral vascular disease, unspecified: Secondary | ICD-10-CM

## 2014-01-22 NOTE — Progress Notes (Signed)
Lower Extremity Arterial Duplex Completed. °Brianna L Mazza,RVT °

## 2014-01-23 ENCOUNTER — Encounter: Payer: Self-pay | Admitting: Cardiovascular Disease

## 2014-01-27 ENCOUNTER — Telehealth: Payer: Self-pay | Admitting: Cardiovascular Disease

## 2014-01-27 ENCOUNTER — Other Ambulatory Visit: Payer: Self-pay | Admitting: Cardiovascular Disease

## 2014-01-27 DIAGNOSIS — I739 Peripheral vascular disease, unspecified: Secondary | ICD-10-CM

## 2014-01-27 NOTE — Progress Notes (Signed)
LMTCB regarding test results

## 2014-01-27 NOTE — Telephone Encounter (Signed)
FORWARD to [place]

## 2014-01-27 NOTE — Telephone Encounter (Signed)
Returning call , thinks it was [place] calling about results of test from last week  Please call

## 2014-01-27 NOTE — Telephone Encounter (Signed)
Rx refill sent to patient pharmacy   

## 2014-01-28 NOTE — Telephone Encounter (Signed)
Patient notified of doppler study results.. LM on cell number  "Signif improvement in RSFA velocities and ABI s/p intervention. Repeat n 6 months" Repeat doppler ordered.

## 2014-01-30 ENCOUNTER — Ambulatory Visit: Payer: Medicare Other | Admitting: Cardiovascular Disease

## 2014-02-24 ENCOUNTER — Encounter: Payer: Self-pay | Admitting: Cardiovascular Disease

## 2014-02-24 ENCOUNTER — Ambulatory Visit (INDEPENDENT_AMBULATORY_CARE_PROVIDER_SITE_OTHER): Payer: Medicare Other | Admitting: Cardiovascular Disease

## 2014-02-24 VITALS — BP 116/60 | HR 68 | Ht 66.0 in | Wt 167.6 lb

## 2014-02-24 DIAGNOSIS — E785 Hyperlipidemia, unspecified: Secondary | ICD-10-CM

## 2014-02-24 DIAGNOSIS — Z79899 Other long term (current) drug therapy: Secondary | ICD-10-CM

## 2014-02-24 DIAGNOSIS — I739 Peripheral vascular disease, unspecified: Secondary | ICD-10-CM

## 2014-02-24 DIAGNOSIS — I10 Essential (primary) hypertension: Secondary | ICD-10-CM

## 2014-02-24 DIAGNOSIS — E782 Mixed hyperlipidemia: Secondary | ICD-10-CM | POA: Diagnosis not present

## 2014-02-24 NOTE — Assessment & Plan Note (Signed)
Controlled on current medications 

## 2014-02-24 NOTE — Assessment & Plan Note (Signed)
On statin therapy ,we will recheck a lipid and liver profile

## 2014-02-24 NOTE — Progress Notes (Signed)
02/24/2014 Jennifer Atkinson   1920-02-27  [id number]  Primary Physician Donnie Coffin, MD Primary Cardiologist: [name] MD Renae Gloss   HPI:  She is a 80 year old mildly overweight widowed Caucasian female, mother of 94 and grandmother to 71 grandchildren, accompanied by one of her daughters today. She was originally referred to me for claudication.   I performed angiogram on her Jan 25, 2012, and revealed segmentally stenosed mid right SFA, which I stented resulting in improvement in her Dopplers; however, her symptoms did not significantly improve. She ultimately had back surgery, which resulted in marked improvement in her symptoms. Followup Dopplers performed December 02, 2012, showed that her stent was functionally occluded with a decrease in her ABIs from the 0.8 range to 0.43, though she denies claudication. In addition, she denies chest pain or shortness of breath. She did have a negative Myoview.  Since I saw her back in May she developed pain in her right foot. Some wan not clear whether this was ischemic versus orthopedic.I examined her on 04/08/13 revealing an occluded right SFA. I ultimately percutaneously revascularized her with overlapping bye-bye and covered stents. Preprocedure ABI was 0.95 and she had marked improvement in her claudication symptoms. Over the last several months she's noticed recurrent right calf claudication with decline in her ABI to .81. She's developed a new high-frequency signal at the origin of her right SFA. I re\re intervention on 01/05/14 revealing a high-grade lesion in the proximal right SFA at the leading edge of the previously placed stent. I resected this with a Viabahn covered  stent with excellent angiographic, clinical and ultrasonographic result. She now has claudication.    Current Outpatient Prescriptions  Medication Sig Dispense Refill  . ALPRAZolam (XANAX) 0.25 MG tablet Take 0.25 mg by mouth at bedtime as needed. For sleep       . aspirin 81 MG chewable tablet Chew 81 mg by mouth daily.      Marland Kitchen atorvastatin (LIPITOR) 10 MG tablet Take 1 tablet (10 mg total) by mouth daily at 6 PM.  30 tablet  5  . calcium carbonate (OS-CAL) 600 MG TABS tablet Take 600 mg by mouth daily.      . clopidogrel (PLAVIX) 75 MG tablet Take 1 tablet (75 mg total) by mouth once.  90 tablet  2  . hydrochlorothiazide 25 MG tablet Take 25 mg by mouth daily.        Marland Kitchen HYDROcodone-acetaminophen (NORCO/VICODIN) 5-325 MG per tablet Take 1-2 tablets by mouth every 4 (four) hours as needed for pain.  50 tablet  0  . Lactobacillus (ACIDOPHILUS) CHEW Chew 1 tablet by mouth daily.      Marland Kitchen levothyroxine (SYNTHROID, LEVOTHROID) 75 MCG tablet Take 75 mcg by mouth daily before breakfast.      . Misc Natural Products (OSTEO BI-FLEX ADV DOUBLE ST) TABS Take 1 tablet by mouth daily.      . Omega-3 Fatty Acids (FISH OIL) 1000 MG CAPS Take 1 capsule by mouth daily.      Marland Kitchen omeprazole (PRILOSEC) 10 MG capsule Take 20 mg by mouth daily.        No current facility-administered medications for this visit.    Allergies  Allergen Reactions  . Morphine Other (See Comments)    Other Reaction: GI Upset  . Niacin Other (See Comments)    Other Reaction: Other reaction  . Nsaids     Increased Creatinine  . Oxycodone-Acetaminophen Other (See Comments)    Other Reaction: Other  reaction    History   Social History  . Marital Status: Widowed    Spouse Name: N/A    Number of Children: N/A  . Years of Education: N/A   Occupational History  . Not on file.   Social History Main Topics  . Smoking status: Never Smoker   . Smokeless tobacco: Never Used  . Alcohol Use: 2.4 oz/week    4 Glasses of wine per week  . Drug Use: No  . Sexual Activity: No   Other Topics Concern  . Not on file   Social History Narrative  . No narrative on file     Review of Systems: General: negative for chills, fever, night sweats or weight changes.  Cardiovascular: negative for  chest pain, dyspnea on exertion, edema, orthopnea, palpitations, paroxysmal nocturnal dyspnea or shortness of breath Dermatological: negative for rash Respiratory: negative for cough or wheezing Urologic: negative for hematuria Abdominal: negative for nausea, vomiting, diarrhea, bright red blood per rectum, melena, or hematemesis Neurologic: negative for visual changes, syncope, or dizziness All other systems reviewed and are otherwise negative except as noted above.    Blood pressure 116/60, pulse 68, height 5\' 6"  (1.676 m), weight 167 lb 9.6 oz (76.023 kg).  General appearance: alert and no distress Neck: no adenopathy, no carotid bruit, no JVD, supple, symmetrical, trachea midline and thyroid not enlarged, symmetric, no tenderness/mass/nodules Lungs: clear to auscultation bilaterally Heart: regular rate and rhythm, S1, S2 normal, no murmur, click, rub or gallop Extremities: extremities normal, atraumatic, no cyanosis or edema  EKG not performed today  ASSESSMENT AND PLAN:   HTN (hypertension) Controlled on current medications  Dyslipidemia On statin therapy ,we will recheck a lipid and liver profile  Claudication Status post initial stenting of her right SFA 01/25/12. Because of recurrent symptoms of claudication and Dopplers that suggested a new proximal right SFA lesion on 11/18/13 she underwent re\re angiography on 01/05/14 revealing a 95% proximal in-stent restenosis within the previously placed right SFA stent. This was restented with a Viabahn  stent with excellent angiographic, clinical and ultrasonographic results.      [name] MD FACP,FACC,FAHA, [facility] 02/24/2014 11:30 AM

## 2014-02-24 NOTE — Assessment & Plan Note (Signed)
Status post initial stenting of her right SFA 01/25/12. Because of recurrent symptoms of claudication and Dopplers that suggested a new proximal right SFA lesion on 11/18/13 she underwent re\re angiography on 01/05/14 revealing a 95% proximal in-stent restenosis within the previously placed right SFA stent. This was restented with a Viabahn  stent with excellent angiographic, clinical and ultrasonographic results.

## 2014-02-24 NOTE — Patient Instructions (Signed)
Dr Gwenlyn Found has ordered you to have blood work to be done FASTING. (Lipid and Hepatic Panel)  Your physician has requested that you have a lower extremity arterial duplex to be done in 6 months. This test is an ultrasound of the arteries in the legs. It looks at arterial blood flow in the legs. Allow one hour for Lower Arterial scans. There are no restrictions or special instructions.  Your physician recommends that you schedule a follow-up appointment in 6 months with Dr Gwenlyn Found after your doppler.

## 2014-02-25 LAB — HEPATIC FUNCTION PANEL
ALBUMIN: 4.4 g/dL (ref 3.5–5.2)
ALT: 12 U/L (ref 0–35)
AST: 17 U/L (ref 0–37)
Alkaline Phosphatase: 64 U/L (ref 39–117)
Bilirubin, Direct: 0.1 mg/dL (ref 0.0–0.3)
Indirect Bilirubin: 0.5 mg/dL (ref 0.2–1.2)
TOTAL PROTEIN: 7 g/dL (ref 6.0–8.3)
Total Bilirubin: 0.6 mg/dL (ref 0.2–1.2)

## 2014-02-25 LAB — LIPID PANEL
Cholesterol: 184 mg/dL (ref 0–200)
HDL: 67 mg/dL (ref >39)
LDL Cholesterol: 99 mg/dL (ref 0–99)
Total CHOL/HDL Ratio: 2.7 Ratio
Triglycerides: 88 mg/dL (ref <150)
VLDL: 18 mg/dL (ref 0–40)

## 2014-03-02 ENCOUNTER — Encounter: Payer: Self-pay | Admitting: *Deleted

## 2014-03-10 ENCOUNTER — Telehealth: Payer: Self-pay | Admitting: *Deleted

## 2014-03-10 NOTE — Telephone Encounter (Signed)
Came to him 3 years ago because my toenail was hurting.  He sent me to see [name].  I had stints put in my legs.  I still haven't had my toenail taken care of.  [name] said I can't stop the blood thinner for 6 months.  My toenail is bothering me.  I'd like to have it done before I go out of town with my daughter in August.  Will he do it if I can't stop taking the blood thinner?  Patient called back before I could return her call.  She spoke to Jansen, one of the schedulers.  I asked Marcie Bal to let her know he will not do the procedure with her on a blood thinner but he can trim the nails.  She advised the patient of my response.  She refused the offer to schedule an appointment with Dr. Amalia Hailey for a debridement.  She informed Marcie Bal she was going to go for a pedicure instead.

## 2014-03-12 DIAGNOSIS — H26499 Other secondary cataract, unspecified eye: Secondary | ICD-10-CM | POA: Diagnosis not present

## 2014-04-14 DIAGNOSIS — M25569 Pain in unspecified knee: Secondary | ICD-10-CM | POA: Diagnosis not present

## 2014-04-16 ENCOUNTER — Telehealth: Payer: Self-pay | Admitting: *Deleted

## 2014-04-16 NOTE — Telephone Encounter (Signed)
Patient walked in to inform us that Dr. Aurea Graff PA questioned whether patient should be taking both asa and plavix. Will defer to [name] for review.

## 2014-04-17 NOTE — Telephone Encounter (Signed)
Patient notified

## 2014-04-17 NOTE — Telephone Encounter (Signed)
Yes, pt should be on ASA 81 mg and plavix   JJB

## 2014-04-22 ENCOUNTER — Ambulatory Visit: Payer: Self-pay | Admitting: Podiatry

## 2014-06-08 DIAGNOSIS — I739 Peripheral vascular disease, unspecified: Secondary | ICD-10-CM | POA: Diagnosis not present

## 2014-06-08 DIAGNOSIS — F419 Anxiety disorder, unspecified: Secondary | ICD-10-CM | POA: Diagnosis not present

## 2014-06-08 DIAGNOSIS — I10 Essential (primary) hypertension: Secondary | ICD-10-CM | POA: Diagnosis not present

## 2014-06-08 DIAGNOSIS — N183 Chronic kidney disease, stage 3 (moderate): Secondary | ICD-10-CM | POA: Diagnosis not present

## 2014-06-08 DIAGNOSIS — Z23 Encounter for immunization: Secondary | ICD-10-CM | POA: Diagnosis not present

## 2014-06-08 DIAGNOSIS — K219 Gastro-esophageal reflux disease without esophagitis: Secondary | ICD-10-CM | POA: Diagnosis not present

## 2014-06-08 DIAGNOSIS — E039 Hypothyroidism, unspecified: Secondary | ICD-10-CM | POA: Diagnosis not present

## 2014-06-08 DIAGNOSIS — M150 Primary generalized (osteo)arthritis: Secondary | ICD-10-CM | POA: Diagnosis not present

## 2014-06-10 DIAGNOSIS — R3915 Urgency of urination: Secondary | ICD-10-CM | POA: Diagnosis not present

## 2014-06-10 DIAGNOSIS — N3000 Acute cystitis without hematuria: Secondary | ICD-10-CM | POA: Diagnosis not present

## 2014-06-26 ENCOUNTER — Encounter (HOSPITAL_COMMUNITY): Payer: Self-pay | Admitting: *Deleted

## 2014-07-29 ENCOUNTER — Encounter (HOSPITAL_COMMUNITY): Payer: Medicare Other

## 2014-08-06 ENCOUNTER — Encounter (HOSPITAL_COMMUNITY): Payer: Self-pay | Admitting: Cardiovascular Disease

## 2014-08-12 ENCOUNTER — Encounter (HOSPITAL_COMMUNITY): Payer: Medicare Other

## 2014-08-31 ENCOUNTER — Ambulatory Visit (HOSPITAL_COMMUNITY)
Admission: RE | Admit: 2014-08-31 | Discharge: 2014-08-31 | Disposition: A | Discharge status: Home / Self Care | Payer: Medicare Other | Source: Ambulatory Visit | Attending: Cardiovascular Disease | Admitting: Cardiovascular Disease

## 2014-08-31 ENCOUNTER — Other Ambulatory Visit: Payer: Self-pay | Admitting: *Deleted

## 2014-08-31 DIAGNOSIS — I739 Peripheral vascular disease, unspecified: Secondary | ICD-10-CM

## 2014-08-31 MED ORDER — ATORVASTATIN CALCIUM 10 MG PO TABS: 10.0000 mg | ORAL_TABLET | Freq: Every day | ORAL | Status: DC

## 2014-08-31 NOTE — Progress Notes (Signed)
Arterial Duplex Lower Ext. Completed. Decari Duggar, BS, RDMS, RVT  

## 2014-09-08 ENCOUNTER — Encounter: Payer: Self-pay | Admitting: *Deleted

## 2014-10-15 ENCOUNTER — Telehealth: Payer: Self-pay | Admitting: Cardiovascular Disease

## 2014-10-15 NOTE — Telephone Encounter (Signed)
Left message for patient, will forward for dr berry's review.

## 2014-10-15 NOTE — Telephone Encounter (Signed)
Pt called in stating that she spoke with her foot doctor who wants to do surgery on her toe, and he told her that in order to do the surgery she would need to be off her plavix for 2wks. She wanted to know if this is something [name] approved of. Please f/u  Thanks

## 2014-10-30 NOTE — Telephone Encounter (Signed)
She can hold her Plavix for her procedure

## 2014-11-02 NOTE — Telephone Encounter (Signed)
Patient aware that she can hold Plavix for 2 weeks or 14 days prior to procedure.

## 2014-11-02 NOTE — Telephone Encounter (Signed)
Can this encounter be closed?

## 2014-11-03 ENCOUNTER — Other Ambulatory Visit: Payer: Self-pay

## 2014-11-03 MED ORDER — CLOPIDOGREL BISULFATE 75 MG PO TABS: 75.0000 mg | ORAL_TABLET | Freq: Once | ORAL | Status: DC

## 2014-11-03 NOTE — Telephone Encounter (Signed)
Rx(s) sent to pharmacy electronically.  

## 2014-11-16 ENCOUNTER — Encounter: Payer: Self-pay | Admitting: Podiatry

## 2014-11-16 ENCOUNTER — Ambulatory Visit (INDEPENDENT_AMBULATORY_CARE_PROVIDER_SITE_OTHER): Payer: Medicare Other | Admitting: Podiatry

## 2014-11-16 VITALS — BP 167/90 | HR 84 | Resp 12

## 2014-11-16 DIAGNOSIS — B351 Tinea unguium: Secondary | ICD-10-CM | POA: Diagnosis not present

## 2014-11-16 DIAGNOSIS — M79674 Pain in right toe(s): Secondary | ICD-10-CM | POA: Diagnosis not present

## 2014-11-16 NOTE — Progress Notes (Signed)
   Subjective:    Patient ID: Jennifer Atkinson, female    DOB: [birth year], 81 y.o.   MRN: [id number]  HPI  N-SORE, THROBBING L-RT FOOT GREAT TOENAIL D-3 YEARS C-BETTER O-SLOWLY A-PRESSURE T-PEDICURE  Review of Systems  Cardiovascular: Positive for leg swelling.  Genitourinary: Positive for frequency.  Musculoskeletal: Positive for back pain and joint swelling.  Skin: Positive for color change.  All other systems reviewed and are negative.  As patient has history of peripheral arterial disease with a history of vascular stenting by [name].She has a history of previous discomfort in the right hallux toenail. debridement.  The last visit in our office was 12/26/2011 for right hallux toenail pain. At that time she is referred to [name] for vascular evaluation.    Objective:   Physical Exam  Orientated 3  Vascular: DP pulses 0/4 bilaterally PT pulses 0/4 bilaterally Edema ankles bilaterally  Neurological: Sensation to 10 g monofilament wire intact 5/5 bilaterally Vibratory sensation nonreactive bilaterally Ankle reflexes reactive bilaterally  Dermatological: The lateral margin right hallux toenails ismildly incurvated without any erythema, edema, drainage, warmth. The nail plate has texture and color changes.  Musculoskeletal: Slow stable gait There is no restriction ankle, subtalar, midtarsal joints bilaterally        Assessment & Plan:   Assessment: Decrease pedal pulses consistent with known peripheral arterial disease Patient on anticoagulant therapy Noninfected incurvated mycotic right hallux nail  Plan: As patient has compromised circulation I recommend debridement . Skin is prepped with alcohol and the lateral margin right hallux toenail was debrided without any bleeding. An antibiotic dressing was applied  Return as needed for debridement

## 2014-11-19 DIAGNOSIS — M5090 Cervical disc disorder, unspecified, unspecified cervical region: Secondary | ICD-10-CM | POA: Diagnosis not present

## 2014-11-19 DIAGNOSIS — M7541 Impingement syndrome of right shoulder: Secondary | ICD-10-CM | POA: Diagnosis not present

## 2014-12-08 DIAGNOSIS — M25561 Pain in right knee: Secondary | ICD-10-CM | POA: Diagnosis not present

## 2014-12-08 DIAGNOSIS — M1711 Unilateral primary osteoarthritis, right knee: Secondary | ICD-10-CM | POA: Diagnosis not present

## 2014-12-11 DIAGNOSIS — M150 Primary generalized (osteo)arthritis: Secondary | ICD-10-CM | POA: Diagnosis not present

## 2014-12-11 DIAGNOSIS — F419 Anxiety disorder, unspecified: Secondary | ICD-10-CM | POA: Diagnosis not present

## 2014-12-11 DIAGNOSIS — E039 Hypothyroidism, unspecified: Secondary | ICD-10-CM | POA: Diagnosis not present

## 2014-12-11 DIAGNOSIS — I10 Essential (primary) hypertension: Secondary | ICD-10-CM | POA: Diagnosis not present

## 2014-12-11 DIAGNOSIS — K219 Gastro-esophageal reflux disease without esophagitis: Secondary | ICD-10-CM | POA: Diagnosis not present

## 2014-12-11 DIAGNOSIS — N183 Chronic kidney disease, stage 3 (moderate): Secondary | ICD-10-CM | POA: Diagnosis not present

## 2014-12-11 DIAGNOSIS — I739 Peripheral vascular disease, unspecified: Secondary | ICD-10-CM | POA: Diagnosis not present

## 2015-01-20 ENCOUNTER — Telehealth: Payer: Self-pay | Admitting: Cardiovascular Disease

## 2015-01-20 NOTE — Telephone Encounter (Signed)
Pt called in wanting to know if there is an alternative cholesterol medication she could take because she discovered that Lipitor was causing her legs to hurt. Now that she is off of the Lipitor her legs feel much better. Please advise pt on a different medication.   Thanks

## 2015-01-20 NOTE — Telephone Encounter (Signed)
Left message for patient to call.

## 2015-02-03 ENCOUNTER — Other Ambulatory Visit: Payer: Self-pay | Admitting: Cardiovascular Disease

## 2015-02-03 NOTE — Telephone Encounter (Signed)
Rx(s) sent to pharmacy electronically.  

## 2015-03-31 DIAGNOSIS — N3000 Acute cystitis without hematuria: Secondary | ICD-10-CM | POA: Diagnosis not present

## 2015-03-31 DIAGNOSIS — R410 Disorientation, unspecified: Secondary | ICD-10-CM | POA: Diagnosis not present

## 2015-04-12 DIAGNOSIS — N3000 Acute cystitis without hematuria: Secondary | ICD-10-CM | POA: Diagnosis not present

## 2015-04-13 DIAGNOSIS — R410 Disorientation, unspecified: Secondary | ICD-10-CM | POA: Diagnosis not present

## 2015-05-06 DIAGNOSIS — M7541 Impingement syndrome of right shoulder: Secondary | ICD-10-CM | POA: Diagnosis not present

## 2015-05-10 ENCOUNTER — Other Ambulatory Visit: Payer: Self-pay | Admitting: Cardiovascular Disease

## 2015-05-10 MED ORDER — CLOPIDOGREL BISULFATE 75 MG PO TABS: 75.0000 mg | ORAL_TABLET | Freq: Every day | ORAL | Status: DC

## 2015-06-14 DIAGNOSIS — K219 Gastro-esophageal reflux disease without esophagitis: Secondary | ICD-10-CM | POA: Diagnosis not present

## 2015-06-14 DIAGNOSIS — Z23 Encounter for immunization: Secondary | ICD-10-CM | POA: Diagnosis not present

## 2015-06-14 DIAGNOSIS — M150 Primary generalized (osteo)arthritis: Secondary | ICD-10-CM | POA: Diagnosis not present

## 2015-06-14 DIAGNOSIS — I739 Peripheral vascular disease, unspecified: Secondary | ICD-10-CM | POA: Diagnosis not present

## 2015-06-14 DIAGNOSIS — E039 Hypothyroidism, unspecified: Secondary | ICD-10-CM | POA: Diagnosis not present

## 2015-06-14 DIAGNOSIS — F411 Generalized anxiety disorder: Secondary | ICD-10-CM | POA: Diagnosis not present

## 2015-06-14 DIAGNOSIS — N183 Chronic kidney disease, stage 3 (moderate): Secondary | ICD-10-CM | POA: Diagnosis not present

## 2015-06-14 DIAGNOSIS — Z0000 Encounter for general adult medical examination without abnormal findings: Secondary | ICD-10-CM | POA: Diagnosis not present

## 2015-06-14 DIAGNOSIS — I10 Essential (primary) hypertension: Secondary | ICD-10-CM | POA: Diagnosis not present

## 2015-07-13 DIAGNOSIS — M1711 Unilateral primary osteoarthritis, right knee: Secondary | ICD-10-CM | POA: Diagnosis not present

## 2015-07-13 DIAGNOSIS — M25561 Pain in right knee: Secondary | ICD-10-CM | POA: Diagnosis not present

## 2015-07-20 ENCOUNTER — Observation Stay (HOSPITAL_COMMUNITY): Payer: Medicare Other

## 2015-07-20 ENCOUNTER — Emergency Department (HOSPITAL_COMMUNITY): Payer: Medicare Other

## 2015-07-20 ENCOUNTER — Encounter (HOSPITAL_COMMUNITY): Payer: Self-pay | Admitting: Emergency Medicine

## 2015-07-20 ENCOUNTER — Observation Stay (HOSPITAL_COMMUNITY)
Admission: EM | Admit: 2015-07-20 | Discharge: 2015-07-21 | Disposition: A | Discharge status: Home / Self Care | Payer: Medicare Other | Attending: Internal Medicine | Admitting: Internal Medicine

## 2015-07-20 DIAGNOSIS — R471 Dysarthria and anarthria: Secondary | ICD-10-CM | POA: Diagnosis not present

## 2015-07-20 DIAGNOSIS — Z7902 Long term (current) use of antithrombotics/antiplatelets: Secondary | ICD-10-CM | POA: Diagnosis not present

## 2015-07-20 DIAGNOSIS — K219 Gastro-esophageal reflux disease without esophagitis: Secondary | ICD-10-CM | POA: Insufficient documentation

## 2015-07-20 DIAGNOSIS — I10 Essential (primary) hypertension: Secondary | ICD-10-CM | POA: Diagnosis not present

## 2015-07-20 DIAGNOSIS — I639 Cerebral infarction, unspecified: Secondary | ICD-10-CM | POA: Diagnosis not present

## 2015-07-20 DIAGNOSIS — E039 Hypothyroidism, unspecified: Secondary | ICD-10-CM | POA: Diagnosis present

## 2015-07-20 DIAGNOSIS — Z885 Allergy status to narcotic agent status: Secondary | ICD-10-CM | POA: Insufficient documentation

## 2015-07-20 DIAGNOSIS — I739 Peripheral vascular disease, unspecified: Secondary | ICD-10-CM | POA: Diagnosis not present

## 2015-07-20 DIAGNOSIS — E785 Hyperlipidemia, unspecified: Secondary | ICD-10-CM | POA: Diagnosis present

## 2015-07-20 DIAGNOSIS — G459 Transient cerebral ischemic attack, unspecified: Secondary | ICD-10-CM | POA: Diagnosis not present

## 2015-07-20 DIAGNOSIS — R531 Weakness: Secondary | ICD-10-CM | POA: Insufficient documentation

## 2015-07-20 DIAGNOSIS — R51 Headache: Secondary | ICD-10-CM | POA: Diagnosis not present

## 2015-07-20 DIAGNOSIS — N183 Chronic kidney disease, stage 3 unspecified: Secondary | ICD-10-CM | POA: Diagnosis present

## 2015-07-20 DIAGNOSIS — Z888 Allergy status to other drugs, medicaments and biological substances status: Secondary | ICD-10-CM | POA: Insufficient documentation

## 2015-07-20 DIAGNOSIS — I129 Hypertensive chronic kidney disease with stage 1 through stage 4 chronic kidney disease, or unspecified chronic kidney disease: Secondary | ICD-10-CM | POA: Diagnosis not present

## 2015-07-20 DIAGNOSIS — Z7982 Long term (current) use of aspirin: Secondary | ICD-10-CM | POA: Diagnosis not present

## 2015-07-20 DIAGNOSIS — Z66 Do not resuscitate: Secondary | ICD-10-CM | POA: Diagnosis not present

## 2015-07-20 LAB — DIFFERENTIAL
Basophils Absolute: 0 10*3/uL (ref 0.0–0.1)
Basophils Relative: 0 %
EOS PCT: 2 %
Eosinophils Absolute: 0.2 10*3/uL (ref 0.0–0.7)
LYMPHS ABS: 3.4 10*3/uL (ref 0.7–4.0)
LYMPHS PCT: 29 %
MONO ABS: 0.5 10*3/uL (ref 0.1–1.0)
Monocytes Relative: 5 %
NEUTROS ABS: 7.6 10*3/uL (ref 1.7–7.7)
Neutrophils Relative %: 64 %

## 2015-07-20 LAB — COMPREHENSIVE METABOLIC PANEL
ALBUMIN: 3.6 g/dL (ref 3.5–5.0)
ALK PHOS: 53 U/L (ref 38–126)
ALT: 19 U/L (ref 14–54)
ANION GAP: 10 (ref 5–15)
AST: 19 U/L (ref 15–41)
BILIRUBIN TOTAL: 0.9 mg/dL (ref 0.3–1.2)
BUN: 24 mg/dL — ABNORMAL HIGH (ref 6–20)
CALCIUM: 9.4 mg/dL (ref 8.9–10.3)
CO2: 26 mmol/L (ref 22–32)
CREATININE: 1.28 mg/dL — AB (ref 0.44–1.00)
Chloride: 97 mmol/L — ABNORMAL LOW (ref 101–111)
GFR calc non Af Amer: 35 mL/min — ABNORMAL LOW (ref >60)
GFR, EST AFRICAN AMERICAN: 40 mL/min — AB (ref >60)
GLUCOSE: 123 mg/dL — AB (ref 65–99)
Potassium: 3.7 mmol/L (ref 3.5–5.1)
Sodium: 133 mmol/L — ABNORMAL LOW (ref 135–145)
TOTAL PROTEIN: 6.4 g/dL — AB (ref 6.5–8.1)

## 2015-07-20 LAB — CBC
HCT: 39.3 % (ref 36.0–46.0)
HEMOGLOBIN: 13.6 g/dL (ref 12.0–15.0)
MCH: 31.5 pg (ref 26.0–34.0)
MCHC: 34.6 g/dL (ref 30.0–36.0)
MCV: 91 fL (ref 78.0–100.0)
Platelets: 313 10*3/uL (ref 150–400)
RBC: 4.32 MIL/uL (ref 3.87–5.11)
RDW: 13.7 % (ref 11.5–15.5)
WBC: 11.8 10*3/uL — ABNORMAL HIGH (ref 4.0–10.5)

## 2015-07-20 LAB — PROTIME-INR
INR: 1 (ref 0.00–1.49)
PROTHROMBIN TIME: 13.4 s (ref 11.6–15.2)

## 2015-07-20 LAB — URINALYSIS, ROUTINE W REFLEX MICROSCOPIC
BILIRUBIN URINE: NEGATIVE
GLUCOSE, UA: NEGATIVE mg/dL
HGB URINE DIPSTICK: NEGATIVE
Ketones, ur: NEGATIVE mg/dL
Leukocytes, UA: NEGATIVE
Nitrite: NEGATIVE
Protein, ur: NEGATIVE mg/dL
SPECIFIC GRAVITY, URINE: 1.014 (ref 1.005–1.030)
pH: 6.5 (ref 5.0–8.0)

## 2015-07-20 LAB — APTT: aPTT: 24 seconds (ref 24–37)

## 2015-07-20 LAB — I-STAT TROPONIN, ED: Troponin i, poc: 0.01 ng/mL (ref 0.00–0.08)

## 2015-07-20 MED ORDER — CALCIUM CARBONATE 1500 (600 CA) MG PO TABS
600.0000 mg | ORAL_TABLET | Freq: Every day | ORAL | Status: DC
  Filled 2015-07-20 (×2): qty 1

## 2015-07-20 MED ORDER — PANTOPRAZOLE SODIUM 40 MG PO TBEC
40.0000 mg | DELAYED_RELEASE_TABLET | Freq: Every day | ORAL | Status: DC
  Administered 2015-07-20 – 2015-07-21 (×2): 40 mg via ORAL
  Filled 2015-07-20 (×2): qty 1

## 2015-07-20 MED ORDER — CLOPIDOGREL BISULFATE 75 MG PO TABS
75.0000 mg | ORAL_TABLET | Freq: Every day | ORAL | Status: DC
  Administered 2015-07-21: 75 mg via ORAL
  Filled 2015-07-20: qty 1

## 2015-07-20 MED ORDER — STROKE: EARLY STAGES OF RECOVERY BOOK
Freq: Once | Status: AC
  Administered 2015-07-20: 16:00:00

## 2015-07-20 MED ORDER — ALPRAZOLAM 0.25 MG PO TABS
0.2500 mg | ORAL_TABLET | Freq: Every evening | ORAL | Status: DC | PRN
  Administered 2015-07-20: 0.25 mg via ORAL
  Filled 2015-07-20: qty 1

## 2015-07-20 MED ORDER — METOCLOPRAMIDE HCL 5 MG/ML IJ SOLN
10.0000 mg | Freq: Once | INTRAMUSCULAR | Status: AC
  Administered 2015-07-20: 10 mg via INTRAVENOUS
  Filled 2015-07-20: qty 2

## 2015-07-20 MED ORDER — ASPIRIN 300 MG RE SUPP: 300.0000 mg | Freq: Every day | RECTAL | Status: DC

## 2015-07-20 MED ORDER — DIPHENHYDRAMINE HCL 50 MG/ML IJ SOLN
25.0000 mg | Freq: Once | INTRAMUSCULAR | Status: AC
  Administered 2015-07-20: 25 mg via INTRAVENOUS
  Filled 2015-07-20 (×2): qty 1

## 2015-07-20 MED ORDER — LEVOTHYROXINE SODIUM 50 MCG PO TABS
75.0000 ug | ORAL_TABLET | Freq: Every day | ORAL | Status: DC
  Administered 2015-07-21: 75 ug via ORAL
  Filled 2015-07-20: qty 1

## 2015-07-20 MED ORDER — ACETAMINOPHEN 500 MG PO TABS
1000.0000 mg | ORAL_TABLET | Freq: Once | ORAL | Status: AC
  Administered 2015-07-20: 1000 mg via ORAL
  Filled 2015-07-20: qty 2

## 2015-07-20 MED ORDER — HYDRALAZINE HCL 20 MG/ML IJ SOLN: 10.0000 mg | Freq: Four times a day (QID) | INTRAMUSCULAR | Status: DC | PRN

## 2015-07-20 MED ORDER — ACETAMINOPHEN 325 MG PO TABS: 650.0000 mg | ORAL_TABLET | ORAL | Status: DC | PRN

## 2015-07-20 MED ORDER — ENOXAPARIN SODIUM 30 MG/0.3ML ~~LOC~~ SOLN
30.0000 mg | SUBCUTANEOUS | Status: DC
  Administered 2015-07-20: 30 mg via SUBCUTANEOUS
  Filled 2015-07-20: qty 0.3

## 2015-07-20 MED ORDER — ASPIRIN 325 MG PO TABS
325.0000 mg | ORAL_TABLET | Freq: Every day | ORAL | Status: DC
  Administered 2015-07-21: 325 mg via ORAL
  Filled 2015-07-20: qty 1

## 2015-07-20 MED ORDER — OMEGA-3-ACID ETHYL ESTERS 1 G PO CAPS
1.0000 g | ORAL_CAPSULE | Freq: Every day | ORAL | Status: DC
  Administered 2015-07-20 – 2015-07-21 (×2): 1 g via ORAL
  Filled 2015-07-20 (×2): qty 1

## 2015-07-20 MED ORDER — SODIUM CHLORIDE 0.9 % IV SOLN
INTRAVENOUS | Status: DC
  Administered 2015-07-20: 16:00:00 via INTRAVENOUS

## 2015-07-20 NOTE — H&P (Signed)
[facility] History and Physical                                                                                    Jennifer Atkinson, is a 81 y.o. female  MRN: [id number]   DOB - Aug 15, 1920  Admit Date - 07/20/2015  Outpatient Primary MD for the patient is Donnie Coffin, MD  Referring MD: Jalene Mullet  Consulting M.D: Nicole Kindred / Neurology  With History of -  Past Medical History  Diagnosis Date  . Syncopal episodes 2012    WHILE TAKING NIACIN PT STATES FAINTING  . Chronic kidney disease     Stage 2  . Hypertension   . Hyperlipidemia   . SUI (stress urinary incontinence, female)     Recent Macroplastique  . Peripheral vascular disease ([place]) 5/13, 8/14    Rt SFA PTA with ISR   . PONV (postoperative nausea and vomiting)     "q time I had a baby I was sick when I woke up; doesn't happen anymore" (04/08/2013)  . GERD (gastroesophageal reflux disease)       Past Surgical History  Procedure Laterality Date  . Peripheral arterial stent graft Right 01/25/12; 04/08/2013    SFA; redo (04/08/2013)  . Abdominal hysterectomy  1980's  . Orif ankle fracture      right; "had 5 surgeries"  . Fracture surgery    . Appendectomy  1980's    "w/hysterectomy"  . Dilation and curettage of uterus  1980's  . Breast biopsy Right ?1980's    X 2; " benign masses"  . Peripheral vascular angiogram  01/25/2012    Moderately severe segmental right SFA 60-70% stenosis using a 6x121mm Cordis Nitinol Smart self-expanding stent, post-dilatation with a 5x56mm balloon, resulting in reduction of 60-70% stenosis to 0% residual excellent flow.  . Lower extremity arterial doppler Bilateral 12/02/2012    Right SFA stent-small amount of trickled flow which is consistent w/ greater than 90% diameter reduction with 2 vessel runoff. Left SFA demonstrated mild mixed density plaque not significant with 2 vessel runoff.  . Cardiovascular stress test  01/16/2012    Small. mild intensity mostly fixed septal defect which  likely represents artifact. Post-stress EF 76%. No significant abnormalities noted. No lexiscan EKG changes. Non-diagnostic for ischemia.  . Lower extremity angiogram N/A 01/25/2012    Procedure: LOWER EXTREMITY ANGIOGRAM;  Surgeon: [name], MD;  Location: [facility] CATH LAB;  Service: Cardiovascular;  Laterality: N/A;  . Percutaneous stent intervention  01/25/2012    Procedure: PERCUTANEOUS STENT INTERVENTION;  Surgeon: [name], MD;  Location: [facility] CATH LAB;  Service: Cardiovascular;;  . Lower extremity angiogram Right 04/08/2013    Procedure: LOWER EXTREMITY ANGIOGRAM;  Surgeon: [name], MD;  Location: [facility] CATH LAB;  Service: Cardiovascular;  Laterality: Right;  . Lower extremity angiogram Right 01/05/2014    Procedure: LOWER EXTREMITY ANGIOGRAM;  Surgeon: [name], MD;  Location: [facility] CATH LAB;  Service: Cardiovascular;  Laterality: Right;    in for   Chief Complaint  Patient presents with  . stroke like symptoms      HPI This is a 81 year old female patient with history  of stage III chronic kidney disease, peripheral vascular disease status post superficial femoral artery stenting, Hypertension, hypothyroidism and dyslipidemia. Patient was in her usual state of health until she began having aphasia/stuttering-type symptoms Sunday night. Patient reports that Sunday night she went to a social gathering that involved alcohol. She had multiple drinks containing vodka-she did not recall how many drinks she had-and while at the party patient noticed she was having difficulty getting her words out and felt she was stuttering. No other symptoms were noted by the patient such as drooping of the face, difficulty swallowing or any extremity numbness weakness or tingling. She reports that on Monday when she first awakened she felt fine but as the day progressed she developed a severe headache that improved after taking Tylenol. Later in the night her headache returned. This morning  patient was aware that she seemed to be confused noting that she wrapped a Christmas present and it took her 3 attempts to put the correct card on the present noting she had previously been utilizing a birthday card. She's not had any further dysarthria or no other focal neurological symptoms. Because of this her family discussed with the primary care physician and the patient was brought to the ER for further evaluation. In addition to the above the patient feels she may be slightly dizzy today.  In the ER patient was afebrile, somewhat hypertensive with a blood pressure of 183/71, pulse was regular at 78 respirations were 19 and room air saturations were 96%. Noncontrasted CT of the head was unremarkable. EKG revealed sinus rhythm without any ischemic changes. Laboratory data revealed sodium 133, chloride 97, BUN 24 and creatinine 1.28 with renal function being at baseline per patient, glucose 123, troponin normal, white count slightly elevated 11,800 without a left shift or elevation and absolute neutrophils. Urinalysis has been ordered but has not yet been collected.  Review of Systems   In addition to the HPI above,  No Fever-chills, myalgias or other constitutional symptoms No changes with Vision or hearing, new weakness, tingling, numbness in any extremity, No problems swallowing food or Liquids, indigestion/reflux No Chest pain, Cough or Shortness of Breath, palpitations, orthopnea or DOE No Abdominal pain, N/V; no melena or hematochezia, no dark tarry stools, Bowel movements are regular, No dysuria, hematuria or flank pain No new skin rashes, lesions, masses or bruises, No new joints pains-aches No recent weight gain or loss No polyuria, polydypsia or polyphagia,  *A full 10 point Review of Systems was done, except as stated above, all other Review of Systems were negative.  Social History Social History  Substance Use Topics  . Smoking status: Never Smoker   . Smokeless tobacco:  Never Used  . Alcohol Use: 2.4 oz/week    4 Glasses of wine per week     Comment: socially    Resides at: Private residence  Lives with: Typically lives alone but an adult daughter is currently residing with the patient due to job loss and need to have a place to live  Ambulatory status: Without assistive devices   Family History Family History  Problem Relation Age of Onset  . Diabetes Father   . Hypertension Father   . Stroke Father      Prior to Admission medications   Medication Sig Start Date End Date Taking? Authorizing Provider  ALPRAZolam (XANAX) 0.25 MG tablet Take 0.25 mg by mouth at bedtime as needed. For sleep   Yes Historical Provider, MD  aspirin 81 MG chewable tablet Chew  81 mg by mouth daily.   Yes Historical Provider, MD  calcium carbonate (OS-CAL) 600 MG TABS tablet Take 600 mg by mouth daily.   Yes Historical Provider, MD  clopidogrel (PLAVIX) 75 MG tablet Take 1 tablet (75 mg total) by mouth daily. Patient needs to contact office for additional refills 05/10/15  Yes [name], MD  hydrochlorothiazide 25 MG tablet Take 25 mg by mouth daily.     Yes Historical Provider, MD  HYDROcodone-acetaminophen (NORCO/VICODIN) 5-325 MG per tablet Take 1-2 tablets by mouth every 4 (four) hours as needed for pain. 10/24/12  Yes Jovita Gamma, MD  Lactobacillus (ACIDOPHILUS) CHEW Chew 1 tablet by mouth daily.   Yes Historical Provider, MD  levothyroxine (SYNTHROID, LEVOTHROID) 75 MCG tablet Take 75 mcg by mouth daily before breakfast.   Yes Historical Provider, MD  Misc Natural Products (OSTEO BI-FLEX ADV DOUBLE ST) TABS Take 1 tablet by mouth daily.   Yes Historical Provider, MD  Omega-3 Fatty Acids (FISH OIL) 1000 MG CAPS Take 1 capsule by mouth daily.   Yes Historical Provider, MD  omeprazole (PRILOSEC) 10 MG capsule Take 20 mg by mouth daily.    Yes Historical Provider, MD  atorvastatin (LIPITOR) 10 MG tablet Take 1 tablet (10 mg total) by mouth daily at 6 PM. 08/31/14    [name], MD    Allergies  Allergen Reactions  . Morphine Other (See Comments)    Other Reaction: GI Upset  . Niacin Other (See Comments)    Other Reaction: Other reaction  . Nsaids     Increased Creatinine  . Oxycodone-Acetaminophen Other (See Comments)    Other Reaction: Other reaction    Physical Exam  Vitals  Blood pressure 175/68, pulse 63, temperature 97.5 F (36.4 C), temperature source Oral, resp. rate 12, height 5\' 6"  (1.676 m), weight 167 lb (75.751 kg), SpO2 100 %.   General:  In no acute distress, appears healthy and well nourished and much younger than stated age  Psych:  Normal affect, Denies Suicidal or Homicidal ideations, Awake Alert, Oriented X 3. Speech and thought patterns are clear and appropriate, no apparent short term memory deficits  Neuro:   No focal neurological deficits, CN II through XII intact, Strength 5/5 all 4 extremities, Sensation intact all 4 extremities.  ENT:  Ears and Eyes appear Normal, Conjunctivae clear, PER. Moist oral mucosa without erythema or exudates.  Neck:  Supple, No lymphadenopathy appreciated  Respiratory:  Symmetrical chest wall movement, Good air movement bilaterally, CTAB. Room Air  Cardiac:  RRR, No Murmurs, no LE edema noted, no JVD, No carotid bruits, peripheral pulses palpable at 2+  Abdomen:  Positive bowel sounds, Soft, Non tender, Non distended,  No masses appreciated, no obvious hepatosplenomegaly  Skin:  No Cyanosis, Normal Skin Turgor, No Skin Rash or Bruise.  Extremities: Symmetrical without obvious trauma or injury,  no effusions.  Data Review  CBC  Recent Labs Lab 07/20/15 1015  WBC 11.8*  HGB 13.6  HCT 39.3  PLT 313  MCV 91.0  MCH 31.5  MCHC 34.6  RDW 13.7  LYMPHSABS 3.4  MONOABS 0.5  EOSABS 0.2  BASOSABS 0.0    Chemistries   Recent Labs Lab 07/20/15 1015  NA 133*  K 3.7  CL 97*  CO2 26  GLUCOSE 123*  BUN 24*  CREATININE 1.28*  CALCIUM 9.4  AST 19  ALT 19    ALKPHOS 53  BILITOT 0.9    estimated creatinine clearance is 28.6 mL/min (  by C-G formula based on Cr of 1.28).  No results for input(s): TSH, T4TOTAL, T3FREE, THYROIDAB in the last 72 hours.  Invalid input(s): FREET3  Coagulation profile  Recent Labs Lab 07/20/15 1015  INR 1.00    No results for input(s): DDIMER in the last 72 hours.  Cardiac Enzymes No results for input(s): CKMB, TROPONINI, MYOGLOBIN in the last 168 hours.  Invalid input(s): CK  Invalid input(s): POCBNP  Urinalysis    Component Value Date/Time   COLORURINE YELLOW 03/04/2011 [place] 03/04/2011 1735   LABSPEC 1.014 03/04/2011 1735   PHURINE 7.0 03/04/2011 1735   GLUCOSEU NEGATIVE 03/04/2011 1735   HGBUR NEGATIVE 03/04/2011 1735   BILIRUBINUR NEGATIVE 03/04/2011 1735   KETONESUR NEGATIVE 03/04/2011 1735   PROTEINUR NEGATIVE 03/04/2011 1735   UROBILINOGEN 0.2 03/04/2011 1735   NITRITE NEGATIVE 03/04/2011 1735   LEUKOCYTESUR SMALL* 03/04/2011 1735    Imaging results:   Ct Head Wo Contrast  07/20/2015  CLINICAL DATA:  Worst headache ever yesterday. Pain resolved last night. Headache return this morning. Confusion. EXAM: CT HEAD WITHOUT CONTRAST TECHNIQUE: Contiguous axial images were obtained from the base of the skull through the vertex without intravenous contrast. COMPARISON:  03/04/2011 FINDINGS: There is atrophy and chronic small vessel disease changes. No acute intracranial abnormality. Specifically, no hemorrhage, hydrocephalus, mass lesion, acute infarction, or significant intracranial injury. No acute calvarial abnormality. IMPRESSION: No acute intracranial abnormality. Atrophy, chronic microvascular disease. Electronically Signed   By: Rolm Baptise M.D.   On: 07/20/2015 11:46     EKG: (Independently reviewed) sinus rhythm with ventricular rate 82 bpm, QTC 462 ms, no acute ischemic changes.   Assessment & Plan  Principal Problem:   TIA (transient ischemic  attack) -Telemetry/observational status -MRI/MRA brain -Carotid duplex -Echocardiogram -2 view chest x-ray -Lipid panel and hemoglobin A1c -Increased preadmission aspirin from from 81 mg to 325 mg and continued preadmission Plavix -PT/OT evaluation -As precaution given patient's age and multiple risk factors certainly must exclude TIA event although given history of symptoms potentially related to recent unintentional overindulgence of alcoholic beverage prior to onset of symptoms (patient alcoholic beverage naive') -Urinalysis/urine culture to rule out infectious causes  Active Problems:   HTN (hypertension) -Uncontrolled while in ER -We'll hold hydrochlorothiazide in setting of mild hyponatremia as well as suspected subtle volume depletion secondary to recent alcohol usage -We'll provide when necessary hydralazine to keep systolic blood pressure greater than 160 but less than 180    PVD, Rt SFA May 2013, Aug 2014, May 2015 (ISR) -Currently no new symptomatology -Apparently has chronic claudication at baseline    CKD (chronic kidney disease) stage 3, GFR 30-59 ml/min -Renal function stable and at baseline    Dyslipidemia -In review of medication reconciliation sheet apparently no longer takes Lipitor -Follow up on lipid panel    Hypothyroid -Continue Synthroid    DVT Prophylaxis: Lovenox  Family Communication:   Multiple children at bedside  Code Status:  DO NOT RESUSCITATE  Condition:  Stable  Discharge disposition: Anticipate discharge back to preadmission home environment within the next 24-48 hours  Time spent in minutes : 60      Janie Capp L. ANP on 07/20/2015 at 12:29 PM  Between 7am to 7pm - Pager - [phone]  After 7pm go to [email or web address] - password [email or web address]  And look for the night coverage person covering me after hours  [facility]

## 2015-07-20 NOTE — Care Management Note (Signed)
Case Management Note  Patient Details  Name: Jennifer Atkinson MRN: [id number] Date of Birth: 06-Sep-1919  Subjective/Objective:                    Action/Plan: Patient was admitted for TIA work-up. Will follow for discharge needs pending PT/OT evals and physician orders. Expected Discharge Date:                  Expected Discharge Plan:     In-House Referral:     Discharge planning Services     Post Acute Care Choice:    Choice offered to:     DME Arranged:    DME Agency:     HH Arranged:    HH Agency:     Status of Service:  In process, will continue to follow  Medicare Important Message Given:    Date Medicare IM Given:    Medicare IM give by:    Date Additional Medicare IM Given:    Additional Medicare Important Message give by:     If discussed at [place] of [place] Meetings, dates discussed:    Additional Comments:  Rolm Baptise, RN 07/20/2015, 3:18 PM

## 2015-07-20 NOTE — ED Notes (Signed)
Patient transported to X-ray 

## 2015-07-20 NOTE — ED Notes (Signed)
Attempted report 

## 2015-07-20 NOTE — Consult Note (Signed)
Requesting Physician: Dr. Billy Fischer    Chief Complaint: HA and possible TIA  HPI:                                                                                                                                         Jennifer Atkinson is an 81 y.o. female who noted yesterday she had a significant HA which resolved that night. She awoke this AM and noted the HA had returned. She was writing christmas cards with her daughter and the daughter noted her though process was slower. On Sunday she was at a party and had a few glasses of wine.  She noted for about one hour she could not get her words out properly--not slurred but more of expressive aphasia. This resolved and she has had no further issues. She currently is on ASA and Plavix and has missed no doses.   Family has noted her short term memory is poor and due to this and hearing loss she lives with her daughter.   Date last known well: Date: 07/19/2015 Time last known well: Time: 22:00 tPA Given: No: out of window     Past Medical History  Diagnosis Date  . Syncopal episodes 2012    WHILE TAKING NIACIN PT STATES FAINTING  . Chronic kidney disease     Stage 2  . Hypertension   . Hyperlipidemia   . SUI (stress urinary incontinence, female)     Recent Macroplastique  . Peripheral vascular disease ([place]) 5/13, 8/14    Rt SFA PTA with ISR   . PONV (postoperative nausea and vomiting)     "q time I had a baby I was sick when I woke up; doesn't happen anymore" (04/08/2013)  . GERD (gastroesophageal reflux disease)     Past Surgical History  Procedure Laterality Date  . Peripheral arterial stent graft Right 01/25/12; 04/08/2013    SFA; redo (04/08/2013)  . Abdominal hysterectomy  1980's  . Orif ankle fracture      right; "had 5 surgeries"  . Fracture surgery    . Appendectomy  1980's    "w/hysterectomy"  . Dilation and curettage of uterus  1980's  . Breast biopsy Right ?1980's    X 2; " benign masses"  . Peripheral vascular angiogram   01/25/2012    Moderately severe segmental right SFA 60-70% stenosis using a 6x146mm Cordis Nitinol Smart self-expanding stent, post-dilatation with a 5x53mm balloon, resulting in reduction of 60-70% stenosis to 0% residual excellent flow.  . Lower extremity arterial doppler Bilateral 12/02/2012    Right SFA stent-small amount of trickled flow which is consistent w/ greater than 90% diameter reduction with 2 vessel runoff. Left SFA demonstrated mild mixed density plaque not significant with 2 vessel runoff.  . Cardiovascular stress test  01/16/2012    Small. mild intensity mostly fixed septal defect which likely represents artifact. Post-stress EF 76%. No significant  abnormalities noted. No lexiscan EKG changes. Non-diagnostic for ischemia.  . Lower extremity angiogram N/A 01/25/2012    Procedure: LOWER EXTREMITY ANGIOGRAM;  Surgeon: [name], MD;  Location: [facility] CATH LAB;  Service: Cardiovascular;  Laterality: N/A;  . Percutaneous stent intervention  01/25/2012    Procedure: PERCUTANEOUS STENT INTERVENTION;  Surgeon: [name], MD;  Location: [facility] CATH LAB;  Service: Cardiovascular;;  . Lower extremity angiogram Right 04/08/2013    Procedure: LOWER EXTREMITY ANGIOGRAM;  Surgeon: [name], MD;  Location: [facility] CATH LAB;  Service: Cardiovascular;  Laterality: Right;  . Lower extremity angiogram Right 01/05/2014    Procedure: LOWER EXTREMITY ANGIOGRAM;  Surgeon: [name], MD;  Location: [facility] CATH LAB;  Service: Cardiovascular;  Laterality: Right;    Family History  Problem Relation Age of Onset  . Diabetes Father   . Hypertension Father   . Stroke Father    Social History:  reports that she has never smoked. She has never used smokeless tobacco. She reports that she drinks about 2.4 oz of alcohol per week. She reports that she does not use illicit drugs.  Allergies:  Allergies  Allergen Reactions  . Morphine Other (See Comments)    Other Reaction: GI Upset  . Niacin Other  (See Comments)    Other Reaction: Other reaction  . Nsaids     Increased Creatinine  . Oxycodone-Acetaminophen Other (See Comments)    Other Reaction: Other reaction    Medications:                                                                                                                           No current facility-administered medications for this encounter.   Current Outpatient Prescriptions  Medication Sig Dispense Refill  . ALPRAZolam (XANAX) 0.25 MG tablet Take 0.25 mg by mouth at bedtime as needed. For sleep    . aspirin 81 MG chewable tablet Chew 81 mg by mouth daily.    . calcium carbonate (OS-CAL) 600 MG TABS tablet Take 600 mg by mouth daily.    . clopidogrel (PLAVIX) 75 MG tablet Take 1 tablet (75 mg total) by mouth daily. Patient needs to contact office for additional refills 90 tablet 2  . hydrochlorothiazide 25 MG tablet Take 25 mg by mouth daily.      Marland Kitchen HYDROcodone-acetaminophen (NORCO/VICODIN) 5-325 MG per tablet Take 1-2 tablets by mouth every 4 (four) hours as needed for pain. 50 tablet 0  . Lactobacillus (ACIDOPHILUS) CHEW Chew 1 tablet by mouth daily.    Marland Kitchen levothyroxine (SYNTHROID, LEVOTHROID) 75 MCG tablet Take 75 mcg by mouth daily before breakfast.    . Misc Natural Products (OSTEO BI-FLEX ADV DOUBLE ST) TABS Take 1 tablet by mouth daily.    . Omega-3 Fatty Acids (FISH OIL) 1000 MG CAPS Take 1 capsule by mouth daily.    Marland Kitchen omeprazole (PRILOSEC) 10 MG capsule Take 20 mg by mouth daily.     Marland Kitchen atorvastatin (LIPITOR)  10 MG tablet Take 1 tablet (10 mg total) by mouth daily at 6 PM. 30 tablet 5     ROS:                                                                                                                                       History obtained from the patient  General ROS: negative for - chills, fatigue, fever, night sweats, weight gain or weight loss Psychological ROS: negative for - behavioral disorder, hallucinations, memory difficulties, mood  swings or suicidal ideation Ophthalmic ROS: negative for - blurry vision, double vision, eye pain or loss of vision ENT ROS: negative for - epistaxis, nasal discharge, oral lesions, sore throat, tinnitus or vertigo Allergy and Immunology ROS: negative for - hives or itchy/watery eyes Hematological and Lymphatic ROS: negative for - bleeding problems, bruising or swollen lymph nodes Endocrine ROS: negative for - galactorrhea, hair pattern changes, polydipsia/polyuria or temperature intolerance Respiratory ROS: negative for - cough, hemoptysis, shortness of breath or wheezing Cardiovascular ROS: negative for - chest pain, dyspnea on exertion, edema or irregular heartbeat Gastrointestinal ROS: negative for - abdominal pain, diarrhea, hematemesis, nausea/vomiting or stool incontinence Genito-Urinary ROS: negative for - dysuria, hematuria, incontinence or urinary frequency/urgency Musculoskeletal ROS: negative for - joint swelling or muscular weakness Neurological ROS: as noted in HPI Dermatological ROS: negative for rash and skin lesion changes  Neurologic Examination:                                                                                                      Blood pressure 178/77, pulse 72, temperature 97.5 F (36.4 C), temperature source Oral, resp. rate 17, height 5\' 6"  (1.676 m), weight 75.751 kg (167 lb), SpO2 98 %.  HEENT-  Normocephalic, no lesions, without obvious abnormality.  Normal external eye and conjunctiva.  Normal TM's bilaterally.  Normal auditory canals and external ears. Normal external nose, mucus membranes and septum.  Normal pharynx. Cardiovascular- S1, S2 normal, pulses palpable throughout   Lungs- chest clear, no wheezing, rales, normal symmetric air entry Abdomen- normal findings: bowel sounds normal Extremities- no edema Lymph-no adenopathy palpable Musculoskeletal-no joint tenderness, deformity or swelling Skin-warm and dry, no hyperpigmentation, vitiligo, or  suspicious lesions  Neurological Examination Mental Status: Alert, oriented, thought content appropriate.  Speech fluent without evidence of aphasia.  Able to follow 3 step commands without difficulty. Cranial Nerves: II: Discs flat bilaterally; Visual fields grossly normal, pupils equal, round, reactive to light and accommodation III,IV, VI: ptosis  not present, extra-ocular motions intact bilaterally V,VII: smile symmetric, facial light touch sensation normal bilaterally VIII: hearing poor bilaterally--uses hearing aids IX,X: uvula rises symmetrically XI: bilateral shoulder shrug XII: midline tongue extension Motor: Right : Upper extremity   5/5    Left:     Upper extremity   5/5  Lower extremity   5/5     Lower extremity   5/5 Tone and bulk:normal tone throughout; no atrophy noted Sensory: Pinprick and light touch intact throughout, bilaterally Deep Tendon Reflexes: 1+ and symmetric throughout Plantars: Right: downgoing   Left: downgoing Cerebellar: normal finger-to-nose, and normal heel-to-shin test Gait: not tested.        Lab Results: Basic Metabolic Panel:  Recent Labs Lab 07/20/15 1015  NA 133*  K 3.7  CL 97*  CO2 26  GLUCOSE 123*  BUN 24*  CREATININE 1.28*  CALCIUM 9.4    Liver Function Tests:  Recent Labs Lab 07/20/15 1015  AST 19  ALT 19  ALKPHOS 53  BILITOT 0.9  PROT 6.4*  ALBUMIN 3.6   No results for input(s): LIPASE, AMYLASE in the last 168 hours. No results for input(s): AMMONIA in the last 168 hours.  CBC:  Recent Labs Lab 07/20/15 1015  WBC 11.8*  NEUTROABS 7.6  HGB 13.6  HCT 39.3  MCV 91.0  PLT 313    Cardiac Enzymes: No results for input(s): CKTOTAL, CKMB, CKMBINDEX, TROPONINI in the last 168 hours.  Lipid Panel: No results for input(s): CHOL, TRIG, HDL, CHOLHDL, VLDL, LDLCALC in the last 168 hours.  CBG: No results for input(s): GLUCAP in the last 168 hours.  Microbiology: Results for orders placed or performed  during the hospital encounter of 10/15/12  Surgical pcr screen     Status: None   Collection Time: 10/15/12  1:48 PM  Result Value Ref Range Status   MRSA, PCR NEGATIVE NEGATIVE Final   Staphylococcus aureus NEGATIVE NEGATIVE Final    Comment:        The Xpert SA Assay (FDA approved for NASAL specimens in patients over 34 years of age), is one component of a comprehensive surveillance program.  Test performance has been validated by [employer] for patients greater than or equal to 42 year old. It is not intended to diagnose infection nor to guide or monitor treatment.    Coagulation Studies:  Recent Labs  07/20/15 1015  LABPROT 13.4  INR 1.00    Imaging: No results found.     Etta Quill PA-C [facility] [phone]  07/20/2015, 11:18 AM   Assessment: 81 y.o. female with one hour of transient expressive aphasia. Currently resolved but complaining of HA. Exam is non-focal at this time. Given age and risk factors cannot rule out TIA or CVA with resolved symptoms.   Stroke Risk Factors - hyperlipidemia and hypertension  Recommend: 1. HgbA1c, fasting lipid panel 2. MRI, MRA  of the brain without contrast 3. PT consult, OT consult, Speech consult 4. Echocardiogram 5. Carotid dopplers 6. Prophylactic therapy-Antiplatelet med: Aspirin - dose 81 and Antiplatelet med: Plavix - dose 75 mg 7. Risk factor modification 8. Telemetry monitoring 9. Frequent neuro checks 10 NPO until passes stroke swallow screen 11 [place] [phone] starting 07/21/2015  I personally participated in this patient's evaluation and management, including formulating the above clinical impression and management recommendations.  Rush Farmer M.D. [facility] [phone]

## 2015-07-20 NOTE — ED Notes (Signed)
Admitting at bedside 

## 2015-07-20 NOTE — ED Notes (Addendum)
Per patient report, she started having "worse headache ever" yesterday afternoon.   Patient states resolved last night and went to bed at 2200.   Patient states she woke up this morning and had breakfast but the headache came back same as yesterday.   Patient daughter states that she had some confusion this morning when trying to write a Christmas card around 0800.  Patient is alert and oriented, but still having some confusion.   Denies other symptoms.  Daughter additionally added that patient was talking with her sister on Sunday and patient was trying "to say words, but jibberish came out".

## 2015-07-20 NOTE — ED Notes (Signed)
RN called xray, xray sent pt to MRI.  Floor aware.  Neuro intact on departure to xray.

## 2015-07-20 NOTE — ED Provider Notes (Signed)
CSN: [id number]     Arrival date & time 07/20/15  [id number] History   First MD Initiated Contact with Patient 07/20/15 [phone]     Chief Complaint  Patient presents with  . stroke like symptoms      (Consider location/radiation/quality/duration/timing/severity/associated sxs/prior Treatment) HPI Comments: HA, 9/10 frontal, started slowly and became worse yesterday Sunday had 1hr episode of difficulty finding words No other neurologic symptoms Had small amt of wine at the time Symptoms lasted one hour on Sunday then completely resolved Today had difficulty/confusion when writing a xmas card---began writing birthday card            Past Medical History  Diagnosis Date  . Syncopal episodes 2012    WHILE TAKING NIACIN PT STATES FAINTING  . Chronic kidney disease     Stage 2  . Hypertension   . Hyperlipidemia   . SUI (stress urinary incontinence, female)     Recent Macroplastique  . Peripheral vascular disease ([place]) 5/13, 8/14    Rt SFA PTA with ISR   . PONV (postoperative nausea and vomiting)     "q time I had a baby I was sick when I woke up; doesn't happen anymore" (04/08/2013)  . GERD (gastroesophageal reflux disease)    Past Surgical History  Procedure Laterality Date  . Peripheral arterial stent graft Right 01/25/12; 04/08/2013    SFA; redo (04/08/2013)  . Abdominal hysterectomy  1980's  . Orif ankle fracture      right; "had 5 surgeries"  . Fracture surgery    . Appendectomy  1980's    "w/hysterectomy"  . Dilation and curettage of uterus  1980's  . Breast biopsy Right ?1980's    X 2; " benign masses"  . Peripheral vascular angiogram  01/25/2012    Moderately severe segmental right SFA 60-70% stenosis using a 6x120mm Cordis Nitinol Smart self-expanding stent, post-dilatation with a 5x64mm balloon, resulting in reduction of 60-70% stenosis to 0% residual excellent flow.  . Lower extremity arterial doppler Bilateral 12/02/2012    Right SFA stent-small amount of trickled  flow which is consistent w/ greater than 90% diameter reduction with 2 vessel runoff. Left SFA demonstrated mild mixed density plaque not significant with 2 vessel runoff.  . Cardiovascular stress test  01/16/2012    Small. mild intensity mostly fixed septal defect which likely represents artifact. Post-stress EF 76%. No significant abnormalities noted. No lexiscan EKG changes. Non-diagnostic for ischemia.  . Lower extremity angiogram N/A 01/25/2012    Procedure: LOWER EXTREMITY ANGIOGRAM;  Surgeon: [name], MD;  Location: [facility] CATH LAB;  Service: Cardiovascular;  Laterality: N/A;  . Percutaneous stent intervention  01/25/2012    Procedure: PERCUTANEOUS STENT INTERVENTION;  Surgeon: [name], MD;  Location: [facility] CATH LAB;  Service: Cardiovascular;;  . Lower extremity angiogram Right 04/08/2013    Procedure: LOWER EXTREMITY ANGIOGRAM;  Surgeon: [name], MD;  Location: [facility] CATH LAB;  Service: Cardiovascular;  Laterality: Right;  . Lower extremity angiogram Right 01/05/2014    Procedure: LOWER EXTREMITY ANGIOGRAM;  Surgeon: [name], MD;  Location: [facility] CATH LAB;  Service: Cardiovascular;  Laterality: Right;   Family History  Problem Relation Age of Onset  . Diabetes Father   . Hypertension Father   . Stroke Father    Social History  Substance Use Topics  . Smoking status: Never Smoker   . Smokeless tobacco: Never Used  . Alcohol Use: 2.4 oz/week    4 Glasses of wine per week  Comment: socially   OB History    No data available     Review of Systems  Constitutional: Negative for fever.  HENT: Negative for sore throat.   Eyes: Negative for visual disturbance.  Respiratory: Negative for cough and shortness of breath.   Cardiovascular: Negative for chest pain.  Gastrointestinal: Negative for nausea, vomiting, abdominal pain and diarrhea.  Genitourinary: Negative for difficulty urinating.  Musculoskeletal: Negative for back pain and neck pain.  Skin: Negative  for rash.  Neurological: Positive for speech difficulty (episode lasted 1 hr on Sunday) and headaches. Negative for dizziness, tremors, seizures, syncope, facial asymmetry, weakness, light-headedness and numbness.      Allergies  Morphine; Niacin; Nsaids; and Oxycodone-acetaminophen  Home Medications   Prior to Admission medications   Medication Sig Start Date End Date Taking? Authorizing Provider  ALPRAZolam (XANAX) 0.25 MG tablet Take 0.25 mg by mouth at bedtime as needed. For sleep   Yes Historical Provider, MD  aspirin 81 MG chewable tablet Chew 81 mg by mouth daily.   Yes Historical Provider, MD  calcium carbonate (OS-CAL) 600 MG TABS tablet Take 600 mg by mouth daily.   Yes Historical Provider, MD  clopidogrel (PLAVIX) 75 MG tablet Take 1 tablet (75 mg total) by mouth daily. Patient needs to contact office for additional refills 05/10/15  Yes [name], MD  hydrochlorothiazide 25 MG tablet Take 25 mg by mouth daily.     Yes Historical Provider, MD  HYDROcodone-acetaminophen (NORCO/VICODIN) 5-325 MG per tablet Take 1-2 tablets by mouth every 4 (four) hours as needed for pain. 10/24/12  Yes Jovita Gamma, MD  Lactobacillus (ACIDOPHILUS) CHEW Chew 1 tablet by mouth daily.   Yes Historical Provider, MD  levothyroxine (SYNTHROID, LEVOTHROID) 75 MCG tablet Take 75 mcg by mouth daily before breakfast.   Yes Historical Provider, MD  Misc Natural Products (OSTEO BI-FLEX ADV DOUBLE ST) TABS Take 1 tablet by mouth daily.   Yes Historical Provider, MD  Omega-3 Fatty Acids (FISH OIL) 1000 MG CAPS Take 1 capsule by mouth daily.   Yes Historical Provider, MD  omeprazole (PRILOSEC) 10 MG capsule Take 20 mg by mouth daily.    Yes Historical Provider, MD  atorvastatin (LIPITOR) 10 MG tablet Take 1 tablet (10 mg total) by mouth daily at 6 PM. 08/31/14   [name], MD   BP 152/48 mmHg  Pulse 65  Temp(Src) 98.1 F (36.7 C) (Oral)  Resp 18  Ht 5\' 6"  (1.676 m)  Wt 167 lb (75.751 kg)  BMI  26.97 kg/m2  SpO2 100% Physical Exam  Constitutional: She is oriented to person, place, and time. She appears well-developed and well-nourished. No distress.  HENT:  Head: Normocephalic and atraumatic.  Eyes: Conjunctivae and EOM are normal.  Neck: Normal range of motion.  Cardiovascular: Normal rate, regular rhythm, normal heart sounds and intact distal pulses.  Exam reveals no gallop and no friction rub.   No murmur heard. Pulmonary/Chest: Effort normal and breath sounds normal. No respiratory distress. She has no wheezes. She has no rales.  Abdominal: Soft. She exhibits no distension. There is no tenderness. There is no guarding.  Musculoskeletal: She exhibits no edema or tenderness.  Neurological: She is alert and oriented to person, place, and time. She has normal strength. No cranial nerve deficit or sensory deficit. Coordination normal. GCS eye subscore is 4. GCS verbal subscore is 5. GCS motor subscore is 6.  Skin: Skin is warm and dry. No rash noted. She is not  diaphoretic. No erythema.  Nursing note and vitals reviewed.   ED Course  Procedures (including critical care time) Labs Review Labs Reviewed  CBC - Abnormal; Notable for the following:    WBC 11.8 (*)    All other components within normal limits  COMPREHENSIVE METABOLIC PANEL - Abnormal; Notable for the following:    Sodium 133 (*)    Chloride 97 (*)    Glucose, Bld 123 (*)    BUN 24 (*)    Creatinine, Ser 1.28 (*)    Total Protein 6.4 (*)    GFR calc non Af Amer 35 (*)    GFR calc Af Amer 40 (*)    All other components within normal limits  URINE CULTURE  PROTIME-INR  APTT  DIFFERENTIAL  URINALYSIS, ROUTINE W REFLEX MICROSCOPIC (NOT AT [facility])  HEMOGLOBIN A1C  LIPID PANEL  BASIC METABOLIC PANEL  I-STAT TROPOININ, ED    Imaging Review Dg Chest 2 View  07/20/2015  CLINICAL DATA:  Recent TIAs EXAM: CHEST - 2 VIEW COMPARISON:  12/30/2013 FINDINGS: The heart size and mediastinal contours are within normal  limits. Both lungs are clear. The visualized skeletal structures are unremarkable. IMPRESSION: No active disease. Electronically Signed   By: Inez Catalina M.D.   On: 07/20/2015 13:24   Ct Head Wo Contrast  07/20/2015  CLINICAL DATA:  Worst headache ever yesterday. Pain resolved last night. Headache return this morning. Confusion. EXAM: CT HEAD WITHOUT CONTRAST TECHNIQUE: Contiguous axial images were obtained from the base of the skull through the vertex without intravenous contrast. COMPARISON:  03/04/2011 FINDINGS: There is atrophy and chronic small vessel disease changes. No acute intracranial abnormality. Specifically, no hemorrhage, hydrocephalus, mass lesion, acute infarction, or significant intracranial injury. No acute calvarial abnormality. IMPRESSION: No acute intracranial abnormality. Atrophy, chronic microvascular disease. Electronically Signed   By: Rolm Baptise M.D.   On: 07/20/2015 11:46   Mr Virgel Paling Wo Contrast  07/20/2015  CLINICAL DATA:  81 year old hypertensive female with hyperlipidemia and chronic kidney disease presenting with transient dysarthria and headache. Subsequent encounter. EXAM: MRI HEAD WITHOUT CONTRAST MRA HEAD WITHOUT CONTRAST TECHNIQUE: Multiplanar, multiecho pulse sequences of the brain and surrounding structures were obtained without intravenous contrast. Angiographic images of the head were obtained using MRA technique without contrast. COMPARISON:  07/20/2015. FINDINGS: MRI HEAD FINDINGS No acute infarct. No intracranial hemorrhage. Prominent white matter type changes consistent with result of small vessel disease. Global atrophy without hydrocephalus. No intracranial mass lesion noted on this unenhanced exam. Major intracranial vascular structures are patent. Polypoid opacification lateral aspect left sphenoid sinus. Mild transverse ligament hypertrophy. Post lens replacement otherwise orbital structures unremarkable. Cervical medullary junction, pituitary region and  pineal region unremarkable. MRA HEAD FINDINGS Moderate right-sided and mild left-sided internal carotid artery supraclinoid segment narrowing. Moderate tandem stenosis M1 segment right middle cerebral artery with moderate to marked narrowing right middle cerebral artery bifurcation and branches. Mild to moderate narrowing M1 segment left middle cerebral artery. Marked narrowing left middle cerebral artery branches. Moderate to marked tandem stenosis right anterior cerebral artery with mild to moderate tandem stenosis left anterior cerebral artery. Left vertebral artery is dominant. No high-grade stenosis of the distal vertebral arteries. Mild irregularity and mild narrowing basilar artery without high-grade stenosis. Posterior inferior cerebellar artery mild to moderate narrowing and irregularity. Nonvisualized anterior inferior cerebellar arteries. Marked tandem stenosis posterior cerebral artery greater on the left. No aneurysm noted. IMPRESSION: MRI HEAD No acute infarct. Prominent white matter type changes consistent with result of  small vessel disease. Global atrophy without hydrocephalus. No intracranial mass lesion noted on this unenhanced exam. Polypoid opacification lateral aspect left sphenoid sinus. MRA HEAD Prominent intracranial atherosclerotic type changes most notable involving branch vessels as detailed above. Electronically Signed   By: Genia Del M.D.   On: 07/20/2015 14:57   [name] Contrast  07/20/2015  CLINICAL DATA:  81 year old hypertensive female with hyperlipidemia and chronic kidney disease presenting with transient dysarthria and headache. Subsequent encounter. EXAM: MRI HEAD WITHOUT CONTRAST MRA HEAD WITHOUT CONTRAST TECHNIQUE: Multiplanar, multiecho pulse sequences of the brain and surrounding structures were obtained without intravenous contrast. Angiographic images of the head were obtained using MRA technique without contrast. COMPARISON:  07/20/2015. FINDINGS: MRI HEAD  FINDINGS No acute infarct. No intracranial hemorrhage. Prominent white matter type changes consistent with result of small vessel disease. Global atrophy without hydrocephalus. No intracranial mass lesion noted on this unenhanced exam. Major intracranial vascular structures are patent. Polypoid opacification lateral aspect left sphenoid sinus. Mild transverse ligament hypertrophy. Post lens replacement otherwise orbital structures unremarkable. Cervical medullary junction, pituitary region and pineal region unremarkable. MRA HEAD FINDINGS Moderate right-sided and mild left-sided internal carotid artery supraclinoid segment narrowing. Moderate tandem stenosis M1 segment right middle cerebral artery with moderate to marked narrowing right middle cerebral artery bifurcation and branches. Mild to moderate narrowing M1 segment left middle cerebral artery. Marked narrowing left middle cerebral artery branches. Moderate to marked tandem stenosis right anterior cerebral artery with mild to moderate tandem stenosis left anterior cerebral artery. Left vertebral artery is dominant. No high-grade stenosis of the distal vertebral arteries. Mild irregularity and mild narrowing basilar artery without high-grade stenosis. Posterior inferior cerebellar artery mild to moderate narrowing and irregularity. Nonvisualized anterior inferior cerebellar arteries. Marked tandem stenosis posterior cerebral artery greater on the left. No aneurysm noted. IMPRESSION: MRI HEAD No acute infarct. Prominent white matter type changes consistent with result of small vessel disease. Global atrophy without hydrocephalus. No intracranial mass lesion noted on this unenhanced exam. Polypoid opacification lateral aspect left sphenoid sinus. MRA HEAD Prominent intracranial atherosclerotic type changes most notable involving branch vessels as detailed above. Electronically Signed   By: Genia Del M.D.   On: 07/20/2015 14:57   I have personally reviewed  and evaluated these images and lab results as part of my medical decision-making.   EKG Interpretation   Date/Time:  Tuesday July 20 2015 09:46:15 EST Ventricular Rate:  82 PR Interval:  138 QRS Duration: 97 QT Interval:  396 QTC Calculation: 462 R Axis:     Text Interpretation:  Sinus rhythm Baseline wander in lead(s) I II aVR No  significant change since last tracing Confirmed by [facility] MD, Dario Yono  ([id number]) on 07/20/2015 7:40:38 PM      MDM   Final diagnoses:  TIA (transient ischemic attack)   81 year old female with history of hypertension, hyperlipidemia, hypothyroidism, chronic kidney disease stage II, peripheral vascular disease presents with concern for headache, episode of confusion this AM, and episode of aphasia on Sunday. No current symptoms.  DDx for altered mental status includes intracranial hemorrhage, CVA, infection, electrolyte abnormality.  CT head shows no sign of intracranial abnormality. Urinalysis shows no sign of infection. Electrolytes not significantly abnormal. Given concern of episode of aphasia lasting 1 hour, neurology was consulted for concern of TIA. They recommending inpatient TIA workup and hospitalist admission. Patient was admitted to the hospitalist for further care  Gareth Morgan, MD 07/20/15 1949

## 2015-07-21 ENCOUNTER — Ambulatory Visit (HOSPITAL_BASED_OUTPATIENT_CLINIC_OR_DEPARTMENT_OTHER): Payer: Medicare Other

## 2015-07-21 ENCOUNTER — Observation Stay (HOSPITAL_BASED_OUTPATIENT_CLINIC_OR_DEPARTMENT_OTHER): Payer: Medicare Other

## 2015-07-21 DIAGNOSIS — G459 Transient cerebral ischemic attack, unspecified: Secondary | ICD-10-CM

## 2015-07-21 DIAGNOSIS — I10 Essential (primary) hypertension: Secondary | ICD-10-CM | POA: Diagnosis not present

## 2015-07-21 DIAGNOSIS — G458 Other transient cerebral ischemic attacks and related syndromes: Secondary | ICD-10-CM

## 2015-07-21 DIAGNOSIS — I639 Cerebral infarction, unspecified: Secondary | ICD-10-CM | POA: Diagnosis not present

## 2015-07-21 DIAGNOSIS — N183 Chronic kidney disease, stage 3 (moderate): Secondary | ICD-10-CM | POA: Diagnosis not present

## 2015-07-21 DIAGNOSIS — E785 Hyperlipidemia, unspecified: Secondary | ICD-10-CM | POA: Diagnosis not present

## 2015-07-21 LAB — BASIC METABOLIC PANEL
ANION GAP: 10 (ref 5–15)
BUN: 20 mg/dL (ref 6–20)
CALCIUM: 8.9 mg/dL (ref 8.9–10.3)
CO2: 25 mmol/L (ref 22–32)
Chloride: 96 mmol/L — ABNORMAL LOW (ref 101–111)
Creatinine, Ser: 1.08 mg/dL — ABNORMAL HIGH (ref 0.44–1.00)
GFR, EST AFRICAN AMERICAN: 50 mL/min — AB (ref >60)
GFR, EST NON AFRICAN AMERICAN: 43 mL/min — AB (ref >60)
Glucose, Bld: 101 mg/dL — ABNORMAL HIGH (ref 65–99)
Potassium: 3.5 mmol/L (ref 3.5–5.1)
SODIUM: 131 mmol/L — AB (ref 135–145)

## 2015-07-21 LAB — LIPID PANEL
Cholesterol: 209 mg/dL — ABNORMAL HIGH (ref 0–200)
HDL: 47 mg/dL (ref >40)
LDL CALC: 133 mg/dL — AB (ref 0–99)
Total CHOL/HDL Ratio: 4.4 RATIO
Triglycerides: 145 mg/dL (ref <150)
VLDL: 29 mg/dL (ref 0–40)

## 2015-07-21 LAB — URINE CULTURE

## 2015-07-21 MED ORDER — PRAVASTATIN SODIUM 40 MG PO TABS: 40.0000 mg | ORAL_TABLET | Freq: Every day | ORAL | Status: DC

## 2015-07-21 MED ORDER — PRAVASTATIN SODIUM 40 MG PO TABS
40.0000 mg | ORAL_TABLET | Freq: Every day | ORAL | Status: DC
  Administered 2015-07-21: 40 mg via ORAL
  Filled 2015-07-21: qty 1

## 2015-07-21 MED ORDER — CALCIUM CARBONATE 1250 (500 CA) MG PO TABS
1.0000 | ORAL_TABLET | Freq: Every day | ORAL | Status: DC
  Administered 2015-07-21: 500 mg via ORAL
  Filled 2015-07-21: qty 1

## 2015-07-21 NOTE — Progress Notes (Signed)
Preliminary results by tech - Carotid Duplex Completed. No evidence of a significant stenosis in bilateral carotid arteries. Bilateral vertebral arteries demonstrate antegrade flow. Oda Cogan, BS, RDMS, RVT

## 2015-07-21 NOTE — Evaluation (Signed)
Occupational Therapy Evaluation Patient Details Name: Jennifer Atkinson MRN: [id number] DOB: 1920-03-19 Today's Date: 07/21/2015    History of Present Illness Jennifer Atkinson is a 81 y.o. female with history of stage III chronic kidney disease, peripheral vascular disease, hypertension, hyperlipidemia who presented to the hospital with transient dysarthria and stuttering, headache.    Clinical Impression   Patient admitted with above. Patient independent to mod I PTA. Patient currently functioning at an overall mod I level.  No additional OT needs identified, D/C from acute OT services and no additional follow-up OT needs at this time. All appropriate education provided to patient and present family. Please re-order OT if needed.      Follow Up Recommendations  No OT follow up;Supervision - Intermittent    Equipment Recommendations  None recommended by OT    Recommendations for Other Services  None known at this time   Precautions / Restrictions Precautions Precautions: None Restrictions Weight Bearing Restrictions: No      Mobility Bed Mobility General bed mobility comments: Pt recieved seated in recliner upon OT entering/exiting room; see PT note  Transfers Overall transfer level: Modified independent Equipment used: None    Balance - Per PT Standardized Balance Assessment Standardized Balance Assessment : Dynamic Gait Index   Dynamic Gait Index Level Surface: Normal Change in Gait Speed: Normal Gait with Horizontal Head Turns: Moderate Impairment Gait with Vertical Head Turns: Mild Impairment Gait and Pivot Turn: Moderate Impairment Step Over Obstacle: Normal Step Around Obstacles: Normal Steps: Mild Impairment Total Score: 18      ADL Overall ADL's : Modified independent;At baseline General ADL Comments: Pt stated she has a shower seat she uses to wash her feet on, while using grab bars in shower to assist. Pt reports she has standard height toilet seats  with door knob to right and sink to left. Therapist discouraged use of door knob for transfers due to safety concerns. Expressed this concern and patient verbalized understanding. Patient also discussed using a step stool in kitchen to reach higher cabinets therapist discharged this as well.     Pertinent Vitals/Pain Pain Assessment: No/denies pain     Hand Dominance Right   Extremity/Trunk Assessment Upper Extremity Assessment Upper Extremity Assessment: Overall WFL for tasks assessed   Lower Extremity Assessment Lower Extremity Assessment: Overall WFL for tasks assessed   Cervical / Trunk Assessment Cervical / Trunk Assessment: Normal   Communication Communication Communication: No difficulties   Cognition Arousal/Alertness: Awake/alert Behavior During Therapy: WFL for tasks assessed/performed Overall Cognitive Status: Within Functional Limits for tasks assessed              Home Living Family/patient expects to be discharged to:: Private residence Living Arrangements: Alone Available Help at Discharge: Family;Available 24 hours/day Type of Home: House Home Access: Stairs to enter [employer] of Steps: 4 Entrance Stairs-Rails: Right;Left Home Layout: Multi-level;Laundry or work area in basement;Full bath on main level;Able to live on main level with bedroom/bathroom Alternate Level Stairs-Number of Steps: 14 to laundry/freezer, doesn't go upstairs Alternate Level Stairs-Rails: Right Bathroom Shower/Tub: Tub/shower unit;Door Shower/tub characteristics: Door [employer]: Standard (Pt reports door knob to left and sink to right)     Home Equipment: Walker - 2 wheels;Shower seat;Grab bars - tub/shower      Lives With: Daughter (daughter staying with her as of now)    Prior Functioning/Environment Level of Independence: Independent      OT Diagnosis: Generalized weakness   OT Problem List:  n/a, no  acute OT needs identified    OT  Treatment/Interventions:   n/a, no acute OT needs identified    OT Goals(Current goals can be found in the care plan section) Acute Rehab OT Goals Patient Stated Goal: go home this afternoon OT Goal Formulation: All assessment and education complete, DC therapy  OT Frequency:  n/a, no acute OT needs identified    Barriers to D/C:  None known at this time    End of Session Activity Tolerance: Patient tolerated treatment well Patient left: in chair;with call bell/phone within reach;with family/visitor present   Time: 1230-1240 OT Time Calculation (min): 10 min Charges:  OT General Charges $OT Visit: 1 Procedure OT Evaluation $Initial OT Evaluation Tier I: 1 Procedure G-Codes: OT G-codes **NOT FOR INPATIENT CLASS** Functional Limitation: Self care Self Care Current Status [id number]): At least 1 percent but less than 20 percent impaired, limited or restricted (mod I) Self Care Discharge Status [phone]): At least 1 percent but less than 20 percent impaired, limited or restricted (mod I)  Keir Foland , MS, OTR/L, CLT Pager: [phone] 07/21/2015, 1:08 PM

## 2015-07-21 NOTE — Progress Notes (Signed)
Discharge orders received. Pt and daughter educated on discharge instructions and stroke education. Pt verbalized understanding. IV and tele removed. Pt dressed and packed her belongings. Pt taken downstairs by staff via wheelchair.

## 2015-07-21 NOTE — Discharge Summary (Addendum)
Physician Discharge Summary  Jennifer Atkinson [id number] DOB: 1920/07/01 DOA: 07/20/2015  PCP: Donnie Coffin, MD  Admit date: 07/20/2015 Discharge date: 07/21/2015  Time spent: 35 minutes  Recommendations for Outpatient Follow-up:  1. Follow up with Neurology as an outpatient   Discharge Diagnoses:  Principal Problem:   TIA (transient ischemic attack) Active Problems:   PVD, Rt SFA May 2013, Aug 2014, May 2015 (ISR)   CKD (chronic kidney disease) stage 3, GFR 30-59 ml/min   HTN (hypertension)   Dyslipidemia   Hypothyroid   Discharge Condition: Stable  Diet recommendation: Heart healthy  Filed Weights   07/20/15 0946  Weight: 75.751 kg (167 lb)    History of present illness:  81 y.o. female with history of stage III chronic kidney disease, peripheral vascular disease, hypertension, hyperlipidemia presented with transient dysarthria and stuttering, headache today. CT head negative for acute stroke, neurology has evaluated patient, not a TPA candidate due to delayed presentation. Neurology recommended admission for stroke workup.  Hospital Course:  TIA (transient ischemic attack): A1c pending, fasting lipid panel hdl > 40, LDL > 100, cont statins. MRI, MRA of the brain no acute CVA prominent intracranial atherosclerosis. PT, OT, Speech consult for follow-up PT Echocardiogram showed no patent foreamen ovale. Carotid dopplers no ICA stenosis. Prophylactic therapy-Antiplatelet med: Aspirin 81 and plavix. Cardiac Monitoring with no events.  PVD, Rt SFA May 2013, Aug 2014, May 2015 (ISR): Continue aspirin and Plavix currently asymptomatic.  CKD (chronic kidney disease) stage 3, GFR 30-59 ml/min At baseline continue to monitor.  Essential HTN (hypertension) Hydrocodone thiazide was held on admission due to mild hyponatremia suspect it was alcohol related. Allow permissive hypertension use hydralazine for blood pressure greater than 180.   Dyslipidemia No changes  made  Hypothyroid No changes made.   Procedures:  CT head  CXR  MRI brain  ECHO  Carotid doppler.  Consultations:  NEurologist  Discharge Exam: Filed Vitals:   07/21/15 0947 07/21/15 1320  BP: 138/54 161/63  Pulse: 73 70  Temp: 99.3 F (37.4 C) 98.6 F (37 C)  Resp: 20 18    General: A&O x3 Cardiovascular: RRR Respiratory: good air movement CAT B/L  Discharge Instructions   Discharge Instructions    Diet - low sodium heart healthy    Complete by:  As directed      Increase activity slowly    Complete by:  As directed           Current Discharge Medication List    START taking these medications   Details  pravastatin (PRAVACHOL) 40 MG tablet Take 1 tablet (40 mg total) by mouth daily at 6 PM. Qty: 30 tablet, Refills: 0      CONTINUE these medications which have NOT CHANGED   Details  ALPRAZolam (XANAX) 0.25 MG tablet Take 0.25 mg by mouth at bedtime as needed. For sleep    aspirin 81 MG chewable tablet Chew 81 mg by mouth daily.    calcium carbonate (OS-CAL) 600 MG TABS tablet Take 600 mg by mouth daily.    clopidogrel (PLAVIX) 75 MG tablet Take 1 tablet (75 mg total) by mouth daily. Patient needs to contact office for additional refills Qty: 90 tablet, Refills: 2    hydrochlorothiazide 25 MG tablet Take 25 mg by mouth daily.      HYDROcodone-acetaminophen (NORCO/VICODIN) 5-325 MG per tablet Take 1-2 tablets by mouth every 4 (four) hours as needed for pain. Qty: 50 tablet, Refills: 0    Lactobacillus (ACIDOPHILUS) CHEW  Chew 1 tablet by mouth daily.    levothyroxine (SYNTHROID, LEVOTHROID) 75 MCG tablet Take 75 mcg by mouth daily before breakfast.    Misc Natural Products (OSTEO BI-FLEX ADV DOUBLE ST) TABS Take 1 tablet by mouth daily.    Omega-3 Fatty Acids (FISH OIL) 1000 MG CAPS Take 1 capsule by mouth daily.    omeprazole (PRILOSEC) 10 MG capsule Take 20 mg by mouth daily.       STOP taking these medications     atorvastatin  (LIPITOR) 10 MG tablet        Allergies  Allergen Reactions  . Lipitor [Atorvastatin]     Pain in the back of her calves  . Morphine Other (See Comments)    Other Reaction: GI Upset  . Niacin Other (See Comments)    Other Reaction: Other reaction  . Nsaids     Increased Creatinine  . Oxycodone-Acetaminophen Other (See Comments)    Other Reaction: Other reaction   Follow-up Information    Follow up with Donnie Coffin, MD In 2 weeks.   Specialty:  Family Medicine   Contact information:   [address] [place] [address] [phone]        The results of significant diagnostics from this hospitalization (including imaging, microbiology, ancillary and laboratory) are listed below for reference.    Significant Diagnostic Studies: Dg Chest 2 View  07/20/2015  CLINICAL DATA:  Recent TIAs EXAM: CHEST - 2 VIEW COMPARISON:  12/30/2013 FINDINGS: The heart size and mediastinal contours are within normal limits. Both lungs are clear. The visualized skeletal structures are unremarkable. IMPRESSION: No active disease. Electronically Signed   By: Inez Catalina M.D.   On: 07/20/2015 13:24   Ct Head Wo Contrast  07/20/2015  CLINICAL DATA:  Worst headache ever yesterday. Pain resolved last night. Headache return this morning. Confusion. EXAM: CT HEAD WITHOUT CONTRAST TECHNIQUE: Contiguous axial images were obtained from the base of the skull through the vertex without intravenous contrast. COMPARISON:  03/04/2011 FINDINGS: There is atrophy and chronic small vessel disease changes. No acute intracranial abnormality. Specifically, no hemorrhage, hydrocephalus, mass lesion, acute infarction, or significant intracranial injury. No acute calvarial abnormality. IMPRESSION: No acute intracranial abnormality. Atrophy, chronic microvascular disease. Electronically Signed   By: Rolm Baptise M.D.   On: 07/20/2015 11:46   Mr Virgel Paling Wo Contrast  07/20/2015  CLINICAL DATA:  81 year old  hypertensive female with hyperlipidemia and chronic kidney disease presenting with transient dysarthria and headache. Subsequent encounter. EXAM: MRI HEAD WITHOUT CONTRAST MRA HEAD WITHOUT CONTRAST TECHNIQUE: Multiplanar, multiecho pulse sequences of the brain and surrounding structures were obtained without intravenous contrast. Angiographic images of the head were obtained using MRA technique without contrast. COMPARISON:  07/20/2015. FINDINGS: MRI HEAD FINDINGS No acute infarct. No intracranial hemorrhage. Prominent white matter type changes consistent with result of small vessel disease. Global atrophy without hydrocephalus. No intracranial mass lesion noted on this unenhanced exam. Major intracranial vascular structures are patent. Polypoid opacification lateral aspect left sphenoid sinus. Mild transverse ligament hypertrophy. Post lens replacement otherwise orbital structures unremarkable. Cervical medullary junction, pituitary region and pineal region unremarkable. MRA HEAD FINDINGS Moderate right-sided and mild left-sided internal carotid artery supraclinoid segment narrowing. Moderate tandem stenosis M1 segment right middle cerebral artery with moderate to marked narrowing right middle cerebral artery bifurcation and branches. Mild to moderate narrowing M1 segment left middle cerebral artery. Marked narrowing left middle cerebral artery branches. Moderate to marked tandem stenosis right anterior cerebral artery with  mild to moderate tandem stenosis left anterior cerebral artery. Left vertebral artery is dominant. No high-grade stenosis of the distal vertebral arteries. Mild irregularity and mild narrowing basilar artery without high-grade stenosis. Posterior inferior cerebellar artery mild to moderate narrowing and irregularity. Nonvisualized anterior inferior cerebellar arteries. Marked tandem stenosis posterior cerebral artery greater on the left. No aneurysm noted. IMPRESSION: MRI HEAD No acute infarct.  Prominent white matter type changes consistent with result of small vessel disease. Global atrophy without hydrocephalus. No intracranial mass lesion noted on this unenhanced exam. Polypoid opacification lateral aspect left sphenoid sinus. MRA HEAD Prominent intracranial atherosclerotic type changes most notable involving branch vessels as detailed above. Electronically Signed   By: Genia Del M.D.   On: 07/20/2015 14:57   [name] Contrast  07/20/2015  CLINICAL DATA:  81 year old hypertensive female with hyperlipidemia and chronic kidney disease presenting with transient dysarthria and headache. Subsequent encounter. EXAM: MRI HEAD WITHOUT CONTRAST MRA HEAD WITHOUT CONTRAST TECHNIQUE: Multiplanar, multiecho pulse sequences of the brain and surrounding structures were obtained without intravenous contrast. Angiographic images of the head were obtained using MRA technique without contrast. COMPARISON:  07/20/2015. FINDINGS: MRI HEAD FINDINGS No acute infarct. No intracranial hemorrhage. Prominent white matter type changes consistent with result of small vessel disease. Global atrophy without hydrocephalus. No intracranial mass lesion noted on this unenhanced exam. Major intracranial vascular structures are patent. Polypoid opacification lateral aspect left sphenoid sinus. Mild transverse ligament hypertrophy. Post lens replacement otherwise orbital structures unremarkable. Cervical medullary junction, pituitary region and pineal region unremarkable. MRA HEAD FINDINGS Moderate right-sided and mild left-sided internal carotid artery supraclinoid segment narrowing. Moderate tandem stenosis M1 segment right middle cerebral artery with moderate to marked narrowing right middle cerebral artery bifurcation and branches. Mild to moderate narrowing M1 segment left middle cerebral artery. Marked narrowing left middle cerebral artery branches. Moderate to marked tandem stenosis right anterior cerebral artery with mild  to moderate tandem stenosis left anterior cerebral artery. Left vertebral artery is dominant. No high-grade stenosis of the distal vertebral arteries. Mild irregularity and mild narrowing basilar artery without high-grade stenosis. Posterior inferior cerebellar artery mild to moderate narrowing and irregularity. Nonvisualized anterior inferior cerebellar arteries. Marked tandem stenosis posterior cerebral artery greater on the left. No aneurysm noted. IMPRESSION: MRI HEAD No acute infarct. Prominent white matter type changes consistent with result of small vessel disease. Global atrophy without hydrocephalus. No intracranial mass lesion noted on this unenhanced exam. Polypoid opacification lateral aspect left sphenoid sinus. MRA HEAD Prominent intracranial atherosclerotic type changes most notable involving branch vessels as detailed above. Electronically Signed   By: Genia Del M.D.   On: 07/20/2015 14:57    Microbiology: Recent Results (from the past 240 hour(s))  Urine culture     Status: None   Collection Time: 07/20/15 12:59 PM  Result Value Ref Range Status   Specimen Description URINE, CLEAN CATCH  Final   Special Requests NONE  Final   Culture MULTIPLE SPECIES PRESENT, SUGGEST RECOLLECTION  Final   Report Status 07/21/2015 FINAL  Final     Labs: Basic Metabolic Panel:  Recent Labs Lab 07/20/15 1015 07/21/15 0441  NA 133* 131*  K 3.7 3.5  CL 97* 96*  CO2 26 25  GLUCOSE 123* 101*  BUN 24* 20  CREATININE 1.28* 1.08*  CALCIUM 9.4 8.9   Liver Function Tests:  Recent Labs Lab 07/20/15 1015  AST 19  ALT 19  ALKPHOS 53  BILITOT 0.9  PROT 6.4*  ALBUMIN 3.6  No results for input(s): LIPASE, AMYLASE in the last 168 hours. No results for input(s): AMMONIA in the last 168 hours. CBC:  Recent Labs Lab 07/20/15 1015  WBC 11.8*  NEUTROABS 7.6  HGB 13.6  HCT 39.3  MCV 91.0  PLT 313   Cardiac Enzymes: No results for input(s): CKTOTAL, CKMB, CKMBINDEX, TROPONINI in  the last 168 hours. BNP: BNP (last 3 results) No results for input(s): BNP in the last 8760 hours.  ProBNP (last 3 results) No results for input(s): PROBNP in the last 8760 hours.  CBG: No results for input(s): GLUCAP in the last 168 hours.    Signed:  Charlynne Cousins  [facility] 07/21/2015, 5:20 PM

## 2015-07-21 NOTE — Progress Notes (Signed)
  Echocardiogram 2D Echocardiogram has been performed.  Jennette Dubin 07/21/2015, 11:30 AM

## 2015-07-21 NOTE — Evaluation (Signed)
Physical Therapy Evaluation & Discharge Patient Details Name: Jennifer Atkinson MRN: [id number] DOB: Oct 02, 1919 Today's Date: 07/21/2015   History of Present Illness  Ms. Jennifer Atkinson is a 81 y.o. female with history of stage III chronic kidney disease, peripheral vascular disease, hypertension, hyperlipidemia presenting with transient dysarthria and stuttering, headache.   Clinical Impression  Patient presents close to functional baseline.  Had some difficulty with head turns with gait and turn and stop scored 18/24 on DGI (scores less than 19 demonstrate fall risk,) but feel somewhat due to immobility since admission as well as not understanding instructions initially.  Feel patient safe to d/c home with daughter available PRN and no current follow up PT recommendations.  Patient educated in fall prevention tips for home.     Follow Up Recommendations No PT follow up    Equipment Recommendations  None recommended by PT    Recommendations for Other Services       Precautions / Restrictions Precautions Precautions: None      Mobility  Bed Mobility Overal bed mobility: Modified Independent                Transfers Overall transfer level: Modified independent Equipment used: None                Ambulation/Gait Ambulation/Gait assistance: Supervision Ambulation Distance (Feet): 250 Feet Assistive device: None Gait Pattern/deviations: Step-through pattern;Decreased stride length     General Gait Details: mild imbalance noted and difficulty with turning and with turning head to left while walking, see DGI  Stairs            Wheelchair Mobility    Modified Rankin (Stroke Patients Only) Modified Rankin (Stroke Patients Only) Pre-Morbid Rankin Score: No symptoms Modified Rankin: Slight disability     Balance                                 Standardized Balance Assessment Standardized Balance Assessment : Dynamic Gait Index   Dynamic  Gait Index Level Surface: Normal Change in Gait Speed: Normal Gait with Horizontal Head Turns: Moderate Impairment Gait with Vertical Head Turns: Mild Impairment Gait and Pivot Turn: Moderate Impairment Step Over Obstacle: Normal Step Around Obstacles: Normal Steps: Mild Impairment Total Score: 18       Pertinent Vitals/Pain Pain Assessment: No/denies pain    Home Living Family/patient expects to be discharged to:: Private residence Living Arrangements: Alone Available Help at Discharge: Family;Available 24 hours/day Type of Home: House Home Access: Stairs to enter Entrance Stairs-Rails: Psychiatric nurse of Steps: 4 Home Layout: Multi-level;Laundry or work area in basement;Full bath on main level;Able to live on main level with bedroom/bathroom Home Equipment: Gilford Rile - 2 wheels;Shower seat;Grab bars - tub/shower      Prior Function Level of Independence: Independent               Hand Dominance   Dominant Hand: Right    Extremity/Trunk Assessment   Upper Extremity Assessment: Overall WFL for tasks assessed           Lower Extremity Assessment: Overall WFL for tasks assessed         Communication   Communication: No difficulties  Cognition Arousal/Alertness: Awake/alert Behavior During Therapy: WFL for tasks assessed/performed Overall Cognitive Status: Within Functional Limits for tasks assessed                      General  Comments      Exercises        Assessment/Plan    PT Assessment Patent does not need any further PT services  PT Diagnosis Abnormality of gait   PT Problem List    PT Treatment Interventions     PT Goals (Current goals can be found in the Care Plan section) Acute Rehab PT Goals PT Goal Formulation: All assessment and education complete, DC therapy    Frequency     Barriers to discharge        Co-evaluation               End of Session Equipment Utilized During Treatment: Gait  belt Activity Tolerance: Patient tolerated treatment well Patient left: in chair;with call bell/phone within reach;with family/visitor present      Functional Assessment Tool Used: Clinical Judgement Functional Limitation: Mobility: Walking and moving around Mobility: Walking and Moving Around Current Status [id number]): At least 1 percent but less than 20 percent impaired, limited or restricted Mobility: Walking and Moving Around Goal Status [phone]): At least 1 percent but less than 20 percent impaired, limited or restricted Mobility: Walking and Moving Around Discharge Status [phone]): At least 1 percent but less than 20 percent impaired, limited or restricted    Time: 1128-1158 PT Time Calculation (min) (ACUTE ONLY): 30 min   Charges:   PT Evaluation $Initial PT Evaluation Tier I: 1 Procedure PT Treatments $Gait Training: 8-22 mins   PT G Codes:   PT G-Codes **NOT FOR INPATIENT CLASS** Functional Assessment Tool Used: Clinical Judgement Functional Limitation: Mobility: Walking and moving around Mobility: Walking and Moving Around Current Status [id number]): At least 1 percent but less than 20 percent impaired, limited or restricted Mobility: Walking and Moving Around Goal Status [phone]): At least 1 percent but less than 20 percent impaired, limited or restricted Mobility: Walking and Moving Around Discharge Status [phone]): At least 1 percent but less than 20 percent impaired, limited or restricted    [facility] 07/21/2015, 12:04 PM  Jennifer Atkinson, [place] 07/21/2015

## 2015-07-21 NOTE — Evaluation (Signed)
Speech Language Pathology Evaluation Patient Details Name: Jennifer Atkinson MRN: [id number] DOB: 08/15/1920 Today's Date: 07/21/2015 Time: [id number] SLP Time Calculation (min) (ACUTE ONLY): 44 min  Problem List:  Patient Active Problem List   Diagnosis Date Noted  . TIA (transient ischemic attack) 07/20/2015  . Claudication ([place]) 01/05/2014  . Hypothyroid 04/09/2013  . PVD, Rt SFA May 2013, Aug 2014, May 2015 (ISR) 01/25/2012  . CKD (chronic kidney disease) stage 3, GFR 30-59 ml/min 01/25/2012  . HTN (hypertension) 01/25/2012  . Dyslipidemia 01/25/2012   Past Medical History:  Past Medical History  Diagnosis Date  . Syncopal episodes 2012    WHILE TAKING NIACIN PT STATES FAINTING  . Chronic kidney disease     Stage 2  . Hypertension   . Hyperlipidemia   . SUI (stress urinary incontinence, female)     Recent Macroplastique  . Peripheral vascular disease ([place]) 5/13, 8/14    Rt SFA PTA with ISR   . PONV (postoperative nausea and vomiting)     "q time I had a baby I was sick when I woke up; doesn't happen anymore" (04/08/2013)  . GERD (gastroesophageal reflux disease)    Past Surgical History:  Past Surgical History  Procedure Laterality Date  . Peripheral arterial stent graft Right 01/25/12; 04/08/2013    SFA; redo (04/08/2013)  . Abdominal hysterectomy  1980's  . Orif ankle fracture      right; "had 5 surgeries"  . Fracture surgery    . Appendectomy  1980's    "w/hysterectomy"  . Dilation and curettage of uterus  1980's  . Breast biopsy Right ?1980's    X 2; " benign masses"  . Peripheral vascular angiogram  01/25/2012    Moderately severe segmental right SFA 60-70% stenosis using a 6x140mm Cordis Nitinol Smart self-expanding stent, post-dilatation with a 5x69mm balloon, resulting in reduction of 60-70% stenosis to 0% residual excellent flow.  . Lower extremity arterial doppler Bilateral 12/02/2012    Right SFA stent-small amount of trickled flow which is consistent w/ greater  than 90% diameter reduction with 2 vessel runoff. Left SFA demonstrated mild mixed density plaque not significant with 2 vessel runoff.  . Cardiovascular stress test  01/16/2012    Small. mild intensity mostly fixed septal defect which likely represents artifact. Post-stress EF 76%. No significant abnormalities noted. No lexiscan EKG changes. Non-diagnostic for ischemia.  . Lower extremity angiogram N/A 01/25/2012    Procedure: LOWER EXTREMITY ANGIOGRAM;  Surgeon: [name], MD;  Location: [facility] CATH LAB;  Service: Cardiovascular;  Laterality: N/A;  . Percutaneous stent intervention  01/25/2012    Procedure: PERCUTANEOUS STENT INTERVENTION;  Surgeon: [name], MD;  Location: [facility] CATH LAB;  Service: Cardiovascular;;  . Lower extremity angiogram Right 04/08/2013    Procedure: LOWER EXTREMITY ANGIOGRAM;  Surgeon: [name], MD;  Location: [facility] CATH LAB;  Service: Cardiovascular;  Laterality: Right;  . Lower extremity angiogram Right 01/05/2014    Procedure: LOWER EXTREMITY ANGIOGRAM;  Surgeon: [name], MD;  Location: [facility] CATH LAB;  Service: Cardiovascular;  Laterality: Right;   HPI:  81 yo female admitted to [facility] with expressive language deficits and confusion.  Pt attributes expressive language deficits to her ETOH intake - however speech was not dysarthric but aphasic *words were not correctly stated* per daughter.  Pt PMH + for hypothyroidism, HLD, HTN, PVD.  Pt's daughter lives with her currently but family states this will be temporary.  Pt reports she is completely independent prior  to admit.     Assessment / Plan / Recommendation Clinical Impression  Pt presents with functional cognitive linguistic abilities for her current environment.  She is verbose and daughter Jennifer Atkinson reports this is baseline.   Pt able to follow multiple step commands and express herself at high level fluently.  She named 8 items beginning with letter "B" within one minute.   Pt able to write paragraph and  read multiple paragraph information demonstrating intact language *expressive/receptive.  Jennifer Atkinson and her daughter Jennifer Atkinson report she is at her baseline re: cognitive linguistic abilities.    SLP did request family check to assure pt to taking her medications as needed and managing home duties.  Reviewed findings of evaluation with pt and family *son and daughter.    SLP to sign off as alll education completed.     SLP Assessment  Patient does not need any further Speech Lanaguage Pathology Services    Follow Up Recommendations  None    Frequency and Duration   n/a        SLP Evaluation Prior Functioning  Cognitive/Linguistic Baseline: Within functional limits Type of Home: House  Lives With: Daughter (temporarily per pt) Education: HS graduate and college business  Vocation: Retired   Associate Professor  Overall Cognitive Status: Within Advertising copywriter for tasks assessed Arousal/Alertness: Awake/alert Orientation Level: Oriented X4 Attention: Sustained;Selective Sustained Attention: Appears intact Selective Attention: Appears intact Memory: Appears intact Awareness: Appears intact Safety/Judgment: Appears intact    Comprehension  Auditory Comprehension Overall Auditory Comprehension: Appears within functional limits for tasks assessed Yes/No Questions: Not tested Commands: Within Functional Limits (multiple process ) Conversation: Complex Visual Recognition/Discrimination Discrimination: Not tested Reading Comprehension Reading Status: Within funtional limits    Expression Expression Primary Mode of Expression: Verbal Verbal Expression Overall Verbal Expression: Appears within functional limits for tasks assessed Initiation: No impairment Level of Generative/Spontaneous Verbalization: Conversation (pt is verbose, daughter Jennifer Atkinson reports baseline) Repetition: No impairment Naming: Not tested Pragmatics: No impairment Written Expression Dominant Hand: Right Written  Expression: Within Functional Limits (pt reports mildly decreased penmanship over the last few months)   Oral / Motor Oral Motor/Sensory Function Overall Oral Motor/Sensory Function: Within functional limits (? mild left labial asymmetry) Motor Speech Overall Motor Speech: Appears within functional limits for tasks assessed Respiration: Within functional limits Resonance: Within functional limits Articulation: Within functional limitis Motor Planning: Witnin functional limits Motor Speech Errors: Not applicable    Luanna Salk, [place] [facility] SLP [phone]

## 2015-07-21 NOTE — Progress Notes (Signed)
[facility] PROGRESS NOTE    Progress Note   [name] [id number] DOB: April 24, 1920 DOA: 07/20/2015 PCP: Donnie Coffin, MD   Brief Narrative:   Jennifer Atkinson is an 81 y.o. female history of chronic kidney disease peripheral vascular disease they came into the hospital due to aphasia that started 3 days prior to admission. She thought it was due to all. But a progressively got worst.  Assessment/Plan:  TIA (transient ischemic attack) A1c pending, fasting lipid panel hdl > 40, LDL > 100. MRI, MRA of the Jennifer Atkinson no acute CVA prominent intracranial atherosclerosis. PT, OT, Speech consult for follow-up PT Echocardiogram pending  Carotid dopplers  Prophylactic therapy-Antiplatelet med: Aspirin 81 and plavix. Avoid D5 fluids as may be harmfull. risk factor modification . Cardiac Monitoring with no events. Neurochecks q4h  Keep MAP 70  PVD, Rt SFA May 2013, Aug 2014, May 2015 (ISR): Continue aspirin and Plavix currently asymptomatic.  CKD (chronic kidney disease) stage 3, GFR 30-59 ml/min At baseline continue to monitor.  Essential  HTN (hypertension) Hydrocodone thiazide was held on admission due to mild hyponatremia suspect it was alcohol related. Allow permissive hypertension use hydralazine for blood pressure greater than 180.    Dyslipidemia Continue statins.  Hypothyroid Cont synthroid.    DVT Prophylaxis - Lovenox ordered.  Family Communication: none Disposition Plan: Home today or tomorrow. Code Status:     Code Status Orders        Start     Ordered   07/20/15 1501  Do not attempt resuscitation (DNR)   Continuous    Question Answer Comment  In the event of cardiac or respiratory ARREST Do not call a "code blue"   In the event of cardiac or respiratory ARREST Do not perform Intubation, CPR, defibrillation or ACLS   In the event of cardiac or respiratory ARREST Use medication by any route, position, wound care, and other measures to relive pain  and suffering. May use oxygen, suction and manual treatment of airway obstruction as needed for comfort.      07/20/15 1500    Advance Directive Documentation        Most Recent Value   Type of Advance Directive  Healthcare Power of Attorney, Living will   Pre-existing out of facility DNR order (yellow form or pink MOST form)     "MOST" Form in Place?          IV Access:    Peripheral IV   Procedures and diagnostic studies:   Dg Chest 2 View  07/20/2015  CLINICAL DATA:  Recent TIAs EXAM: CHEST - 2 VIEW COMPARISON:  12/30/2013 FINDINGS: The heart size and mediastinal contours are within normal limits. Both lungs are clear. The visualized skeletal structures are unremarkable. IMPRESSION: No active disease. Electronically Signed   By: Inez Catalina M.D.   On: 07/20/2015 13:24   Ct Head Wo Contrast  07/20/2015  CLINICAL DATA:  Worst headache ever yesterday. Pain resolved last night. Headache return this morning. Confusion. EXAM: CT HEAD WITHOUT CONTRAST TECHNIQUE: Contiguous axial images were obtained from the base of the skull through the vertex without intravenous contrast. COMPARISON:  03/04/2011 FINDINGS: There is atrophy and chronic small vessel disease changes. No acute intracranial abnormality. Specifically, no hemorrhage, hydrocephalus, mass lesion, acute infarction, or significant intracranial injury. No acute calvarial abnormality. IMPRESSION: No acute intracranial abnormality. Atrophy, chronic microvascular disease. Electronically Signed   By: Rolm Baptise M.D.   On: 07/20/2015 11:46   Mr Jennifer Atkinson  Contrast  07/20/2015  CLINICAL DATA:  81 year old hypertensive female with hyperlipidemia and chronic kidney disease presenting with transient dysarthria and headache. Subsequent encounter. EXAM: MRI HEAD WITHOUT CONTRAST MRA HEAD WITHOUT CONTRAST TECHNIQUE: Multiplanar, multiecho pulse sequences of the Jennifer Atkinson and surrounding structures were obtained without intravenous contrast.  Angiographic images of the head were obtained using MRA technique without contrast. COMPARISON:  07/20/2015. FINDINGS: MRI HEAD FINDINGS No acute infarct. No intracranial hemorrhage. Prominent white matter type changes consistent with result of small vessel disease. Global atrophy without hydrocephalus. No intracranial mass lesion noted on this unenhanced exam. Major intracranial vascular structures are patent. Polypoid opacification lateral aspect left sphenoid sinus. Mild transverse ligament hypertrophy. Post lens replacement otherwise orbital structures unremarkable. Cervical medullary junction, pituitary region and pineal region unremarkable. MRA HEAD FINDINGS Moderate right-sided and mild left-sided internal carotid artery supraclinoid segment narrowing. Moderate tandem stenosis M1 segment right middle cerebral artery with moderate to marked narrowing right middle cerebral artery bifurcation and branches. Mild to moderate narrowing M1 segment left middle cerebral artery. Marked narrowing left middle cerebral artery branches. Moderate to marked tandem stenosis right anterior cerebral artery with mild to moderate tandem stenosis left anterior cerebral artery. Left vertebral artery is dominant. No high-grade stenosis of the distal vertebral arteries. Mild irregularity and mild narrowing basilar artery without high-grade stenosis. Posterior inferior cerebellar artery mild to moderate narrowing and irregularity. Nonvisualized anterior inferior cerebellar arteries. Marked tandem stenosis posterior cerebral artery greater on the left. No aneurysm noted. IMPRESSION: MRI HEAD No acute infarct. Prominent white matter type changes consistent with result of small vessel disease. Global atrophy without hydrocephalus. No intracranial mass lesion noted on this unenhanced exam. Polypoid opacification lateral aspect left sphenoid sinus. MRA HEAD Prominent intracranial atherosclerotic type changes most notable involving branch  vessels as detailed above. Electronically Signed   By: Genia Del M.D.   On: 07/20/2015 14:57   Mr Jennifer Atkinson Wo Contrast  07/20/2015  CLINICAL DATA:  81 year old hypertensive female with hyperlipidemia and chronic kidney disease presenting with transient dysarthria and headache. Subsequent encounter. EXAM: MRI HEAD WITHOUT CONTRAST MRA HEAD WITHOUT CONTRAST TECHNIQUE: Multiplanar, multiecho pulse sequences of the Jennifer Atkinson and surrounding structures were obtained without intravenous contrast. Angiographic images of the head were obtained using MRA technique without contrast. COMPARISON:  07/20/2015. FINDINGS: MRI HEAD FINDINGS No acute infarct. No intracranial hemorrhage. Prominent white matter type changes consistent with result of small vessel disease. Global atrophy without hydrocephalus. No intracranial mass lesion noted on this unenhanced exam. Major intracranial vascular structures are patent. Polypoid opacification lateral aspect left sphenoid sinus. Mild transverse ligament hypertrophy. Post lens replacement otherwise orbital structures unremarkable. Cervical medullary junction, pituitary region and pineal region unremarkable. MRA HEAD FINDINGS Moderate right-sided and mild left-sided internal carotid artery supraclinoid segment narrowing. Moderate tandem stenosis M1 segment right middle cerebral artery with moderate to marked narrowing right middle cerebral artery bifurcation and branches. Mild to moderate narrowing M1 segment left middle cerebral artery. Marked narrowing left middle cerebral artery branches. Moderate to marked tandem stenosis right anterior cerebral artery with mild to moderate tandem stenosis left anterior cerebral artery. Left vertebral artery is dominant. No high-grade stenosis of the distal vertebral arteries. Mild irregularity and mild narrowing basilar artery without high-grade stenosis. Posterior inferior cerebellar artery mild to moderate narrowing and irregularity. Nonvisualized  anterior inferior cerebellar arteries. Marked tandem stenosis posterior cerebral artery greater on the left. No aneurysm noted. IMPRESSION: MRI HEAD No acute infarct. Prominent white matter type changes consistent with result of small vessel  disease. Global atrophy without hydrocephalus. No intracranial mass lesion noted on this unenhanced exam. Polypoid opacification lateral aspect left sphenoid sinus. MRA HEAD Prominent intracranial atherosclerotic type changes most notable involving branch vessels as detailed above. Electronically Signed   By: Genia Del M.D.   On: 07/20/2015 14:57     Medical Consultants:    None.  Anti-Infectives:   Anti-infectives    None      Subjective:    Jennifer Atkinson no complaint want to home  Objective:    Filed Vitals:   07/20/15 2345 07/21/15 0145 07/21/15 0330 07/21/15 0947  BP: 131/50 136/61 120/46 138/54  Pulse: 78 77 65 73  Temp: 98.4 F (36.9 C) 98.4 F (36.9 C)  99.3 F (37.4 C)  TempSrc: Oral Oral  Oral  Resp: 18 17 15 20   Height:      Weight:      SpO2: 97% 95% 96% 96%    Intake/Output Summary (Last 24 hours) at 07/21/15 1209 Last data filed at 07/20/15 1600  Gross per 24 hour  Intake    240 ml  Output      0 ml  Net    240 ml   Filed Weights   07/20/15 0946  Weight: 75.751 kg (167 lb)    Exam: Gen:  NAD Cardiovascular:  RRR, No M/R/G Chest and lungs:   CTAB Abdomen:  Abdomen soft, NT/ND, + BS Extremities:  No C/E/C   Data Reviewed:    Labs: Basic Metabolic Panel:  Recent Labs Lab 07/20/15 1015 07/21/15 0441  NA 133* 131*  K 3.7 3.5  CL 97* 96*  CO2 26 25  GLUCOSE 123* 101*  BUN 24* 20  CREATININE 1.28* 1.08*  CALCIUM 9.4 8.9   GFR Estimated Creatinine Clearance: 33.9 mL/min (by C-G formula based on Cr of 1.08). Liver Function Tests:  Recent Labs Lab 07/20/15 1015  AST 19  ALT 19  ALKPHOS 53  BILITOT 0.9  PROT 6.4*  ALBUMIN 3.6   No results for input(s): LIPASE, AMYLASE in the last  168 hours. No results for input(s): AMMONIA in the last 168 hours. Coagulation profile  Recent Labs Lab 07/20/15 1015  INR 1.00    CBC:  Recent Labs Lab 07/20/15 1015  WBC 11.8*  NEUTROABS 7.6  HGB 13.6  HCT 39.3  MCV 91.0  PLT 313   Cardiac Enzymes: No results for input(s): CKTOTAL, CKMB, CKMBINDEX, TROPONINI in the last 168 hours. BNP (last 3 results) No results for input(s): PROBNP in the last 8760 hours. CBG: No results for input(s): GLUCAP in the last 168 hours. D-Dimer: No results for input(s): DDIMER in the last 72 hours. Hgb A1c: No results for input(s): HGBA1C in the last 72 hours. Lipid Profile:  Recent Labs  07/21/15 0441  CHOL 209*  HDL 47  LDLCALC 133*  TRIG 145  CHOLHDL 4.4   Thyroid function studies: No results for input(s): TSH, T4TOTAL, T3FREE, THYROIDAB in the last 72 hours.  Invalid input(s): FREET3 Anemia work up: No results for input(s): VITAMINB12, FOLATE, FERRITIN, TIBC, IRON, RETICCTPCT in the last 72 hours. Sepsis Labs:  Recent Labs Lab 07/20/15 1015  WBC 11.8*   Microbiology Recent Results (from the past 240 hour(s))  Urine culture     Status: None   Collection Time: 07/20/15 12:59 PM  Result Value Ref Range Status   Specimen Description URINE, CLEAN CATCH  Final   Special Requests NONE  Final   Culture MULTIPLE SPECIES PRESENT, SUGGEST RECOLLECTION  Final   Report Status 07/21/2015 FINAL  Final     Medications:   . aspirin  300 mg Rectal Daily   Or  . aspirin  325 mg Oral Daily  . calcium carbonate  1 tablet Oral Q breakfast  . clopidogrel  75 mg Oral Daily  . enoxaparin (LOVENOX) injection  30 mg Subcutaneous Q24H  . levothyroxine  75 mcg Oral QAC breakfast  . omega-3 acid ethyl esters  1 g Oral Daily  . pantoprazole  40 mg Oral Daily  . pravastatin  40 mg Oral q1800   Continuous Infusions: . sodium chloride 50 mL/hr at 07/20/15 1554    Time spent: 25 min     FELIZ Marguarite Arbour  [facility] Pager [phone]  *Please refer to [email or web address], password [email or web address] to get updated schedule on who will round on this patient, as hospitalists switch teams weekly. If 7PM-7AM, please contact night-coverage at [email or web address], password [email or web address] for any overnight needs.  07/21/2015, 12:09 PM

## 2015-07-21 NOTE — Progress Notes (Signed)
   07/21/15 0930  SLP G-Codes **NOT FOR INPATIENT CLASS**  Functional Assessment Tool Used clinical judgement  Functional Limitations Spoken language comprehension  Spoken Language Comprehension Current Status [phone]) [place]  Spoken Language Comprehension Goal Status [id number]) [place]  Spoken Language Comprehension Discharge Status [id number]) [place]  SLP Evaluations  $ SLP Speech Visit 1 Procedure  SLP Evaluations  $Self Care/Home Management 8-22  $ SLP EVAL LANGUAGE/SOUND PRODUCTION 1 Procedure  Luanna Salk, [facility] SLP [phone]

## 2015-07-21 NOTE — Progress Notes (Signed)
STROKE TEAM PROGRESS NOTE   HISTORY Jennifer Atkinson is an 81 y.o. female who noted yesterday she had a significant HA which resolved that night. She awoke this AM and noted the HA had returned. She was writing christmas cards with her daughter and the daughter noted her though process was slower. On Sunday she was at a party and had a few glasses of wine. She noted for about one hour she could not get her words out properly--not slurred but more of expressive aphasia. This resolved and she has had no further issues. She currently is on ASA and Plavix and has missed no doses. Family has noted her short term memory is poor and hearing loss. Currently, her daughter is staying with her while she Diplomatic Services operational officer) is searching for a new place. She was last known well 07/19/2015 at 22:00. Patient was not administered TPA secondary to delay in arrival. She was admitted for further evaluation and treatment.   SUBJECTIVE (INTERVAL HISTORY) Her son and preacher are at the bedside.  Overall she feels her condition is stable. She is anxious to go home. No HA today and she did not check BP when she had HA at that time. Family reported some confusion yesterday but resolved.   OBJECTIVE Temp:  [97.5 F (36.4 C)-99.3 F (37.4 C)] 99.3 F (37.4 C) (11/23 0947) Pulse Rate:  [61-80] 73 (11/23 0947) Cardiac Rhythm:  [-] Normal sinus rhythm (11/23 0700) Resp:  [12-21] 20 (11/23 0947) BP: (120-182)/(46-77) 138/54 mmHg (11/23 0947) SpO2:  [95 %-100 %] 96 % (11/23 0947)  CBC:   Recent Labs Lab 07/20/15 1015  WBC 11.8*  NEUTROABS 7.6  HGB 13.6  HCT 39.3  MCV 91.0  PLT [id number]    Basic Metabolic Panel:   Recent Labs Lab 07/20/15 1015 07/21/15 0441  NA 133* 131*  K 3.7 3.5  CL 97* 96*  CO2 26 25  GLUCOSE 123* 101*  BUN 24* 20  CREATININE 1.28* 1.08*  CALCIUM 9.4 8.9    Lipid Panel:     Component Value Date/Time   CHOL 209* 07/21/2015 0441   TRIG 145 07/21/2015 0441   HDL 47 07/21/2015 0441   CHOLHDL  4.4 07/21/2015 0441   VLDL 29 07/21/2015 0441   LDLCALC 133* 07/21/2015 0441   HgbA1c: No results found for: HGBA1C Urine Drug Screen: No results found for: LABOPIA, COCAINSCRNUR, LABBENZ, AMPHETMU, THCU, LABBARB    IMAGING  Dg Chest 2 View 07/20/2015   No active disease.   Ct Head Wo Contrast 07/20/2015   No acute intracranial abnormality. Atrophy, chronic microvascular disease.   MRI HEAD  07/20/2015  No acute infarct. Prominent white matter type changes consistent with result of small vessel disease. Global atrophy without hydrocephalus. No intracranial mass lesion noted on this unenhanced exam. Polypoid opacification lateral aspect left sphenoid sinus.   MRA HEAD  07/20/2015  Prominent intracranial atherosclerotic type changes most notable involving branch vessels   CUS - Bilateral: 1-39% ICA stenosis. Vertebral artery flow is antegrade.  2D echo - - Left ventricle: The cavity size was normal. Systolic function was normal. The estimated ejection fraction was in the range of 55% to 60%. Wall motion was normal; there were no regional wall motion abnormalities. - Atrial septum: No defect or patent foramen ovale was identified.  PHYSICAL EXAM  Temp:  [97.5 F (36.4 C)-99.3 F (37.4 C)] 98.6 F (37 C) (11/23 1320) Pulse Rate:  [65-80] 70 (11/23 1320) Resp:  [15-20] 18 (11/23 1320) BP: (120-161)/(46-63)  161/63 mmHg (11/23 1320) SpO2:  [95 %-100 %] 95 % (11/23 1320)  General - Well nourished, well developed, in no apparent distress.  Ophthalmologic - Fundi not visualized due to small pupils.  Cardiovascular - Regular rate and rhythm with no murmur.  Mental Status -  Level of arousal and orientation to time, place, and person were intact. Language including expression, naming, repetition, comprehension was assessed and found intact. Fund of Knowledge was assessed and was intact.  Cranial Nerves II - XII - II - Visual field intact OU. III, IV, VI - Extraocular  movements intact. V - Facial sensation intact bilaterally. VII - Facial movement intact bilaterally. VIII - Hearing & vestibular intact bilaterally. X - Palate elevates symmetrically. XI - Chin turning & shoulder shrug intact bilaterally. XII - Tongue protrusion intact.  Motor Strength - The patient's strength was normal in all extremities and pronator drift was absent.  Bulk was normal and fasciculations were absent.   Motor Tone - Muscle tone was assessed at the neck and appendages and was normal.  Reflexes - The patient's reflexes were 1+ in all extremities and she had no pathological reflexes.  Sensory - Light touch, temperature/pinprick were assessed and were symmetrical.    Coordination - The patient had normal movements in the hands and feet with no ataxia or dysmetria.  Tremor was absent.  Gait and Station - deferred due to safety concerns.   ASSESSMENT/PLAN Ms. Jennifer Atkinson is a 81 y.o. female with history of stage III chronic kidney disease, peripheral vascular disease, hypertension, hyperlipidemia presenting with transient dysarthria and stuttering, headache.  She did not receive IV t-PA due to delay in arrival.   Possible TIA, but also could be hypertensive event with HA and encephalopathy   Resultant  Neuro deficits resolved  MRI  No acute stroke  MRA  R MCA branch and posterior circulation vessels atherosclerotic  Carotid Doppler  unremarkable   2D Echo  unremarkable   LDL 133  HgbA1c pending  Lovenox 30 mg sq daily for VTE prophylaxis Diet heart healthy/carb modified Room service appropriate?: Yes; Fluid consistency:: Thin  aspirin 81 mg daily and clopidogrel 75 mg daily prior to admission, now on aspirin 325 mg daily and clopidogrel 75 mg daily. Recommend to continue dual antiplatelet as outpatient.  Patient counseled to be compliant with her antithrombotic medications  Ongoing aggressive stroke risk factor management  Therapy recommendations:   pending   Disposition:  pending (lives alone with her cats, her daughter is currently staying with pt temporarily while she finds a place to live)  [facility] for discharge from stroke standpoint once workup is completed if pt remains stable.  Hypertension  slightly elevated on arrival at 183/91  This am Stable at 138/54  Recommend she check her BP routinely at home   Hyperlipidemia  Home meds:  omega 3, resumed in hospital  Stopped lipitor due to pain in the back of her legs when she walks.   Will try pravachol 40 mg daily  LDL 133, goal < 70  Continue statin at discharge  Other Stroke Risk Factors  Advanced age  ETOH use  Family hx stroke (father)  PVD, hx peripheral artery stent 2013 and 2014  Other Active Problems  CKD stage III  [facility] day # 1  Neurology will sign off. Please call with questions. No neurology follow-up is needed at this time. Thanks for the consult.  Rosalin Hawking, MD PhD Stroke Neurology 07/21/2015 4:54 PM  To contact Stroke Continuity provider, please refer to [email or web address] After hours, contact General Neurology

## 2015-07-22 LAB — HEMOGLOBIN A1C
HEMOGLOBIN A1C: 5.8 % — AB (ref 4.8–5.6)
Mean Plasma Glucose: 120 mg/dL

## 2015-08-11 ENCOUNTER — Encounter: Payer: Self-pay | Admitting: Neurology

## 2015-08-11 ENCOUNTER — Ambulatory Visit (INDEPENDENT_AMBULATORY_CARE_PROVIDER_SITE_OTHER): Payer: Medicare Other | Admitting: Neurology

## 2015-08-11 VITALS — BP 155/83 | HR 76 | Ht 66.0 in | Wt 170.4 lb

## 2015-08-11 DIAGNOSIS — N183 Chronic kidney disease, stage 3 unspecified: Secondary | ICD-10-CM | POA: Insufficient documentation

## 2015-08-11 DIAGNOSIS — G451 Carotid artery syndrome (hemispheric): Secondary | ICD-10-CM | POA: Diagnosis not present

## 2015-08-11 DIAGNOSIS — I674 Hypertensive encephalopathy: Secondary | ICD-10-CM | POA: Diagnosis not present

## 2015-08-11 DIAGNOSIS — I639 Cerebral infarction, unspecified: Secondary | ICD-10-CM | POA: Diagnosis not present

## 2015-08-11 DIAGNOSIS — I739 Peripheral vascular disease, unspecified: Secondary | ICD-10-CM | POA: Diagnosis not present

## 2015-08-11 DIAGNOSIS — I10 Essential (primary) hypertension: Secondary | ICD-10-CM

## 2015-08-11 MED ORDER — AMLODIPINE BESYLATE 5 MG PO TABS: 5.0000 mg | ORAL_TABLET | Freq: Every day | ORAL | Status: DC

## 2015-08-11 NOTE — Patient Instructions (Addendum)
-   continue ASA and plavix and pravastatin for stroke prevention - will add amlodipine 5mg  daily for better BP control - check BP at home and record and bring over to DR. Berry in Jan. - Follow up with your primary care physician for stroke risk factor modification. Recommend maintain blood pressure goal <130/80, diabetes with hemoglobin A1c goal below 6.5% and lipids with LDL cholesterol goal below 70 mg/dL.  - healthy diet and regular exercise - follow up as needed.

## 2015-08-11 NOTE — Progress Notes (Signed)
STROKE NEUROLOGY FOLLOW UP NOTE  NAME: Jennifer Atkinson DOB: 1919-12-03  REASON FOR VISIT: stroke follow up HISTORY FROM: pt and chart  Today we had the pleasure of seeing Jennifer Atkinson in follow-up at our Neurology [facility]. Pt was accompanied by daughter.   History Summary Jennifer Atkinson is a 81 y.o. female with history of stage III CKD, PVD s/p stent following with [name], hypertension, hyperlipidemia was admitted on 07/20/15 for transient dysarthria and stuttering with headache. BP was high on admission. MRI no acute infarct and MRA showed right MCA and posterior circulation athero. Stroke work up with CUS and TEE unremarkable. LDL 133 and A1C 5.8. Her symptoms were thought more likely hypertensive encephalopathy and less likely TIA but for stroke prevention, she was continued on ASA and plavix and added pravastatin (not tolerance with lipitor in the past) on discharge.  Interval History During the interval time, the patient has been doing well. No recurrent stroke like symptoms. Tolerating well with pravastatin, no side effects. However, at home checking BP, running 140-170. She is on HCTZ 25mg  only at home. She has not seen PCP yet after discharge but will see [name] in 08/2015. Otherwise, no complains.  REVIEW OF SYSTEMS: Full 14 system review of systems performed and notable only for those listed below and in HPI above, all others are negative:  Constitutional:   Cardiovascular:  Ear/Nose/Throat:   Skin:  Eyes:   Respiratory:   Gastroitestinal:   Genitourinary:  Hematology/Lymphatic:   Endocrine:  Musculoskeletal:   Allergy/Immunology:   Neurological:   Psychiatric:  Sleep:   The following represents the patient's updated allergies and side effects list: Allergies  Allergen Reactions  . Lipitor [Atorvastatin]     Pain in the back of her calves  . Morphine Other (See Comments)    Other Reaction: GI Upset  . Niacin Other (See Comments)    Other Reaction: Other  reaction  . Nsaids     Increased Creatinine  . Oxycodone-Acetaminophen Other (See Comments)    Other Reaction: Other reaction    The neurologically relevant items on the patient's problem list were reviewed on today's visit.  Neurologic Examination  A problem focused neurological exam (12 or more points of the single system neurologic examination, vital signs counts as 1 point, cranial nerves count for 8 points) was performed.  Blood pressure 155/83, pulse 76, height 5\' 6"  (1.676 m), weight 170 lb 6.4 oz (77.293 kg).  General - Well nourished, well developed, in no apparent distress.  Ophthalmologic - Fundi not visualized due to eye movement.  Cardiovascular - Regular rate and rhythm with no murmur.  Mental Status -  Level of arousal and orientation to time, place, and person were intact. Language including expression, naming, repetition, comprehension was assessed and found intact. Fund of Knowledge was assessed and was intact.  Cranial Nerves II - XII - II - Visual field intact OU. III, IV, VI - Extraocular movements intact. V - Facial sensation intact bilaterally. VII - Facial movement intact bilaterally. VIII - Hearing & vestibular intact bilaterally. X - Palate elevates symmetrically. XI - Chin turning & shoulder shrug intact bilaterally. XII - Tongue protrusion intact.  Motor Strength - The patient's strength was normal in all extremities and pronator drift was absent.  Bulk was normal and fasciculations were absent.   Motor Tone - Muscle tone was assessed at the neck and appendages and was normal.  Reflexes - The patient's reflexes were 1+ in  all extremities and she had no pathological reflexes.  Sensory - Light touch, temperature/pinprick were assessed and were normal.    Coordination - The patient had normal movements in the hands and feet with no ataxia or dysmetria.  Tremor was absent.  Gait and Station - The patient's transfers, posture, gait, station, and  turns were observed as normal.  Data reviewed: I personally reviewed the images and agree with the radiology interpretations.  Ct Head Wo Contrast 07/20/2015 No acute intracranial abnormality. Atrophy, chronic microvascular disease.   MRI HEAD  07/20/2015 No acute infarct. Prominent white matter type changes consistent with result of small vessel disease. Global atrophy without hydrocephalus. No intracranial mass lesion noted on this unenhanced exam. Polypoid opacification lateral aspect left sphenoid sinus.   MRA HEAD  07/20/2015 Prominent intracranial atherosclerotic type changes most notable involving branch vessels   CUS - Bilateral: 1-39% ICA stenosis. Vertebral artery flow is antegrade.  2D echo - - Left ventricle: The cavity size was normal. Systolic function was normal. The estimated ejection fraction was in the range of 55% to 60%. Wall motion was normal; there were no regional wall motion abnormalities. - Atrial septum: No defect or patent foramen ovale was identified.  Component     Latest Ref Rng 07/21/2015  Cholesterol     0 - 200 mg/dL 209 (H)  Triglycerides     <150 mg/dL 145  HDL Cholesterol     >40 mg/dL 47  Total CHOL/HDL Ratio      4.4  VLDL     0 - 40 mg/dL 29  LDL (calc)     0 - 99 mg/dL 133 (H)  Hemoglobin A1C     4.8 - 5.6 % 5.8 (H)  Mean Plasma Glucose      120    Assessment: As you may recall, she is a 80 y.o. Caucasian female with PMH of stage III CKD, PVD s/p stent following with [name], hypertension, hyperlipidemia was admitted on 07/20/15 for transient dysarthria and stuttering with headache. BP was high on admission. MRI no acute infarct and MRA showed right MCA and posterior circulation athero. Stroke work up with CUS and TEE unremarkable. LDL 133 and A1C 5.8. Her symptoms were thought more likely hypertensive encephalopathy and less likely TIA but for stroke prevention, she was continued on ASA and plavix and added pravastatin  (not tolerance with lipitor in the past) on discharge. During the interval time, the patient has been doing well. No recurrent stroke like symptoms. Tolerating well with pravastatin, no side effects. However, BP still running at high side on HCTZ 25mg . Will add amlodipine 5mg . She has follow up in 08/2015 with [name]  Plan:  - continue ASA and plavix and pravastatin for stroke prevention - will add amlodipine 5mg  daily for better BP control - check BP at home and record and bring over to DR. Berry in 08/2015 - Follow up with your primary care physician for stroke risk factor modification. Recommend maintain blood pressure goal <130/80, diabetes with hemoglobin A1c goal below 6.5% and lipids with LDL cholesterol goal below 70 mg/dL.  - follow up as needed.  I spent more than 25 minutes of face to face time with the patient. Greater than 50% of time was spent in counseling and coordination of care. We have discussed about BP control and monitoring and the new BP meds prescribe to her today.  No orders of the defined types were placed in this encounter.    Meds  ordered this encounter  Medications  . amLODipine (NORVASC) 5 MG tablet    Sig: Take 1 tablet (5 mg total) by mouth daily.    Dispense:  30 tablet    Refill:  2    Patient Instructions  - continue ASA and plavix and pravastatin for stroke prevention - will add amlodipine 5mg  daily for better BP control - check BP at home and record and bring over to DR. Berry in Jan. - Follow up with your primary care physician for stroke risk factor modification. Recommend maintain blood pressure goal <130/80, diabetes with hemoglobin A1c goal below 6.5% and lipids with LDL cholesterol goal below 70 mg/dL.  - healthy diet and regular exercise - follow up as needed.    Rosalin Hawking, MD PhD [facility] [address] [place], [place] [address] [phone]

## 2015-08-20 ENCOUNTER — Other Ambulatory Visit: Payer: Self-pay | Admitting: *Deleted

## 2015-08-20 MED ORDER — PRAVASTATIN SODIUM 40 MG PO TABS: 40.0000 mg | ORAL_TABLET | Freq: Every day | ORAL | Status: DC

## 2015-08-27 ENCOUNTER — Other Ambulatory Visit: Payer: Self-pay | Admitting: Cardiovascular Disease

## 2015-08-27 DIAGNOSIS — I739 Peripheral vascular disease, unspecified: Secondary | ICD-10-CM

## 2015-08-31 ENCOUNTER — Ambulatory Visit (HOSPITAL_COMMUNITY)
Admission: RE | Admit: 2015-08-31 | Discharge: 2015-08-31 | Disposition: A | Discharge status: Home / Self Care | Payer: Medicare Other | Source: Ambulatory Visit | Attending: Cardiovascular Disease | Admitting: Cardiovascular Disease

## 2015-08-31 DIAGNOSIS — I129 Hypertensive chronic kidney disease with stage 1 through stage 4 chronic kidney disease, or unspecified chronic kidney disease: Secondary | ICD-10-CM | POA: Insufficient documentation

## 2015-08-31 DIAGNOSIS — R938 Abnormal findings on diagnostic imaging of other specified body structures: Secondary | ICD-10-CM | POA: Diagnosis not present

## 2015-08-31 DIAGNOSIS — E785 Hyperlipidemia, unspecified: Secondary | ICD-10-CM | POA: Diagnosis not present

## 2015-08-31 DIAGNOSIS — I739 Peripheral vascular disease, unspecified: Secondary | ICD-10-CM | POA: Insufficient documentation

## 2015-08-31 DIAGNOSIS — N182 Chronic kidney disease, stage 2 (mild): Secondary | ICD-10-CM | POA: Insufficient documentation

## 2015-09-21 ENCOUNTER — Emergency Department (HOSPITAL_COMMUNITY): Payer: Medicare Other

## 2015-09-21 ENCOUNTER — Encounter (HOSPITAL_COMMUNITY): Payer: Self-pay

## 2015-09-21 ENCOUNTER — Emergency Department (HOSPITAL_COMMUNITY)
Admission: EM | Admit: 2015-09-21 | Discharge: 2015-09-21 | Disposition: A | Discharge status: Home / Self Care | Payer: Medicare Other | Attending: Emergency Medicine | Admitting: Emergency Medicine

## 2015-09-21 DIAGNOSIS — Z8673 Personal history of transient ischemic attack (TIA), and cerebral infarction without residual deficits: Secondary | ICD-10-CM | POA: Diagnosis not present

## 2015-09-21 DIAGNOSIS — Y93H2 Activity, gardening and landscaping: Secondary | ICD-10-CM | POA: Insufficient documentation

## 2015-09-21 DIAGNOSIS — S6991XA Unspecified injury of right wrist, hand and finger(s), initial encounter: Secondary | ICD-10-CM | POA: Diagnosis not present

## 2015-09-21 DIAGNOSIS — S299XXA Unspecified injury of thorax, initial encounter: Secondary | ICD-10-CM | POA: Diagnosis not present

## 2015-09-21 DIAGNOSIS — K219 Gastro-esophageal reflux disease without esophagitis: Secondary | ICD-10-CM | POA: Insufficient documentation

## 2015-09-21 DIAGNOSIS — E785 Hyperlipidemia, unspecified: Secondary | ICD-10-CM | POA: Diagnosis not present

## 2015-09-21 DIAGNOSIS — S4991XA Unspecified injury of right shoulder and upper arm, initial encounter: Secondary | ICD-10-CM | POA: Diagnosis not present

## 2015-09-21 DIAGNOSIS — Y92007 Garden or yard of unspecified non-institutional (private) residence as the place of occurrence of the external cause: Secondary | ICD-10-CM | POA: Insufficient documentation

## 2015-09-21 DIAGNOSIS — I129 Hypertensive chronic kidney disease with stage 1 through stage 4 chronic kidney disease, or unspecified chronic kidney disease: Secondary | ICD-10-CM | POA: Diagnosis not present

## 2015-09-21 DIAGNOSIS — Y998 Other external cause status: Secondary | ICD-10-CM | POA: Insufficient documentation

## 2015-09-21 DIAGNOSIS — Z79899 Other long term (current) drug therapy: Secondary | ICD-10-CM | POA: Insufficient documentation

## 2015-09-21 DIAGNOSIS — N182 Chronic kidney disease, stage 2 (mild): Secondary | ICD-10-CM | POA: Diagnosis not present

## 2015-09-21 DIAGNOSIS — M25511 Pain in right shoulder: Secondary | ICD-10-CM | POA: Diagnosis not present

## 2015-09-21 DIAGNOSIS — S52501A Unspecified fracture of the lower end of right radius, initial encounter for closed fracture: Secondary | ICD-10-CM

## 2015-09-21 DIAGNOSIS — S40021A Contusion of right upper arm, initial encounter: Secondary | ICD-10-CM | POA: Insufficient documentation

## 2015-09-21 DIAGNOSIS — Z7902 Long term (current) use of antithrombotics/antiplatelets: Secondary | ICD-10-CM | POA: Diagnosis not present

## 2015-09-21 DIAGNOSIS — W01198A Fall on same level from slipping, tripping and stumbling with subsequent striking against other object, initial encounter: Secondary | ICD-10-CM | POA: Diagnosis not present

## 2015-09-21 DIAGNOSIS — Z7982 Long term (current) use of aspirin: Secondary | ICD-10-CM | POA: Insufficient documentation

## 2015-09-21 DIAGNOSIS — M7989 Other specified soft tissue disorders: Secondary | ICD-10-CM | POA: Diagnosis not present

## 2015-09-21 DIAGNOSIS — M79641 Pain in right hand: Secondary | ICD-10-CM | POA: Diagnosis not present

## 2015-09-21 DIAGNOSIS — S52591A Other fractures of lower end of right radius, initial encounter for closed fracture: Secondary | ICD-10-CM | POA: Insufficient documentation

## 2015-09-21 MED ORDER — FENTANYL CITRATE (PF) 100 MCG/2ML IJ SOLN
50.0000 ug | Freq: Once | INTRAMUSCULAR | Status: AC
  Administered 2015-09-21: 50 ug via INTRAVENOUS
  Filled 2015-09-21: qty 2

## 2015-09-21 NOTE — ED Notes (Signed)
Ortho tech bedside

## 2015-09-21 NOTE — Discharge Instructions (Signed)
Follow up with Dr. Amedeo Plenty at 9 Am on Thursday 1/26. Call his office today to confirm the appointment

## 2015-09-21 NOTE — ED Notes (Signed)
Daughter assisted with pt ring removal on right hand due to swelling from fall. Pt daughter has the ring

## 2015-09-21 NOTE — ED Provider Notes (Signed)
CSN: [id number]     Arrival date & time 09/21/15  [id number] History   First MD Initiated Contact with Patient 09/21/15 1111     Chief Complaint  Patient presents with  . Fall  . Shoulder Pain  . Arm Pain  . Joint Swelling     (Consider location/radiation/quality/duration/timing/severity/associated sxs/prior Treatment) HPI  82 year old female presents with right arm pain after falling yesterday. She tripped while removing weeds in her garden. She did not hit her head. She did not lose consciousness or feel dizzy before after. She hit her right shoulder and has severe right shoulder pain as well as right wrist and hand pain. Took hydrocodone with no relief. Denies any weakness or numbness. Denies chest pain or trouble breathing. Unable to move her arm due to the severity of pain.  Past Medical History  Diagnosis Date  . Syncopal episodes 2012    WHILE TAKING NIACIN PT STATES FAINTING  . Chronic kidney disease     Stage 2  . Hypertension   . Hyperlipidemia   . SUI (stress urinary incontinence, female)     Recent Macroplastique  . Peripheral vascular disease ([place]) 5/13, 8/14    Rt SFA PTA with ISR   . PONV (postoperative nausea and vomiting)     "q time I had a baby I was sick when I woke up; doesn't happen anymore" (04/08/2013)  . GERD (gastroesophageal reflux disease)   . Stroke [facility])     TIA   Past Surgical History  Procedure Laterality Date  . Peripheral arterial stent graft Right 01/25/12; 04/08/2013    SFA; redo (04/08/2013)  . Abdominal hysterectomy  1980's  . Orif ankle fracture      right; "had 5 surgeries"  . Fracture surgery    . Appendectomy  1980's    "w/hysterectomy"  . Dilation and curettage of uterus  1980's  . Breast biopsy Right ?1980's    X 2; " benign masses"  . Peripheral vascular angiogram  01/25/2012    Moderately severe segmental right SFA 60-70% stenosis using a 6x168mm Cordis Nitinol Smart self-expanding stent, post-dilatation with a 5x47mm balloon,  resulting in reduction of 60-70% stenosis to 0% residual excellent flow.  . Lower extremity arterial doppler Bilateral 12/02/2012    Right SFA stent-small amount of trickled flow which is consistent w/ greater than 90% diameter reduction with 2 vessel runoff. Left SFA demonstrated mild mixed density plaque not significant with 2 vessel runoff.  . Cardiovascular stress test  01/16/2012    Small. mild intensity mostly fixed septal defect which likely represents artifact. Post-stress EF 76%. No significant abnormalities noted. No lexiscan EKG changes. Non-diagnostic for ischemia.  . Lower extremity angiogram N/A 01/25/2012    Procedure: LOWER EXTREMITY ANGIOGRAM;  Surgeon: [name], MD;  Location[facility] C[facility] LAB;  Service: Cardiovascular;  Laterality: N/A;  . Percutaneous stent intervention  01/25/2012    Procedure: PERCUTANEOUS STENT INTERVENTION;  Surgeon: [name], MD;  Location: [facility] CATH LAB;  Service: Cardiovascular;;  . Lower extremity angiogram Right 04/08/2013    Procedure: LOWER EXTREMITY ANGIOGRAM;  Surgeon: [name], MD;  Location: [facility] CATH LAB;  Service: Cardiovascular;  Laterality: Right;  . Lower extremity angiogram Right 01/05/2014    Procedure: LOWER EXTREMITY ANGIOGRAM;  Surgeon: [name], MD;  Location: [facility] CATH LAB;  Service: Cardiovascular;  Laterality: Right;   Family History  Problem Relation Age of Onset  . Diabetes Father   . Hypertension Father   . Stroke  Father    Social History  Substance Use Topics  . Smoking status: Never Smoker   . Smokeless tobacco: Never Used  . Alcohol Use: 2.4 oz/week    4 Glasses of wine per week     Comment: socially   OB History    No data available     Review of Systems  Musculoskeletal: Positive for joint swelling and arthralgias.  Neurological: Negative for dizziness, syncope, weakness, numbness and headaches.  All other systems reviewed and are negative.     Allergies  Lipitor; Morphine; Niacin; Nsaids;  and Oxycodone-acetaminophen  Home Medications   Prior to Admission medications   Medication Sig Start Date End Date Taking? Authorizing Provider  ALPRAZolam (XANAX) 0.25 MG tablet Take 0.25 mg by mouth at bedtime as needed. For sleep    Historical Provider, MD  amLODipine (NORVASC) 5 MG tablet Take 1 tablet (5 mg total) by mouth daily. 08/11/15   Rosalin Hawking, MD  aspirin 81 MG chewable tablet Chew 81 mg by mouth daily.    Historical Provider, MD  calcium carbonate (OS-CAL) 600 MG TABS tablet Take 600 mg by mouth daily.    Historical Provider, MD  clopidogrel (PLAVIX) 75 MG tablet Take 1 tablet (75 mg total) by mouth daily. Patient needs to contact office for additional refills 05/10/15   [name], MD  hydrochlorothiazide 25 MG tablet Take 25 mg by mouth daily.      Historical Provider, MD  HYDROcodone-acetaminophen (NORCO/VICODIN) 5-325 MG per tablet Take 1-2 tablets by mouth every 4 (four) hours as needed for pain. 10/24/12   Jovita Gamma, MD  Lactobacillus (ACIDOPHILUS) CHEW Chew 1 tablet by mouth daily.    Historical Provider, MD  levothyroxine (SYNTHROID, LEVOTHROID) 75 MCG tablet Take 75 mcg by mouth daily before breakfast.    Historical Provider, MD  Misc Natural Products (OSTEO BI-FLEX ADV DOUBLE ST) TABS Take 1 tablet by mouth daily.    Historical Provider, MD  Omega-3 Fatty Acids (FISH OIL) 1000 MG CAPS Take 1 capsule by mouth daily.    Historical Provider, MD  omeprazole (PRILOSEC) 10 MG capsule Take 20 mg by mouth daily.     Historical Provider, MD  pravastatin (PRAVACHOL) 40 MG tablet Take 1 tablet (40 mg total) by mouth daily at 6 PM. 08/20/15   [name], MD   BP 142/62 mmHg  Pulse 75  Temp(Src) 97.7 F (36.5 C) (Oral)  Resp 17  SpO2 95% Physical Exam  Constitutional: She is oriented to person, place, and time. She appears well-developed and well-nourished.  HENT:  Head: Normocephalic and atraumatic.  Right Ear: External ear normal.  Left Ear: External ear  normal.  Nose: Nose normal.  Eyes: Right eye exhibits no discharge. Left eye exhibits no discharge.  Cardiovascular: Normal rate, regular rhythm and normal heart sounds.   Pulses:      Radial pulses are 2+ on the right side.  Pulmonary/Chest: Effort normal and breath sounds normal.    Abdominal: Soft. There is no tenderness.  Musculoskeletal:       Right shoulder: She exhibits decreased range of motion and tenderness.       Right elbow: No tenderness found.       Right wrist: She exhibits decreased range of motion, tenderness, bony tenderness and swelling. She exhibits no deformity.       Right upper arm: She exhibits no tenderness.       Right forearm: She exhibits no tenderness.  Right hand: She exhibits tenderness and swelling.       Hands: Neurological: She is alert and oriented to person, place, and time.  Skin: Skin is warm and dry.  Nursing note and vitals reviewed.   ED Course  Procedures (including critical care time) Labs Review Labs Reviewed - No data to display  Imaging Review Dg Ribs Unilateral W/chest Right  09/21/2015  CLINICAL DATA:  [place] at home last night, tried to catch herself with RIGHT hand, pain at RIGHT hand, wrist, arm, shoulder, initial encounter EXAM: RIGHT RIBS AND CHEST - 3+ VIEW COMPARISON:  Chest radiograph 07/20/2015 FINDINGS: Normal heart size, mediastinal contours and pulmonary vascularity. Atherosclerotic calcification aorta. Increased bibasilar atelectasis. No acute infiltrate, pleural effusion or pneumothorax. Osseous mineralization normal. No rib fracture or bone destruction. IMPRESSION: No radiographic evidence of acute rib injury. Bibasilar atelectasis. Electronically Signed   By: Lavonia Dana M.D.   On: 09/21/2015 12:06   Dg Shoulder Right  09/21/2015  CLINICAL DATA:  Pain and swelling EXAM: RIGHT SHOULDER - 2+ VIEW COMPARISON:  None. FINDINGS: Frontal and Y scapular images were obtained. There is no demonstrable fracture or dislocation.  There is mild narrowing of the acromioclavicular joint. The glenohumeral joint appears normal. There are foci of calcification superior to the acromioclavicular joint and along the inferior surface of the acromion. No erosive change. 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 Areas of calcification, likely representing calcific tendinosis. No fracture or dislocation. Electronically Signed   By: Lowella Grip III M.D.   On: 09/21/2015 11:37   Dg Wrist Complete Right  09/21/2015  CLINICAL DATA:  Fall. EXAM: RIGHT WRIST - COMPLETE 3+ VIEW COMPARISON:  09/21/2015. FINDINGS: Diffuse osteopenia and degenerative change. Subtle nondisplaced fractures of the distal radius appear to be present. Fractures appear to extend into the radiocarpal joint space. IMPRESSION: Diffuse osteopenia and degenerative change. Subtle nondisplaced fractures of the distal radius appear to be present. Fractures appear to extend into the radiocarpal joint space . Electronically Signed   By: Marcello Moores  Register   On: 09/21/2015 11:44   Dg Hand Complete Right  09/21/2015  CLINICAL DATA:  Right hand and arm pain following a fall yesterday in her yard. The patient has diffuse swelling and is unable to remove her rings. EXAM: RIGHT HAND - COMPLETE 3+ VIEW COMPARISON:  Right wrist radiographs obtained at the same time. FINDINGS: Diffuse osteopenia. There are some vague linear lucencies in the distal metaphysis and epiphysis of the radius. These are less prominent on the wrist radiographs, compatible with changes related to the patient's osteopenia. Distal radiocarpal joint spur formation is noted. There is also ligament calcification and radial compartment wrist degenerative changes, including the first metacarpal/carpal joint. Degenerative changes are also noted involving multiple interphalangeal joints and the first and second MCP joints. There is a large cyst within the lunate. IMPRESSION: 1. Diffuse osteopenia with no  definite fracture and no dislocation. 2. Chondrocalcinosis and degenerative changes. Electronically Signed   By: Claudie Revering M.D.   On: 09/21/2015 11:39   I have personally reviewed and evaluated these images and lab results as part of my medical decision-making.   EKG Interpretation None      MDM   Final diagnoses:  Fracture of right distal radius, closed, initial encounter    Patient with a mechanical fall yesterday and subsequent right arm injury. Neurovascularly intact, does appear to have a nondisplaced radius fracture. Placed in a sugar tong splint. She feels much better after 1 dose  of IV pain medicine. She is only taking 0.5 mg of her hydrocodone at home but can't take more based on no significant side effects when doing so. Will encourage her to use her hydrocodone for pain, ice and elevate her arm. Discussed with Dr. Amedeo Plenty will sees an outpatient in 2 days. Advised of return precautions.    Sherwood Gambler, MD 09/21/15 [phone]

## 2015-09-21 NOTE — ED Notes (Signed)
Patient states she tripped while weeding in flower bed. Patient c/o right shoulder pain, right arm pain, right hand and wrist pain. Patient also has swelling to the left index finger.

## 2015-09-23 DIAGNOSIS — M25511 Pain in right shoulder: Secondary | ICD-10-CM | POA: Diagnosis not present

## 2015-09-23 DIAGNOSIS — S40011A Contusion of right shoulder, initial encounter: Secondary | ICD-10-CM | POA: Diagnosis not present

## 2015-09-23 DIAGNOSIS — M25531 Pain in right wrist: Secondary | ICD-10-CM | POA: Diagnosis not present

## 2015-09-23 DIAGNOSIS — S52571A Other intraarticular fracture of lower end of right radius, initial encounter for closed fracture: Secondary | ICD-10-CM | POA: Diagnosis not present

## 2015-10-07 DIAGNOSIS — M25511 Pain in right shoulder: Secondary | ICD-10-CM | POA: Diagnosis not present

## 2015-10-07 DIAGNOSIS — S52571D Other intraarticular fracture of lower end of right radius, subsequent encounter for closed fracture with routine healing: Secondary | ICD-10-CM | POA: Diagnosis not present

## 2015-10-08 ENCOUNTER — Emergency Department (HOSPITAL_COMMUNITY): Payer: Medicare Other

## 2015-10-08 ENCOUNTER — Emergency Department (HOSPITAL_COMMUNITY)
Admission: EM | Admit: 2015-10-08 | Discharge: 2015-10-08 | Disposition: A | Discharge status: Home / Self Care | Payer: Medicare Other | Attending: Emergency Medicine | Admitting: Emergency Medicine

## 2015-10-08 ENCOUNTER — Encounter (HOSPITAL_COMMUNITY): Payer: Self-pay

## 2015-10-08 DIAGNOSIS — E785 Hyperlipidemia, unspecified: Secondary | ICD-10-CM | POA: Diagnosis not present

## 2015-10-08 DIAGNOSIS — R05 Cough: Secondary | ICD-10-CM | POA: Diagnosis not present

## 2015-10-08 DIAGNOSIS — S79911A Unspecified injury of right hip, initial encounter: Secondary | ICD-10-CM | POA: Diagnosis not present

## 2015-10-08 DIAGNOSIS — Z7982 Long term (current) use of aspirin: Secondary | ICD-10-CM | POA: Diagnosis not present

## 2015-10-08 DIAGNOSIS — Z7902 Long term (current) use of antithrombotics/antiplatelets: Secondary | ICD-10-CM | POA: Diagnosis not present

## 2015-10-08 DIAGNOSIS — Z79899 Other long term (current) drug therapy: Secondary | ICD-10-CM | POA: Diagnosis not present

## 2015-10-08 DIAGNOSIS — N182 Chronic kidney disease, stage 2 (mild): Secondary | ICD-10-CM | POA: Diagnosis not present

## 2015-10-08 DIAGNOSIS — K219 Gastro-esophageal reflux disease without esophagitis: Secondary | ICD-10-CM | POA: Diagnosis not present

## 2015-10-08 DIAGNOSIS — I129 Hypertensive chronic kidney disease with stage 1 through stage 4 chronic kidney disease, or unspecified chronic kidney disease: Secondary | ICD-10-CM | POA: Insufficient documentation

## 2015-10-08 DIAGNOSIS — M79604 Pain in right leg: Secondary | ICD-10-CM | POA: Diagnosis not present

## 2015-10-08 DIAGNOSIS — I10 Essential (primary) hypertension: Secondary | ICD-10-CM | POA: Diagnosis not present

## 2015-10-08 DIAGNOSIS — Z8673 Personal history of transient ischemic attack (TIA), and cerebral infarction without residual deficits: Secondary | ICD-10-CM | POA: Diagnosis not present

## 2015-10-08 DIAGNOSIS — R52 Pain, unspecified: Secondary | ICD-10-CM

## 2015-10-08 DIAGNOSIS — M25551 Pain in right hip: Secondary | ICD-10-CM | POA: Diagnosis not present

## 2015-10-08 MED ORDER — HYDROCODONE-ACETAMINOPHEN 5-325 MG PO TABS: 1.0000 | ORAL_TABLET | Freq: Once | ORAL | Status: DC

## 2015-10-08 MED ORDER — FENTANYL CITRATE (PF) 100 MCG/2ML IJ SOLN
50.0000 ug | Freq: Once | INTRAMUSCULAR | Status: AC
  Administered 2015-10-08: 50 ug via INTRAVENOUS
  Filled 2015-10-08: qty 2

## 2015-10-08 MED ORDER — FENTANYL CITRATE (PF) 100 MCG/2ML IJ SOLN: 50.0000 ug | Freq: Once | INTRAMUSCULAR | Status: DC

## 2015-10-08 MED ORDER — ONDANSETRON HCL 4 MG/2ML IJ SOLN
4.0000 mg | Freq: Once | INTRAMUSCULAR | Status: AC
  Administered 2015-10-08: 4 mg via INTRAVENOUS
  Filled 2015-10-08: qty 2

## 2015-10-08 NOTE — ED Notes (Signed)
Pt arrives EMS with c/o pain at right hip. Pt denies truama.

## 2015-10-08 NOTE — ED Provider Notes (Signed)
CSN: [id number]     Arrival date & time 10/08/15  0714 History   First MD Initiated Contact with Patient 10/08/15 [phone]     Chief Complaint  Patient presents with  . Hip Pain     (Consider location/radiation/quality/duration/timing/severity/associated sxs/prior Treatment) HPI Comments: Patient is a 82 year old female with history of peripheral vascular disease status post stenting by [name] in the superficial femoral artery on the right approximately 2 years ago who had a mechanical fall 3 weeks ago resulting in right-sided wrist fracture and rotator cuff injury who presents today with worsening right hip pain over the last 2 days. Patient states she went to the orthopedist yesterday and had a cortisone shot in her shoulder and after getting home started noticing some pain in her right hip. Family states that the pain seemed to worsen last night and is much worse with ambulation. This morning patient states the pain was severe in the right hip despite taking Vicodin at home. She is having a difficult time ambulating due to severe pain that she had to call the ambulance. She denies fever, change in color or sensation in the right foot. She did have an MRI done of her stent and she has a follow-up with [name] in 2 weeks. She denies a fall or any trauma that would precipitate the pain. She denies any rashes or swelling to that area.  Patient is a 82 y.o. female presenting with hip pain. The history is provided by the patient.  Hip Pain This is a new problem. The current episode started 2 days ago. The problem occurs constantly. The problem has been gradually worsening. The symptoms are aggravated by walking. Nothing relieves the symptoms.    Past Medical History  Diagnosis Date  . Syncopal episodes 2012    WHILE TAKING NIACIN PT STATES FAINTING  . Chronic kidney disease     Stage 2  . Hypertension   . Hyperlipidemia   . SUI (stress urinary incontinence, female)     Recent Macroplastique    . Peripheral vascular disease ([place]) 5/13, 8/14    Rt SFA PTA with ISR   . PONV (postoperative nausea and vomiting)     "q time I had a baby I was sick when I woke up; doesn't happen anymore" (04/08/2013)  . GERD (gastroesophageal reflux disease)   . Stroke [facility])     TIA   Past Surgical History  Procedure Laterality Date  . Peripheral arterial stent graft Right 01/25/12; 04/08/2013    SFA; redo (04/08/2013)  . Abdominal hysterectomy  1980's  . Orif ankle fracture      right; "had 5 surgeries"  . Fracture surgery    . Appendectomy  1980's    "w/hysterectomy"  . Dilation and curettage of uterus  1980's  . Breast biopsy Right ?1980's    X 2; " benign masses"  . Peripheral vascular angiogram  01/25/2012    Moderately severe segmental right SFA 60-70% stenosis using a 6x161mm Cordis Nitinol Smart self-expanding stent, post-dilatation with a 5x81mm balloon, resulting in reduction of 60-70% stenosis to 0% residual excellent flow.  . Lower extremity arterial doppler Bilateral 12/02/2012    Right SFA stent-small amount of trickled flow which is consistent w/ greater than 90% diameter reduction with 2 vessel runoff. Left SFA demonstrated mild mixed density plaque not significant with 2 vessel runoff.  . Cardiovascular stress test  01/16/2012    Small. mild intensity mostly fixed septal defect which likely represents artifact. Post-stress EF  76%. No significant abnormalities noted. No lexiscan EKG changes. Non-diagnostic for ischemia.  . Lower extremity angiogram N/A 01/25/2012    Procedure: LOWER EXTREMITY ANGIOGRAM;  Surgeon: [name], MD;  Location: [facility] CATH LAB;  Service: Cardiovascular;  Laterality: N/A;  . Percutaneous stent intervention  01/25/2012    Procedure: PERCUTANEOUS STENT INTERVENTION;  Surgeon: [name], MD;  Location: [facility] CATH LAB;  Service: Cardiovascular;;  . Lower extremity angiogram Right 04/08/2013    Procedure: LOWER EXTREMITY ANGIOGRAM;  Surgeon: [name],  MD;  Location: [facility] CATH LAB;  Service: Cardiovascular;  Laterality: Right;  . Lower extremity angiogram Right 01/05/2014    Procedure: LOWER EXTREMITY ANGIOGRAM;  Surgeon: [name], MD;  Location: [facility] CATH LAB;  Service: Cardiovascular;  Laterality: Right;   Family History  Problem Relation Age of Onset  . Diabetes Father   . Hypertension Father   . Stroke Father    Social History  Substance Use Topics  . Smoking status: Never Smoker   . Smokeless tobacco: Never Used  . Alcohol Use: 2.4 oz/week    4 Glasses of wine per week     Comment: socially   OB History    No data available     Review of Systems  Respiratory: Positive for cough.        Patient has had a wet sounding cough for the last month but denies any sputum or shortness of breath  All other systems reviewed and are negative.     Allergies  Lipitor; Morphine; Niacin; Nsaids; and Oxycodone-acetaminophen  Home Medications   Prior to Admission medications   Medication Sig Start Date End Date Taking? Authorizing Provider  ALPRAZolam Duanne Moron) 0.25 MG tablet Take 0.25 mg by mouth at bedtime as needed for anxiety or sleep. For sleep    Historical Provider, MD  ALPRAZolam (XANAX) 0.5 MG tablet Take 0.25-0.5 mg by mouth 2 (two) times daily as needed. Anxiety or sleep. 09/18/15   Historical Provider, MD  amLODipine (NORVASC) 5 MG tablet Take 1 tablet (5 mg total) by mouth daily. 08/11/15   Rosalin Hawking, MD  aspirin 81 MG chewable tablet Chew 81 mg by mouth daily.    Historical Provider, MD  calcium carbonate (OS-CAL) 600 MG TABS tablet Take 600 mg by mouth daily.    Historical Provider, MD  clopidogrel (PLAVIX) 75 MG tablet Take 1 tablet (75 mg total) by mouth daily. Patient needs to contact office for additional refills 05/10/15   [name], MD  hydrochlorothiazide 25 MG tablet Take 25 mg by mouth daily.      Historical Provider, MD  HYDROcodone-acetaminophen (NORCO/VICODIN) 5-325 MG per tablet Take 1-2 tablets by  mouth every 4 (four) hours as needed for pain. 10/24/12   Jovita Gamma, MD  Lactobacillus (ACIDOPHILUS) CHEW Chew 1 tablet by mouth daily.    Historical Provider, MD  levothyroxine (SYNTHROID, LEVOTHROID) 75 MCG tablet Take 75 mcg by mouth daily before breakfast.    Historical Provider, MD  Misc Natural Products (OSTEO BI-FLEX ADV DOUBLE ST) TABS Take 1 tablet by mouth daily.    Historical Provider, MD  Omega-3 Fatty Acids (FISH OIL) 1000 MG CAPS Take 1 capsule by mouth daily.    Historical Provider, MD  omeprazole (PRILOSEC) 10 MG capsule Take 20 mg by mouth daily.     Historical Provider, MD  pravastatin (PRAVACHOL) 40 MG tablet Take 1 tablet (40 mg total) by mouth daily at 6 PM. 08/20/15   [name],  MD   BP 147/61 mmHg  Pulse 73  Temp(Src) 98.1 F (36.7 C) (Oral)  Resp 18  Ht 5\' 6"  (1.676 m)  Wt 168 lb (76.204 kg)  BMI 27.13 kg/m2  SpO2 96% Physical Exam  Constitutional: She is oriented to person, place, and time. She appears well-developed and well-nourished. No distress.  Pleasant elderly female  HENT:  Head: Normocephalic and atraumatic.  Mouth/Throat: Oropharynx is clear and moist.  Eyes: Conjunctivae and EOM are normal. Pupils are equal, round, and reactive to light.  Neck: Normal range of motion. Neck supple.  Cardiovascular: Normal rate, regular rhythm and intact distal pulses.   No murmur heard. Pulmonary/Chest: Effort normal. No respiratory distress. She has no wheezes. She has rhonchi in the right lower field and the left lower field. She has no rales.  Abdominal: Soft. She exhibits no distension. There is no tenderness. There is no rebound and no guarding.  Musculoskeletal: Normal range of motion. She exhibits no edema.       Right hip: She exhibits tenderness.       Legs: Dopplered DP pulses bilaterally. Feet are warm with less than 3 second capillary refill bilaterally.  Neurological: She is alert and oriented to person, place, and time.  Skin: Skin is warm  and dry. No rash noted. No erythema.  Psychiatric: She has a normal mood and affect. Her behavior is normal.  Nursing note and vitals reviewed.   ED Course  Procedures (including critical care time) Labs Review Labs Reviewed - No data to display  Imaging Review Dg Chest 2 View  10/08/2015  CLINICAL DATA:  Cough for several days. EXAM: CHEST  2 VIEW COMPARISON:  09/21/2015 FINDINGS: Cardiomediastinal silhouette is normal. Mediastinal contours appear intact. There is no evidence of focal airspace consolidation, pleural effusion or pneumothorax. Osseous structures are without acute abnormality. Soft tissues are grossly normal. IMPRESSION: No active cardiopulmonary disease. Electronically Signed   By: Fidela Salisbury M.D.   On: 10/08/2015 08:57   Dg Hip Unilat With Pelvis Min 4 Views Right  10/08/2015  CLINICAL DATA:  Anterior right hip pain starting this morning. EXAM: DG HIP (WITH OR WITHOUT PELVIS) 4+V RIGHT COMPARISON:  None. FINDINGS: Prominent left and moderate to prominent right craniocaudad and axial loss of articular space compatible with degenerative articular cartilage loss. There is a vascular stent in the vicinity of the right SFA. Lower lumbar posterolateral rod and pedicle screw fixation. No pelvic fracture right hip fracture identified. Suspected degenerative subcortical cyst formation in the right femoral head, likely present on 09/04/2012 as well. IMPRESSION: 1. Prominent left and moderate right degenerative hip arthropathy consisting mostly of articular space narrowing. Degenerative subcortical cyst in the right femoral head, chronic. 2. Right lower extremity vascular stent likely in the superficial femoral artery. Electronically Signed   By: Van Clines M.D.   On: 10/08/2015 08:56   I have personally reviewed and evaluated these images and lab results as part of my medical decision-making.   EKG Interpretation   Date/Time:  Friday October 08 2015 07:26:01  EST Ventricular Rate:  79 PR Interval:  189 QRS Duration: 105 QT Interval:  404 QTC Calculation: 463 R Axis:   51 Text Interpretation:  Sinus rhythm RSR' in V1 or V2, probably normal  variant No significant change since last tracing Confirmed by [facility]   MD, Loree Fee ([id number]) on 10/08/2015 7:36:51 AM      MDM   Final diagnoses:  Pain  Hip pain, right    Patient  is a 82 year old elderly female with peripheral vascular disease status post stenting who presents with worsening right hip pain over the last 2 days. Patient had a fall approximately 3 weeks ago injuring her right wrist and shoulder but states she had no hip pain until yesterday. She denies any new trauma. On exam pain is reproduced by ranging the hip and lateral compression of the hip. She has no pain in the calf or swelling concerning for DVT. Patient did have an MRI done and they told the family that there was something slightly abnormal about her stents which she is to follow-up in the next 2 weeks with her physician but they're not sure what was wrong and there is no record in our system of this imaging. Patient has distal dopplered pulses in her feet are warm with low suspicion for ischemic limb. There is no evidence of a septic joint as there is no redness or swelling. Patient also denies any fevers.  We will get plain films initially and give pain control. This could be bursitis versus radiculopathy versus other bony hip pathology.  9:31 AM Patient's pain was improved with fentanyl. Hip imaging shows prominent left and moderate right degenerative hip arthritis with degenerative changes. Her SFA stent is present.  We'll get a CT to further evaluate for any occult hip pathology. However when speaking with the family a second time they said yesterday prior to her hip hurting when she was at the orthopedist office waiting for an injection she was positioned very strangely on a table to try to get comfortable for greater than 15-20  minutes. The pain in the hip started after that.  11:16 AM CT was negative for acute pathology other than arthritis. Spoke with [name] and based on her most recent ultrasound she does have some recurrent disease in her peripheral arteries but nothing that would cause hip pain. She will follow-up with [name] next week as planned. Patient's pain is more controlled now and discuss with her this is most likely nerve root versus arthritis type pain. Patient has Vicodin at home and an orthopedist she can follow-up with. Discussed this with the family and they are comfortable taking her home. They will return for worsening symptoms.  Low suspicion that patient's pain is related to a AAA or dissection as she has had normal blood pressure, pulse and no abdominal pain or palpable mass.  Blanchie Dessert, MD 10/08/15 1118

## 2015-10-08 NOTE — ED Notes (Signed)
MD at bedside. 

## 2015-10-08 NOTE — ED Notes (Signed)
Pt transported to and from CT scan on stretcher with tech, tolerated well. 

## 2015-10-12 ENCOUNTER — Encounter: Payer: Self-pay | Admitting: Cardiovascular Disease

## 2015-10-12 ENCOUNTER — Ambulatory Visit (INDEPENDENT_AMBULATORY_CARE_PROVIDER_SITE_OTHER): Payer: Medicare Other | Admitting: Cardiovascular Disease

## 2015-10-12 VITALS — BP 134/60 | HR 74 | Ht 66.0 in | Wt 168.4 lb

## 2015-10-12 DIAGNOSIS — I739 Peripheral vascular disease, unspecified: Secondary | ICD-10-CM

## 2015-10-12 DIAGNOSIS — E785 Hyperlipidemia, unspecified: Secondary | ICD-10-CM | POA: Diagnosis not present

## 2015-10-12 DIAGNOSIS — I10 Essential (primary) hypertension: Secondary | ICD-10-CM

## 2015-10-12 NOTE — Patient Instructions (Signed)
Medication Instructions:  Your physician recommends that you continue on your current medications as directed. Please refer to the Current Medication list given to you today.   Labwork: none  Testing/Procedures: Your physician has requested that you have a lower extremity arterial doppler- During this test, ultrasound is used to evaluate arterial blood flow in the legs. Allow approximately one hour for this exam.    Follow-Up: Your physician wants you to follow-up in: 12 months with Dr. Berry. You will receive a reminder letter in the mail two months in advance. If you don't receive a letter, please call our office to schedule the follow-up appointment.   Any Other Special Instructions Will Be Listed Below (If Applicable).     If you need a refill on your cardiac medications before your next appointment, please call your pharmacy.   

## 2015-10-12 NOTE — Assessment & Plan Note (Signed)
History of peripheral arterial disease status post right SFA stenting using viable Viabahn covered stents in 2013. I re-angiogramed her because of a decline in her ABI and discomfort 5/15 revealing patent stent except for the very origin of the right SFA which was 95% stenosed. She had 2 vessel runoff with a diffusely diseased posterior tibial. I restented the origin of her right SFA with a 5 mm x 20 mm long Viabahn. stent with a marked improvement in her Doppler.her most recent Dopplers performed 08/31/15 revealed a right ABI of 1.1 with a high-frequency signal in the origin of the right SFA which was new since her prior Doppler. She recently fell and injured her right shoulder arm and most likely her hip and leg. She does have a palpable dorsalis pedis. I do not think the pain that she is experiencing is vascular in nature at this time. We will continue to monitor her noninvasively.

## 2015-10-12 NOTE — Assessment & Plan Note (Signed)
History of hyperlipidemia on pravastatin followed by her PCP 

## 2015-10-12 NOTE — Assessment & Plan Note (Signed)
History of hypertension blood pressure measures 134/60. She is on amlodipine. Continue current meds at current dosing

## 2015-10-12 NOTE — Progress Notes (Signed)
10/12/2015 Jennifer Atkinson   [birth year]  [id number]  Primary Physician Donnie Coffin, MD Primary Cardiologist: [name] MD Renae Gloss   HPI:  She is a 82 year old mildly overweight widowed Caucasian female, mother of 79 and grandmother to 50 grandchildren, accompanied by one of her daughters today. She was originally referred to me for claudication. I last saw her in the office 02/24/14.  I performed angiogram on her Jan 25, 2012, and revealed segmentally stenosed mid right SFA, which I stented resulting in improvement in her Dopplers; however, her symptoms did not significantly improve. She ultimately had back surgery, which resulted in marked improvement in her symptoms. Followup Dopplers performed December 02, 2012, showed that her stent was functionally occluded with a decrease in her ABIs from the 0.8 range to 0.43, though she denies claudication. In addition, she denies chest pain or shortness of breath. She did have a negative Myoview.  Since I saw her back in May she developed pain in her right foot. Some wan not clear whether this was ischemic versus orthopedic.I examined her on 04/08/13 revealing an occluded right SFA. I ultimately percutaneously revascularized her with overlapping bye-bye and covered stents. Preprocedure ABI was 0.95 and she had marked improvement in her claudication symptoms. Over the last several months she's noticed recurrent right calf claudication with decline in her ABI to .81. She's developed a new high-frequency signal at the origin of her right SFA. I re\re intervention on 01/05/14 revealing a high-grade lesion in the proximal right SFA at the leading edge of the previously placed stent. I re-stented this with a Viabahn covered stent with excellent angiographic, clinical and ultrasonographic result. She recently fell and injured her right shoulder and arm. She also complains of some right hip and thigh pain. Recent Dopplers performed in the office  08/31/15 revealed a right ABI 1.1 with a new high-frequency signal at the previously stented origin of the right SFA.   Current Outpatient Prescriptions  Medication Sig Dispense Refill  . ALPRAZolam (XANAX) 0.25 MG tablet Take 0.25 mg by mouth 2 (two) times daily as needed for anxiety or sleep.    Marland Kitchen amLODipine (NORVASC) 5 MG tablet Take 1 tablet (5 mg total) by mouth daily. 30 tablet 2  . aspirin 81 MG chewable tablet Chew 81 mg by mouth daily.    . calcium carbonate (OS-CAL) 600 MG TABS tablet Take 600 mg by mouth daily.    . clopidogrel (PLAVIX) 75 MG tablet Take 1 tablet (75 mg total) by mouth daily. Patient needs to contact office for additional refills 90 tablet 2  . hydrochlorothiazide 25 MG tablet Take 25 mg by mouth daily.      Marland Kitchen HYDROcodone-acetaminophen (NORCO/VICODIN) 5-325 MG per tablet Take 1-2 tablets by mouth every 4 (four) hours as needed for pain. 50 tablet 0  . Lactobacillus (ACIDOPHILUS) CHEW Chew 1 tablet by mouth daily.    Marland Kitchen levothyroxine (SYNTHROID, LEVOTHROID) 75 MCG tablet Take 75 mcg by mouth daily before breakfast.    . Misc Natural Products (OSTEO BI-FLEX ADV DOUBLE ST) TABS Take 1 tablet by mouth daily.    . Omega-3 Fatty Acids (FISH OIL) 1000 MG CAPS Take 1 capsule by mouth daily.    Marland Kitchen omeprazole (PRILOSEC) 10 MG capsule Take 20 mg by mouth daily.     . pravastatin (PRAVACHOL) 40 MG tablet Take 1 tablet (40 mg total) by mouth daily at 6 PM. 30 tablet 6   No current facility-administered medications for this  visit.    Allergies  Allergen Reactions  . Lipitor [Atorvastatin]     Pain in the back of her calves  . Morphine Other (See Comments)    Other Reaction: GI Upset  . Niacin Other (See Comments)    Other Reaction: Other reaction  . Nsaids     Increased Creatinine  . Oxycodone-Acetaminophen Other (See Comments)    Other Reaction: Other reaction    Social History   Social History  . Marital Status: Widowed    Spouse Name: N/A  . Number of Children:  N/A  . Years of Education: N/A   Occupational History  . Not on file.   Social History Main Topics  . Smoking status: Never Smoker   . Smokeless tobacco: Never Used  . Alcohol Use: 2.4 oz/week    4 Glasses of wine per week     Comment: socially  . Drug Use: No  . Sexual Activity: No   Other Topics Concern  . Not on file   Social History Narrative     Review of Systems: General: negative for chills, fever, night sweats or weight changes.  Cardiovascular: negative for chest pain, dyspnea on exertion, edema, orthopnea, palpitations, paroxysmal nocturnal dyspnea or shortness of breath Dermatological: negative for rash Respiratory: negative for cough or wheezing Urologic: negative for hematuria Abdominal: negative for nausea, vomiting, diarrhea, bright red blood per rectum, melena, or hematemesis Neurologic: negative for visual changes, syncope, or dizziness All other systems reviewed and are otherwise negative except as noted above.    Blood pressure 134/60, pulse 74, height 5\' 6"  (1.676 m), weight 168 lb 6.4 oz (76.386 kg).  General appearance: alert and no distress Neck: no adenopathy, no carotid bruit, no JVD, supple, symmetrical, trachea midline and thyroid not enlarged, symmetric, no tenderness/mass/nodules Lungs: clear to auscultation bilaterally Heart: regular rate and rhythm, S1, S2 normal, no murmur, click, rub or gallop Extremities: extremities normal, atraumatic, no cyanosis or edema  EKG not performed today  ASSESSMENT AND PLAN:   PVD, Rt SFA May 2013, Aug 2014, May 2015 (ISR) History of peripheral arterial disease status post right SFA stenting using viable Viabahn covered stents in 2013. I re-angiogramed her because of a decline in her ABI and discomfort 5/15 revealing patent stent except for the very origin of the right SFA which was 95% stenosed. She had 2 vessel runoff with a diffusely diseased posterior tibial. I restented the origin of her right SFA with a  5 mm x 20 mm long Viabahn. stent with a marked improvement in her Doppler.her most recent Dopplers performed 08/31/15 revealed a right ABI of 1.1 with a high-frequency signal in the origin of the right SFA which was new since her prior Doppler. She recently fell and injured her right shoulder arm and most likely her hip and leg. She does have a palpable dorsalis pedis. I do not think the pain that she is experiencing is vascular in nature at this time. We will continue to monitor her noninvasively.  HTN (hypertension) History of hypertension blood pressure measures 134/60. She is on amlodipine. Continue current meds at current dosing  Dyslipidemia History of hyperlipidemia on pravastatin followed by her PCP      [name] MD [facility], [facility] 10/12/2015 11:15 AM

## 2015-10-13 ENCOUNTER — Ambulatory Visit: Payer: Medicare Other | Admitting: Cardiovascular Disease

## 2015-10-13 DIAGNOSIS — M25531 Pain in right wrist: Secondary | ICD-10-CM | POA: Diagnosis not present

## 2015-10-13 DIAGNOSIS — M25511 Pain in right shoulder: Secondary | ICD-10-CM | POA: Diagnosis not present

## 2015-10-13 DIAGNOSIS — S40011D Contusion of right shoulder, subsequent encounter: Secondary | ICD-10-CM | POA: Diagnosis not present

## 2015-10-13 DIAGNOSIS — S52571D Other intraarticular fracture of lower end of right radius, subsequent encounter for closed fracture with routine healing: Secondary | ICD-10-CM | POA: Diagnosis not present

## 2015-10-18 DIAGNOSIS — M961 Postlaminectomy syndrome, not elsewhere classified: Secondary | ICD-10-CM | POA: Diagnosis not present

## 2015-10-18 DIAGNOSIS — M7061 Trochanteric bursitis, right hip: Secondary | ICD-10-CM | POA: Diagnosis not present

## 2015-10-18 DIAGNOSIS — M5136 Other intervertebral disc degeneration, lumbar region: Secondary | ICD-10-CM | POA: Diagnosis not present

## 2015-10-19 ENCOUNTER — Ambulatory Visit: Payer: Medicare Other | Admitting: Cardiovascular Disease

## 2015-10-20 DIAGNOSIS — M1811 Unilateral primary osteoarthritis of first carpometacarpal joint, right hand: Secondary | ICD-10-CM | POA: Diagnosis not present

## 2015-10-20 DIAGNOSIS — S52571D Other intraarticular fracture of lower end of right radius, subsequent encounter for closed fracture with routine healing: Secondary | ICD-10-CM | POA: Diagnosis not present

## 2015-10-20 DIAGNOSIS — B369 Superficial mycosis, unspecified: Secondary | ICD-10-CM | POA: Diagnosis not present

## 2015-10-20 DIAGNOSIS — S51801D Unspecified open wound of right forearm, subsequent encounter: Secondary | ICD-10-CM | POA: Diagnosis not present

## 2015-10-20 DIAGNOSIS — I119 Hypertensive heart disease without heart failure: Secondary | ICD-10-CM | POA: Diagnosis not present

## 2015-10-28 DIAGNOSIS — S52571D Other intraarticular fracture of lower end of right radius, subsequent encounter for closed fracture with routine healing: Secondary | ICD-10-CM | POA: Diagnosis not present

## 2015-11-03 DIAGNOSIS — M961 Postlaminectomy syndrome, not elsewhere classified: Secondary | ICD-10-CM | POA: Diagnosis not present

## 2015-11-03 DIAGNOSIS — M5136 Other intervertebral disc degeneration, lumbar region: Secondary | ICD-10-CM | POA: Diagnosis not present

## 2015-11-03 DIAGNOSIS — M7061 Trochanteric bursitis, right hip: Secondary | ICD-10-CM | POA: Diagnosis not present

## 2015-11-22 DIAGNOSIS — M7541 Impingement syndrome of right shoulder: Secondary | ICD-10-CM | POA: Diagnosis not present

## 2015-11-22 DIAGNOSIS — Z01812 Encounter for preprocedural laboratory examination: Secondary | ICD-10-CM | POA: Diagnosis not present

## 2015-11-22 DIAGNOSIS — M545 Low back pain: Secondary | ICD-10-CM | POA: Diagnosis not present

## 2015-11-30 DIAGNOSIS — M5126 Other intervertebral disc displacement, lumbar region: Secondary | ICD-10-CM | POA: Diagnosis not present

## 2015-11-30 DIAGNOSIS — M961 Postlaminectomy syndrome, not elsewhere classified: Secondary | ICD-10-CM | POA: Diagnosis not present

## 2015-11-30 DIAGNOSIS — M5136 Other intervertebral disc degeneration, lumbar region: Secondary | ICD-10-CM | POA: Diagnosis not present

## 2015-11-30 DIAGNOSIS — M5416 Radiculopathy, lumbar region: Secondary | ICD-10-CM | POA: Diagnosis not present

## 2015-12-02 DIAGNOSIS — M7541 Impingement syndrome of right shoulder: Secondary | ICD-10-CM | POA: Diagnosis not present

## 2015-12-14 ENCOUNTER — Emergency Department (HOSPITAL_COMMUNITY)
Admission: EM | Admit: 2015-12-14 | Discharge: 2015-12-14 | Disposition: A | Discharge status: Home / Self Care | Payer: Medicare Other | Attending: Emergency Medicine | Admitting: Emergency Medicine

## 2015-12-14 ENCOUNTER — Encounter (HOSPITAL_COMMUNITY): Payer: Self-pay

## 2015-12-14 ENCOUNTER — Emergency Department (HOSPITAL_COMMUNITY): Payer: Medicare Other

## 2015-12-14 DIAGNOSIS — Z8673 Personal history of transient ischemic attack (TIA), and cerebral infarction without residual deficits: Secondary | ICD-10-CM | POA: Insufficient documentation

## 2015-12-14 DIAGNOSIS — N390 Urinary tract infection, site not specified: Secondary | ICD-10-CM | POA: Insufficient documentation

## 2015-12-14 DIAGNOSIS — R918 Other nonspecific abnormal finding of lung field: Secondary | ICD-10-CM | POA: Diagnosis not present

## 2015-12-14 DIAGNOSIS — E785 Hyperlipidemia, unspecified: Secondary | ICD-10-CM | POA: Diagnosis not present

## 2015-12-14 DIAGNOSIS — M549 Dorsalgia, unspecified: Secondary | ICD-10-CM | POA: Diagnosis not present

## 2015-12-14 DIAGNOSIS — I129 Hypertensive chronic kidney disease with stage 1 through stage 4 chronic kidney disease, or unspecified chronic kidney disease: Secondary | ICD-10-CM | POA: Diagnosis not present

## 2015-12-14 DIAGNOSIS — Z79899 Other long term (current) drug therapy: Secondary | ICD-10-CM | POA: Insufficient documentation

## 2015-12-14 DIAGNOSIS — N182 Chronic kidney disease, stage 2 (mild): Secondary | ICD-10-CM | POA: Diagnosis not present

## 2015-12-14 DIAGNOSIS — K219 Gastro-esophageal reflux disease without esophagitis: Secondary | ICD-10-CM | POA: Insufficient documentation

## 2015-12-14 DIAGNOSIS — M546 Pain in thoracic spine: Secondary | ICD-10-CM | POA: Diagnosis not present

## 2015-12-14 DIAGNOSIS — Z7982 Long term (current) use of aspirin: Secondary | ICD-10-CM | POA: Diagnosis not present

## 2015-12-14 DIAGNOSIS — Z7902 Long term (current) use of antithrombotics/antiplatelets: Secondary | ICD-10-CM | POA: Insufficient documentation

## 2015-12-14 DIAGNOSIS — R109 Unspecified abdominal pain: Secondary | ICD-10-CM | POA: Diagnosis not present

## 2015-12-14 LAB — CBC WITH DIFFERENTIAL/PLATELET
BASOS ABS: 0 10*3/uL (ref 0.0–0.1)
Basophils Relative: 0 %
EOS ABS: 0 10*3/uL (ref 0.0–0.7)
EOS PCT: 0 %
HCT: 33.5 % — ABNORMAL LOW (ref 36.0–46.0)
Hemoglobin: 11.3 g/dL — ABNORMAL LOW (ref 12.0–15.0)
LYMPHS ABS: 1.6 10*3/uL (ref 0.7–4.0)
LYMPHS PCT: 8 %
MCH: 29.5 pg (ref 26.0–34.0)
MCHC: 33.7 g/dL (ref 30.0–36.0)
MCV: 87.5 fL (ref 78.0–100.0)
MONO ABS: 1.4 10*3/uL — AB (ref 0.1–1.0)
Monocytes Relative: 7 %
Neutro Abs: 16 10*3/uL — ABNORMAL HIGH (ref 1.7–7.7)
Neutrophils Relative %: 85 %
PLATELETS: 274 10*3/uL (ref 150–400)
RBC: 3.83 MIL/uL — AB (ref 3.87–5.11)
RDW: 15.2 % (ref 11.5–15.5)
WBC: 19 10*3/uL — AB (ref 4.0–10.5)

## 2015-12-14 LAB — URINALYSIS, ROUTINE W REFLEX MICROSCOPIC
GLUCOSE, UA: NEGATIVE mg/dL
HGB URINE DIPSTICK: NEGATIVE
Ketones, ur: NEGATIVE mg/dL
Nitrite: NEGATIVE
PROTEIN: 30 mg/dL — AB
Specific Gravity, Urine: 1.029 (ref 1.005–1.030)
pH: 6 (ref 5.0–8.0)

## 2015-12-14 LAB — BASIC METABOLIC PANEL
Anion gap: 11 (ref 5–15)
BUN: 28 mg/dL — AB (ref 6–20)
CALCIUM: 8.8 mg/dL — AB (ref 8.9–10.3)
CO2: 28 mmol/L (ref 22–32)
CREATININE: 1.15 mg/dL — AB (ref 0.44–1.00)
Chloride: 99 mmol/L — ABNORMAL LOW (ref 101–111)
GFR calc Af Amer: 46 mL/min — ABNORMAL LOW (ref >60)
GFR, EST NON AFRICAN AMERICAN: 40 mL/min — AB (ref >60)
GLUCOSE: 132 mg/dL — AB (ref 65–99)
Potassium: 3 mmol/L — ABNORMAL LOW (ref 3.5–5.1)
SODIUM: 138 mmol/L (ref 135–145)

## 2015-12-14 LAB — URINE MICROSCOPIC-ADD ON

## 2015-12-14 MED ORDER — CEPHALEXIN 500 MG PO CAPS: 500.0000 mg | ORAL_CAPSULE | Freq: Three times a day (TID) | ORAL | Status: DC

## 2015-12-14 MED ORDER — CEPHALEXIN 500 MG PO CAPS
500.0000 mg | ORAL_CAPSULE | Freq: Once | ORAL | Status: AC
  Administered 2015-12-14: 500 mg via ORAL
  Filled 2015-12-14: qty 1

## 2015-12-14 NOTE — Discharge Instructions (Signed)
Back Pain, Adult °Back pain is very common in adults. The cause of back pain is rarely dangerous and the pain often gets better over time. The cause of your back pain may not be known. Some common causes of back pain include: °· Strain of the muscles or ligaments supporting the spine. °· Wear and tear (degeneration) of the spinal disks. °· Arthritis. °· Direct injury to the back. °For many people, back pain may return. Since back pain is rarely dangerous, most people can learn to manage this condition on their own. °HOME CARE INSTRUCTIONS °Watch your back pain for any changes. The following actions may help to lessen any discomfort you are feeling: °· Remain active. It is stressful on your back to sit or stand in one place for long periods of time. Do not sit, drive, or stand in one place for more than 30 minutes at a time. Take short walks on even surfaces as soon as you are able. Try to increase the length of time you walk each day. °· Exercise regularly as directed by your health care provider. Exercise helps your back heal faster. It also helps avoid future injury by keeping your muscles strong and flexible. °· Do not stay in bed. Resting more than 1-2 days can delay your recovery. °· Pay attention to your body when you bend and lift. The most comfortable positions are those that put less stress on your recovering back. Always use proper lifting techniques, including: °· Bending your knees. °· Keeping the load close to your body. °· Avoiding twisting. °· Find a comfortable position to sleep. Use a firm mattress and lie on your side with your knees slightly bent. If you lie on your back, put a pillow under your knees. °· Avoid feeling anxious or stressed. Stress increases muscle tension and can worsen back pain. It is important to recognize when you are anxious or stressed and learn ways to manage it, such as with exercise. °· Take medicines only as directed by your health care provider. Over-the-counter  medicines to reduce pain and inflammation are often the most helpful. Your health care provider may prescribe muscle relaxant drugs. These medicines help dull your pain so you can more quickly return to your normal activities and healthy exercise. °· Apply ice to the injured area: °· Put ice in a plastic bag. °· Place a towel between your skin and the bag. °· Leave the ice on for 20 minutes, 2-3 times a day for the first 2-3 days. After that, ice and heat may be alternated to reduce pain and spasms. °· Maintain a healthy weight. Excess weight puts extra stress on your back and makes it difficult to maintain good posture. °SEEK MEDICAL CARE IF: °· You have pain that is not relieved with rest or medicine. °· You have increasing pain going down into the legs or buttocks. °· You have pain that does not improve in one week. °· You have night pain. °· You lose weight. °· You have a fever or chills. °SEEK IMMEDIATE MEDICAL CARE IF:  °· You develop new bowel or bladder control problems. °· You have unusual weakness or numbness in your arms or legs. °· You develop nausea or vomiting. °· You develop abdominal pain. °· You feel faint. °  °This information is not intended to replace advice given to you by your health care provider. Make sure you discuss any questions you have with your health care provider. °  °Document Released: 08/14/2005 Document Revised: 09/04/2014 Document Reviewed: 12/16/2013 °Elsevier Interactive Patient Education ©2016 Elsevier   Inc. ° °Urinary Tract Infection °Urinary tract infections (UTIs) can develop anywhere along your urinary tract. Your urinary tract is your body's drainage system for removing wastes and extra water. Your urinary tract includes two kidneys, two ureters, a bladder, and a urethra. Your kidneys are a pair of bean-shaped organs. Each kidney is about the size of your fist. They are located below your ribs, one on each side of your spine. °CAUSES °Infections are caused by microbes,  which are microscopic organisms, including fungi, viruses, and bacteria. These organisms are so small that they can only be seen through a microscope. Bacteria are the microbes that most commonly cause UTIs. °SYMPTOMS  °Symptoms of UTIs may vary by age and gender of the patient and by the location of the infection. Symptoms in young women typically include a frequent and intense urge to urinate and a painful, burning feeling in the bladder or urethra during urination. Older women and men are more likely to be tired, shaky, and weak and have muscle aches and abdominal pain. A fever may mean the infection is in your kidneys. Other symptoms of a kidney infection include pain in your back or sides below the ribs, nausea, and vomiting. °DIAGNOSIS °To diagnose a UTI, your caregiver will ask you about your symptoms. Your caregiver will also ask you to provide a urine sample. The urine sample will be tested for bacteria and white blood cells. White blood cells are made by your body to help fight infection. °TREATMENT  °Typically, UTIs can be treated with medication. Because most UTIs are caused by a bacterial infection, they usually can be treated with the use of antibiotics. The choice of antibiotic and length of treatment depend on your symptoms and the type of bacteria causing your infection. °HOME CARE INSTRUCTIONS °· If you were prescribed antibiotics, take them exactly as your caregiver instructs you. Finish the medication even if you feel better after you have only taken some of the medication. °· Drink enough water and fluids to keep your urine clear or pale yellow. °· Avoid caffeine, tea, and carbonated beverages. They tend to irritate your bladder. °· Empty your bladder often. Avoid holding urine for long periods of time. °· Empty your bladder before and after sexual intercourse. °· After a bowel movement, women should cleanse from front to back. Use each tissue only once. °SEEK MEDICAL CARE IF:  °· You have back  pain. °· You develop a fever. °· Your symptoms do not begin to resolve within 3 days. °SEEK IMMEDIATE MEDICAL CARE IF:  °· You have severe back pain or lower abdominal pain. °· You develop chills. °· You have nausea or vomiting. °· You have continued burning or discomfort with urination. °MAKE SURE YOU:  °· Understand these instructions. °· Will watch your condition. °· Will get help right away if you are not doing well or get worse. °  °This information is not intended to replace advice given to you by your health care provider. Make sure you discuss any questions you have with your health care provider. °  °Document Released: 05/24/2005 Document Revised: 05/05/2015 Document Reviewed: 09/22/2011 °Elsevier Interactive Patient Education ©2016 Elsevier Inc. ° °

## 2015-12-14 NOTE — ED Notes (Signed)
PT NOT IN ROOM AT TIME OF COLLECTION  ATTEMPT

## 2015-12-14 NOTE — ED Notes (Signed)
Per [facility]- Pt resides at home. FULL CODE.Pt c/o of left flank pain since last night. Pt dx with bulging disc 3 weeks ago however pain on rt side. Daughter states urine with odor and retention.  Weakness generalized this am. No other complaints. NEG STROKE PER EMS.Vicodin 2 tablets at 11am.

## 2015-12-14 NOTE — ED Notes (Signed)
Bed: WA09 Expected date:  Expected time:  Means of arrival:  Comments: EMS- 82yo F, LL back/flank pain

## 2015-12-14 NOTE — ED Provider Notes (Signed)
CSN: [id number]     Arrival date & time 12/14/15  1159 History   First MD Initiated Contact with Patient 12/14/15 1202     Chief Complaint  Patient presents with  . Flank Pain    left    HPI  She presents relation was an episode of back pain that is since resolved. Patient has history of "bulging disc". Has had some right-sided back pain and pain to her right buttock. Scheduled for epidural steroid injection about 8 days. Been taking Vicodin at home per his weight has some pain in her left back which is unusual for her also describes a strong smelling urine for the last 2 days. No fevers no chills. No falls no injuries or trauma. Had not taken her pain medication this morning with her episode was becoming worse. Today dose of Vicodin at home. Presents here symptom-free.  Past Medical History  Diagnosis Date  . Syncopal episodes 2012    WHILE TAKING NIACIN PT STATES FAINTING  . Chronic kidney disease     Stage 2  . Hypertension   . Hyperlipidemia   . SUI (stress urinary incontinence, female)     Recent Macroplastique  . Peripheral vascular disease ([place]) 5/13, 8/14    Rt SFA PTA with ISR   . PONV (postoperative nausea and vomiting)     "q time I had a baby I was sick when I woke up; doesn't happen anymore" (04/08/2013)  . GERD (gastroesophageal reflux disease)   . Stroke [facility])     TIA   Past Surgical History  Procedure Laterality Date  . Peripheral arterial stent graft Right 01/25/12; 04/08/2013    SFA; redo (04/08/2013)  . Abdominal hysterectomy  1980's  . Orif ankle fracture      right; "had 5 surgeries"  . Fracture surgery    . Appendectomy  1980's    "w/hysterectomy"  . Dilation and curettage of uterus  1980's  . Breast biopsy Right ?1980's    X 2; " benign masses"  . Peripheral vascular angiogram  01/25/2012    Moderately severe segmental right SFA 60-70% stenosis using a 6x156mm Cordis Nitinol Smart self-expanding stent, post-dilatation with a 5x54mm balloon, resulting in  reduction of 60-70% stenosis to 0% residual excellent flow.  . Lower extremity arterial doppler Bilateral 12/02/2012    Right SFA stent-small amount of trickled flow which is consistent w/ greater than 90% diameter reduction with 2 vessel runoff. Left SFA demonstrated mild mixed density plaque not significant with 2 vessel runoff.  . Cardiovascular stress test  01/16/2012    Small. mild intensity mostly fixed septal defect which likely represents artifact. Post-stress EF 76%. No significant abnormalities noted. No lexiscan EKG changes. Non-diagnostic for ischemia.  . Lower extremity angiogram N/A 01/25/2012    Procedure: LOWER EXTREMITY ANGIOGRAM;  Surgeon: [name], MD;  Location: [facility] CATH LAB;  Service: Cardiovascular;  Laterality: N/A;  . Percutaneous stent intervention  01/25/2012    Procedure: PERCUTANEOUS STENT INTERVENTION;  Surgeon: [name], MD;  Location: [facility] CATH LAB;  Service: Cardiovascular;;  . Lower extremity angiogram Right 04/08/2013    Procedure: LOWER EXTREMITY ANGIOGRAM;  Surgeon: [name], MD;  Location: [facility] CATH LAB;  Service: Cardiovascular;  Laterality: Right;  . Lower extremity angiogram Right 01/05/2014    Procedure: LOWER EXTREMITY ANGIOGRAM;  Surgeon: [name], MD;  Location: [facility] CATH LAB;  Service: Cardiovascular;  Laterality: Right;   Family History  Problem Relation Age of Onset  .  Diabetes Father   . Hypertension Father   . Stroke Father    Social History  Substance Use Topics  . Smoking status: Never Smoker   . Smokeless tobacco: Never Used  . Alcohol Use: 2.4 oz/week    4 Glasses of wine per week     Comment: socially   OB History    No data available     Review of Systems  Constitutional: Negative for fever, chills, diaphoresis, appetite change and fatigue.  HENT: Negative for mouth sores, sore throat and trouble swallowing.   Eyes: Negative for visual disturbance.  Respiratory: Negative for cough, chest tightness, shortness  of breath and wheezing.   Cardiovascular: Negative for chest pain.  Gastrointestinal: Negative for nausea, vomiting, abdominal pain, diarrhea and abdominal distention.  Endocrine: Negative for polydipsia, polyphagia and polyuria.  Genitourinary: Positive for frequency and decreased urine volume. Negative for dysuria and hematuria.  Musculoskeletal: Negative for gait problem.  Skin: Negative for color change, pallor and rash.  Neurological: Negative for dizziness, syncope, light-headedness and headaches.  Hematological: Does not bruise/bleed easily.  Psychiatric/Behavioral: Negative for behavioral problems and confusion.      Allergies  Lipitor; Morphine; Niacin; Nsaids; and Oxycodone-acetaminophen  Home Medications   Prior to Admission medications   Medication Sig Start Date End Date Taking? Authorizing Provider  ALPRAZolam Duanne Moron) 0.5 MG tablet Take 0.25-0.5 mg by mouth 2 (two) times daily as needed. 11/17/15  Yes Historical Provider, MD  amLODipine (NORVASC) 5 MG tablet Take 1 tablet (5 mg total) by mouth daily. 08/11/15  Yes Rosalin Hawking, MD  aspirin 81 MG chewable tablet Chew 81 mg by mouth daily.   Yes Historical Provider, MD  calcium carbonate (OS-CAL) 600 MG TABS tablet Take 600 mg by mouth daily.   Yes Historical Provider, MD  clopidogrel (PLAVIX) 75 MG tablet Take 1 tablet (75 mg total) by mouth daily. Patient needs to contact office for additional refills 05/10/15  Yes [name], MD  hydrochlorothiazide 25 MG tablet Take 25 mg by mouth daily.     Yes Historical Provider, MD  HYDROcodone-acetaminophen (NORCO/VICODIN) 5-325 MG per tablet Take 1-2 tablets by mouth every 4 (four) hours as needed for pain. 10/24/12  Yes Jovita Gamma, MD  Lactobacillus (ACIDOPHILUS) CHEW Chew 1 tablet by mouth daily.   Yes Historical Provider, MD  levothyroxine (SYNTHROID, LEVOTHROID) 75 MCG tablet Take 75 mcg by mouth daily before breakfast.   Yes Historical Provider, MD  Misc Natural Products  (OSTEO BI-FLEX ADV DOUBLE ST) TABS Take 1 tablet by mouth daily.   Yes Historical Provider, MD  Omega-3 Fatty Acids (FISH OIL) 1000 MG CAPS Take 1 capsule by mouth daily.   Yes Historical Provider, MD  omeprazole (PRILOSEC) 10 MG capsule Take 20 mg by mouth daily.    Yes Historical Provider, MD  pravastatin (PRAVACHOL) 40 MG tablet Take 1 tablet (40 mg total) by mouth daily at 6 PM. 08/20/15  Yes [name], MD  cephALEXin (KEFLEX) 500 MG capsule Take 1 capsule (500 mg total) by mouth 3 (three) times daily. 12/14/15   Tanna Furry, MD   BP 134/51 mmHg  Pulse 78  Temp(Src) 97.6 F (36.4 C) (Oral)  Resp 20  SpO2 94% Physical Exam  Constitutional: She is oriented to person, place, and time. She appears well-developed and well-nourished. No distress.  HENT:  Head: Normocephalic.  Eyes: Conjunctivae are normal. Pupils are equal, round, and reactive to light. No scleral icterus.  Neck: Normal range of motion. Neck  supple. No thyromegaly present.  Cardiovascular: Normal rate and regular rhythm.  Exam reveals no gallop and no friction rub.   No murmur heard. Pulmonary/Chest: Effort normal and breath sounds normal. No respiratory distress. She has no wheezes. She has no rales.  Abdominal: Soft. Bowel sounds are normal. She exhibits no distension. There is no tenderness. There is no rebound.  Musculoskeletal: Normal range of motion.  Nontender and muscle skeletal paraspinal muscular. No rash or lesions. No intra-abdominal tenderness.  Neurological: She is alert and oriented to person, place, and time.  Skin: Skin is warm and dry. No rash noted.  Psychiatric: She has a normal mood and affect. Her behavior is normal.    ED Course  Procedures (including critical care time) Labs Review Labs Reviewed  URINALYSIS, ROUTINE W REFLEX MICROSCOPIC (NOT AT [facility]) - Abnormal; Notable for the following:    Color, Urine ORANGE (*)    APPearance CLOUDY (*)    Bilirubin Urine SMALL (*)    Protein, ur 30  (*)    Leukocytes, UA SMALL (*)    All other components within normal limits  CBC WITH DIFFERENTIAL/PLATELET - Abnormal; Notable for the following:    WBC 19.0 (*)    RBC 3.83 (*)    Hemoglobin 11.3 (*)    HCT 33.5 (*)    Neutro Abs 16.0 (*)    Monocytes Absolute 1.4 (*)    All other components within normal limits  BASIC METABOLIC PANEL - Abnormal; Notable for the following:    Potassium 3.0 (*)    Chloride 99 (*)    Glucose, Bld 132 (*)    BUN 28 (*)    Creatinine, Ser 1.15 (*)    Calcium 8.8 (*)    GFR calc non Af Amer 40 (*)    GFR calc Af Amer 46 (*)    All other components within normal limits  URINE MICROSCOPIC-ADD ON - Abnormal; Notable for the following:    Squamous Epithelial / LPF 0-5 (*)    Bacteria, UA MANY (*)    All other components within normal limits  URINE CULTURE    Imaging Review Dg Chest 2 View  12/14/2015  CLINICAL DATA:  Upper back pain. No chest complaints. Nonsmoker with history of hypertension. EXAM: CHEST  2 VIEW COMPARISON:  10/08/2015 radiographs. FINDINGS: The heart size and mediastinal contours are stable. There are lower lung volumes with new patchy left basilar pulmonary opacity, most likely representing atelectasis. The right lung is clear. There is no pleural effusion or pneumothorax. No acute osseous findings are evident. IMPRESSION: New patchy left basilar opacity, most likely reflecting atelectasis in the setting of lower lung volumes. No other significant changes. Electronically Signed   By: Richardean Sale M.D.   On: 12/14/2015 13:15   I have personally reviewed and evaluated these images and lab results as part of my medical decision-making.   EKG Interpretation None      MDM   Final diagnoses:  Left-sided back pain, unspecified location  UTI (lower urinary tract infection)    Patient was findings consistent with UTI. Culture pending. Given Keflex. Remainder of her evaluation is normal. Chest x-ray with atelectasis. She has no  pain currently. Plan is home, hydration, continue pain medication, Keflex, continue plans for outpatient steroid injection for her back pain.    Tanna Furry, MD 12/14/15 [phone]

## 2015-12-15 LAB — URINE CULTURE

## 2015-12-20 DIAGNOSIS — M150 Primary generalized (osteo)arthritis: Secondary | ICD-10-CM | POA: Diagnosis not present

## 2015-12-20 DIAGNOSIS — E039 Hypothyroidism, unspecified: Secondary | ICD-10-CM | POA: Diagnosis not present

## 2015-12-20 DIAGNOSIS — I739 Peripheral vascular disease, unspecified: Secondary | ICD-10-CM | POA: Diagnosis not present

## 2015-12-20 DIAGNOSIS — I10 Essential (primary) hypertension: Secondary | ICD-10-CM | POA: Diagnosis not present

## 2015-12-20 DIAGNOSIS — F419 Anxiety disorder, unspecified: Secondary | ICD-10-CM | POA: Diagnosis not present

## 2015-12-20 DIAGNOSIS — K219 Gastro-esophageal reflux disease without esophagitis: Secondary | ICD-10-CM | POA: Diagnosis not present

## 2015-12-20 DIAGNOSIS — N183 Chronic kidney disease, stage 3 (moderate): Secondary | ICD-10-CM | POA: Diagnosis not present

## 2015-12-24 DIAGNOSIS — M5116 Intervertebral disc disorders with radiculopathy, lumbar region: Secondary | ICD-10-CM | POA: Diagnosis not present

## 2015-12-24 DIAGNOSIS — M5126 Other intervertebral disc displacement, lumbar region: Secondary | ICD-10-CM | POA: Diagnosis not present

## 2015-12-27 DIAGNOSIS — M7541 Impingement syndrome of right shoulder: Secondary | ICD-10-CM | POA: Diagnosis not present

## 2016-01-10 DIAGNOSIS — M7541 Impingement syndrome of right shoulder: Secondary | ICD-10-CM | POA: Diagnosis not present

## 2016-01-14 DIAGNOSIS — M5136 Other intervertebral disc degeneration, lumbar region: Secondary | ICD-10-CM | POA: Diagnosis not present

## 2016-01-14 DIAGNOSIS — M5126 Other intervertebral disc displacement, lumbar region: Secondary | ICD-10-CM | POA: Diagnosis not present

## 2016-01-14 DIAGNOSIS — M961 Postlaminectomy syndrome, not elsewhere classified: Secondary | ICD-10-CM | POA: Diagnosis not present

## 2016-01-14 DIAGNOSIS — M5416 Radiculopathy, lumbar region: Secondary | ICD-10-CM | POA: Diagnosis not present

## 2016-02-01 DIAGNOSIS — M7541 Impingement syndrome of right shoulder: Secondary | ICD-10-CM | POA: Diagnosis not present

## 2016-02-01 DIAGNOSIS — M25471 Effusion, right ankle: Secondary | ICD-10-CM | POA: Diagnosis not present

## 2016-02-08 ENCOUNTER — Other Ambulatory Visit: Payer: Self-pay | Admitting: *Deleted

## 2016-02-08 MED ORDER — CLOPIDOGREL BISULFATE 75 MG PO TABS: 75.0000 mg | ORAL_TABLET | Freq: Every day | ORAL | Status: DC

## 2016-02-18 DIAGNOSIS — M5116 Intervertebral disc disorders with radiculopathy, lumbar region: Secondary | ICD-10-CM | POA: Diagnosis not present

## 2016-02-18 DIAGNOSIS — M5416 Radiculopathy, lumbar region: Secondary | ICD-10-CM | POA: Diagnosis not present

## 2016-03-08 ENCOUNTER — Telehealth: Payer: Self-pay | Admitting: *Deleted

## 2016-03-08 DIAGNOSIS — R600 Localized edema: Secondary | ICD-10-CM | POA: Diagnosis not present

## 2016-03-08 DIAGNOSIS — M19171 Post-traumatic osteoarthritis, right ankle and foot: Secondary | ICD-10-CM | POA: Diagnosis not present

## 2016-03-08 NOTE — Telephone Encounter (Signed)
Patient came to front desk wanting to tell the nurse to change her pharmacy.  Message taken and given to triage nurse. Pharmacy entered for [place] at [address].

## 2016-04-03 ENCOUNTER — Other Ambulatory Visit: Payer: Self-pay | Admitting: Cardiovascular Disease

## 2016-04-03 DIAGNOSIS — I739 Peripheral vascular disease, unspecified: Secondary | ICD-10-CM

## 2016-04-10 ENCOUNTER — Ambulatory Visit (HOSPITAL_COMMUNITY)
Admission: RE | Admit: 2016-04-10 | Discharge: 2016-04-10 | Disposition: A | Discharge status: Home / Self Care | Payer: Medicare Other | Source: Ambulatory Visit | Attending: Cardiovascular Disease | Admitting: Cardiovascular Disease

## 2016-04-10 ENCOUNTER — Encounter (HOSPITAL_COMMUNITY): Payer: Medicare Other

## 2016-04-10 DIAGNOSIS — I70203 Unspecified atherosclerosis of native arteries of extremities, bilateral legs: Secondary | ICD-10-CM | POA: Diagnosis not present

## 2016-04-10 DIAGNOSIS — I771 Stricture of artery: Secondary | ICD-10-CM | POA: Diagnosis not present

## 2016-04-10 DIAGNOSIS — K219 Gastro-esophageal reflux disease without esophagitis: Secondary | ICD-10-CM | POA: Insufficient documentation

## 2016-04-10 DIAGNOSIS — R938 Abnormal findings on diagnostic imaging of other specified body structures: Secondary | ICD-10-CM | POA: Diagnosis not present

## 2016-04-10 DIAGNOSIS — E785 Hyperlipidemia, unspecified: Secondary | ICD-10-CM | POA: Insufficient documentation

## 2016-04-10 DIAGNOSIS — I739 Peripheral vascular disease, unspecified: Secondary | ICD-10-CM | POA: Insufficient documentation

## 2016-04-10 DIAGNOSIS — N182 Chronic kidney disease, stage 2 (mild): Secondary | ICD-10-CM | POA: Insufficient documentation

## 2016-04-10 DIAGNOSIS — I129 Hypertensive chronic kidney disease with stage 1 through stage 4 chronic kidney disease, or unspecified chronic kidney disease: Secondary | ICD-10-CM | POA: Diagnosis not present

## 2016-04-10 DIAGNOSIS — I10 Essential (primary) hypertension: Secondary | ICD-10-CM

## 2016-04-21 ENCOUNTER — Other Ambulatory Visit: Payer: Self-pay | Admitting: Cardiovascular Disease

## 2016-04-21 NOTE — Telephone Encounter (Signed)
Rx(s) sent to pharmacy electronically.  

## 2016-05-16 ENCOUNTER — Encounter: Payer: Self-pay | Admitting: Cardiovascular Disease

## 2016-05-16 ENCOUNTER — Ambulatory Visit (INDEPENDENT_AMBULATORY_CARE_PROVIDER_SITE_OTHER): Payer: Medicare Other | Admitting: Cardiovascular Disease

## 2016-05-16 VITALS — BP 130/62 | HR 74 | Ht 65.5 in | Wt 157.4 lb

## 2016-05-16 DIAGNOSIS — I739 Peripheral vascular disease, unspecified: Secondary | ICD-10-CM

## 2016-05-16 DIAGNOSIS — Z79899 Other long term (current) drug therapy: Secondary | ICD-10-CM | POA: Diagnosis not present

## 2016-05-16 DIAGNOSIS — I10 Essential (primary) hypertension: Secondary | ICD-10-CM

## 2016-05-16 DIAGNOSIS — E785 Hyperlipidemia, unspecified: Secondary | ICD-10-CM

## 2016-05-16 NOTE — Assessment & Plan Note (Signed)
History of right SFA stenting 01/25/12. Because of recurrent symptoms I re-angiogramed her 04/08/13 revealing an occluded right SFA which ultimately percutaneously revascularized with overlapping Viabahn stents. Because of a decline in her right ABI and a new high-frequency signal as well as recurrent symptoms I re-angiogram tear 01/05/14 revealing a 95% near ostial right SFA stenosis which I restented using a short Viabahn  stent resulting in improvement in her symptoms and ABI. She has had no recurrent symptoms although her ABIs have fallen 2.87 on the right with a new high-frequency signal in the origin of her right SFA and a left ABI 0.71 high-frequency signal in her distal left SFA.

## 2016-05-16 NOTE — Assessment & Plan Note (Signed)
History of dyslipidemia on pravastatin. We will recheck a lipid and liver profile

## 2016-05-16 NOTE — Assessment & Plan Note (Signed)
History of hypertension with blood pressure measured at 134/60. She is on amlodipine and hydrochlorothiazide. Continue current meds at current dosing

## 2016-05-16 NOTE — Patient Instructions (Signed)
Medication Instructions:  NO CHANGES.  Labwork: Your physician recommends that you return for lab work at your [place]. You will need to be fasting. DO NOT EAT OR DRINK after midnight.   Testing/Procedures: Your physician has requested that you have a lower extremity arterial doppler- During this test, ultrasound is used to evaluate arterial blood flow in the legs. Allow approximately one hour for this exam. IN February 2018 THEN EVERY 6 MONTHS.   Follow-Up: Your physician wants you to follow-up in: [place]. You will receive a reminder letter in the mail two months in advance. If you don't receive a letter, please call our office to schedule the follow-up appointment.   If you need a refill on your cardiac medications before your next appointment, please call your pharmacy.

## 2016-05-16 NOTE — Progress Notes (Signed)
05/16/2016 Jennifer Atkinson   [birth year]  [id number]  Primary Physician Jennifer Coffin, MD Primary Cardiologist: Jennifer Harp MD Jennifer Atkinson  HPI:  She is a 82 year old mildly overweight widowed Caucasian female, mother of 55 and grandmother to 48 grandchildren, accompanied by one of her daughters today. She was originally referred to me for claudication. I last saw her in the office 10/12/15 .  I performed angiogram on her Jan 25, 2012, and revealed segmentally stenosed mid right SFA, which I stented resulting in improvement in her Dopplers; however, her symptoms did not significantly improve. She ultimately had back surgery, which resulted in marked improvement in her symptoms. Followup Dopplers performed December 02, 2012, showed that her stent was functionally occluded with a decrease in her ABIs from the 0.8 range to 0.43, though she denies claudication. In addition, she denies chest pain or shortness of breath. She did have a negative Myoview.  Since I saw her back in May she developed pain in her right foot. Some wan not clear whether this was ischemic versus orthopedic.I examined her on 04/08/13 revealing an occluded right SFA. I ultimately percutaneously revascularized her with overlapping bye-bye and covered stents. Preprocedure ABI was 0.95 and she had marked improvement in her claudication symptoms. Over the last several months she's noticed recurrent right calf claudication with decline in her ABI to .81. She's developed a new high-frequency signal at the origin of her right SFA. I re\re intervention on 01/05/14 revealing a high-grade lesion in the proximal right SFA at the leading edge of the previously placed stent. I re-stented this with a Viabahn covered stent with excellent angiographic, clinical and ultrasonographic result. She recently fell and injured her right shoulder and arm. She also complains of some right hip and thigh pain. Recent Dopplers performed in the office  08/31/15 revealed a right ABI 1.1 with a new high-frequency signal at the previously stented origin of the right SFA. Dopplers performed 04/10/16 revealed a-T signal the origin of the right SFA with an ABI 0.87 which is down from 1.1. She'd also has a new high-frequency signal in her distal left SFA with a reduction in her left ABI down to 0.71 although she denies claudication.   Current Outpatient Prescriptions  Medication Sig Dispense Refill  . ALPRAZolam (XANAX) 0.5 MG tablet Take 0.25-0.5 mg by mouth 2 (two) times daily as needed.  0  . amLODipine (NORVASC) 5 MG tablet Take 1 tablet (5 mg total) by mouth daily. 30 tablet 2  . aspirin 81 MG chewable tablet Chew 81 mg by mouth daily.    . calcium carbonate (OS-CAL) 600 MG TABS tablet Take 600 mg by mouth daily.    . clopidogrel (PLAVIX) 75 MG tablet Take 1 tablet (75 mg total) by mouth daily. 90 tablet 3  . hydrochlorothiazide 25 MG tablet Take 25 mg by mouth daily.      Marland Kitchen HYDROcodone-acetaminophen (NORCO/VICODIN) 5-325 MG per tablet Take 1-2 tablets by mouth every 4 (four) hours as needed for pain. 50 tablet 0  . Lactobacillus (ACIDOPHILUS) CHEW Chew 1 tablet by mouth daily.    Marland Kitchen levothyroxine (SYNTHROID, LEVOTHROID) 75 MCG tablet Take 75 mcg by mouth daily before breakfast.    . Misc Natural Products (OSTEO BI-FLEX ADV DOUBLE ST) TABS Take 1 tablet by mouth daily.    . Omega-3 Fatty Acids (FISH OIL) 1000 MG CAPS Take 1 capsule by mouth daily.    Marland Kitchen omeprazole (PRILOSEC) 10 MG capsule Take 20  mg by mouth daily.     . pravastatin (PRAVACHOL) 40 MG tablet TAKE ONE TABLET BY MOUTH IN THE EVENING AT  6  PM 30 tablet 10   No current facility-administered medications for this visit.     Allergies  Allergen Reactions  . Lipitor [Atorvastatin]     Pain in the back of her calves  . Morphine Other (See Comments)    Other Reaction: GI Upset  . Niacin Other (See Comments)    Other Reaction: Other reaction  . Nsaids     Increased Creatinine  .  Oxycodone-Acetaminophen Other (See Comments)    Other Reaction: Other reaction    Social History   Social History  . Marital status: Widowed    Spouse name: N/A  . Number of children: N/A  . Years of education: N/A   Occupational History  . Not on file.   Social History Main Topics  . Smoking status: Never Smoker  . Smokeless tobacco: Never Used  . Alcohol use 2.4 oz/week    4 Glasses of wine per week     Comment: socially  . Drug use: No  . Sexual activity: No   Other Topics Concern  . Not on file   Social History Narrative  . No narrative on file     Review of Systems: General: negative for chills, fever, night sweats or weight changes.  Cardiovascular: negative for chest pain, dyspnea on exertion, edema, orthopnea, palpitations, paroxysmal nocturnal dyspnea or shortness of breath Dermatological: negative for rash Respiratory: negative for cough or wheezing Urologic: negative for hematuria Abdominal: negative for nausea, vomiting, diarrhea, bright red blood per rectum, melena, or hematemesis Neurologic: negative for visual changes, syncope, or dizziness All other systems reviewed and are otherwise negative except as noted above.    Blood pressure 130/62, pulse 74, height 5' 5.5" (1.664 m), weight 157 lb 6.4 oz (71.4 kg).  General appearance: alert and no distress Neck: no adenopathy, no carotid bruit, no JVD, supple, symmetrical, trachea midline and thyroid not enlarged, symmetric, no tenderness/mass/nodules Lungs: clear to auscultation bilaterally Heart: regular rate and rhythm, S1, S2 normal, no murmur, click, rub or gallop Extremities: extremities normal, atraumatic, no cyanosis or edema  EKG normal sinus rhythm at 74 with septal Q waves. Personally reviewed this EKG  ASSESSMENT AND PLAN:   PVD, Rt SFA May 2013, Aug 2014, May 2015 (ISR) History of right SFA stenting 01/25/12. Because of recurrent symptoms I re-angiogramed her 04/08/13 revealing an occluded  right SFA which ultimately percutaneously revascularized with overlapping Viabahn stents. Because of a decline in her right ABI and a new high-frequency signal as well as recurrent symptoms I re-angiogram tear 01/05/14 revealing a 95% near ostial right SFA stenosis which I restented using a short Viabahn  stent resulting in improvement in her symptoms and ABI. She has had no recurrent symptoms although her ABIs have fallen 2.87 on the right with a new high-frequency signal in the origin of her right SFA and a left ABI 0.71 high-frequency signal in her distal left SFA.  HTN (hypertension) History of hypertension with blood pressure measured at 134/60. She is on amlodipine and hydrochlorothiazide. Continue current meds at current dosing  Dyslipidemia History of dyslipidemia on pravastatin. We will recheck a lipid and liver profile      Jennifer Harp MD [facility], [facility] 05/16/2016 2:34 PM

## 2016-05-17 DIAGNOSIS — I739 Peripheral vascular disease, unspecified: Secondary | ICD-10-CM | POA: Diagnosis not present

## 2016-05-17 DIAGNOSIS — Z79899 Other long term (current) drug therapy: Secondary | ICD-10-CM | POA: Diagnosis not present

## 2016-05-17 DIAGNOSIS — E785 Hyperlipidemia, unspecified: Secondary | ICD-10-CM | POA: Diagnosis not present

## 2016-05-17 LAB — LIPID PANEL
Cholesterol: 193 mg/dL (ref 125–200)
HDL: 69 mg/dL (ref >=46)
LDL CALC: 96 mg/dL (ref <130)
Total CHOL/HDL Ratio: 2.8 Ratio (ref <=5.0)
Triglycerides: 140 mg/dL (ref <150)
VLDL: 28 mg/dL (ref <30)

## 2016-05-17 LAB — HEPATIC FUNCTION PANEL
ALT: 11 U/L (ref 6–29)
AST: 19 U/L (ref 10–35)
Albumin: 4.3 g/dL (ref 3.6–5.1)
Alkaline Phosphatase: 65 U/L (ref 33–130)
BILIRUBIN DIRECT: 0.2 mg/dL (ref <=0.2)
BILIRUBIN INDIRECT: 0.6 mg/dL (ref 0.2–1.2)
Total Bilirubin: 0.8 mg/dL (ref 0.2–1.2)
Total Protein: 7 g/dL (ref 6.1–8.1)

## 2016-05-17 NOTE — Addendum Note (Signed)
Addended by: Vanessa Ralphs on: 05/17/2016 04:36 PM   Modules accepted: Orders

## 2016-05-23 ENCOUNTER — Encounter: Payer: Self-pay | Admitting: *Deleted

## 2016-05-31 DIAGNOSIS — M5136 Other intervertebral disc degeneration, lumbar region: Secondary | ICD-10-CM | POA: Diagnosis not present

## 2016-06-19 DIAGNOSIS — M961 Postlaminectomy syndrome, not elsewhere classified: Secondary | ICD-10-CM | POA: Diagnosis not present

## 2016-06-19 DIAGNOSIS — M1288 Other specific arthropathies, not elsewhere classified, other specified site: Secondary | ICD-10-CM | POA: Diagnosis not present

## 2016-06-21 DIAGNOSIS — M25512 Pain in left shoulder: Secondary | ICD-10-CM | POA: Diagnosis not present

## 2016-06-21 DIAGNOSIS — M7541 Impingement syndrome of right shoulder: Secondary | ICD-10-CM | POA: Diagnosis not present

## 2016-06-26 DIAGNOSIS — N183 Chronic kidney disease, stage 3 (moderate): Secondary | ICD-10-CM | POA: Diagnosis not present

## 2016-06-26 DIAGNOSIS — Z23 Encounter for immunization: Secondary | ICD-10-CM | POA: Diagnosis not present

## 2016-06-26 DIAGNOSIS — E039 Hypothyroidism, unspecified: Secondary | ICD-10-CM | POA: Diagnosis not present

## 2016-06-26 DIAGNOSIS — M150 Primary generalized (osteo)arthritis: Secondary | ICD-10-CM | POA: Diagnosis not present

## 2016-06-26 DIAGNOSIS — K219 Gastro-esophageal reflux disease without esophagitis: Secondary | ICD-10-CM | POA: Diagnosis not present

## 2016-06-26 DIAGNOSIS — I10 Essential (primary) hypertension: Secondary | ICD-10-CM | POA: Diagnosis not present

## 2016-06-26 DIAGNOSIS — I739 Peripheral vascular disease, unspecified: Secondary | ICD-10-CM | POA: Diagnosis not present

## 2016-06-26 DIAGNOSIS — F419 Anxiety disorder, unspecified: Secondary | ICD-10-CM | POA: Diagnosis not present

## 2016-06-28 DIAGNOSIS — M1288 Other specific arthropathies, not elsewhere classified, other specified site: Secondary | ICD-10-CM | POA: Diagnosis not present

## 2016-06-28 DIAGNOSIS — M47816 Spondylosis without myelopathy or radiculopathy, lumbar region: Secondary | ICD-10-CM | POA: Diagnosis not present

## 2016-06-28 DIAGNOSIS — M5136 Other intervertebral disc degeneration, lumbar region: Secondary | ICD-10-CM | POA: Diagnosis not present

## 2016-07-03 ENCOUNTER — Other Ambulatory Visit: Payer: Self-pay | Admitting: *Deleted

## 2016-07-03 MED ORDER — PRAVASTATIN SODIUM 40 MG PO TABS: ORAL_TABLET | ORAL | 3 refills | Status: DC

## 2016-07-03 NOTE — Telephone Encounter (Signed)
Rx request sent to pharmacy.  

## 2016-07-03 NOTE — Telephone Encounter (Signed)
Patients daughter requested that this be changed to a ninety day supply.

## 2016-07-04 ENCOUNTER — Other Ambulatory Visit: Payer: Self-pay

## 2016-07-04 MED ORDER — PRAVASTATIN SODIUM 40 MG PO TABS: ORAL_TABLET | ORAL | 3 refills | Status: DC

## 2016-08-14 DIAGNOSIS — S81819A Laceration without foreign body, unspecified lower leg, initial encounter: Secondary | ICD-10-CM | POA: Diagnosis not present

## 2016-08-16 DIAGNOSIS — M47816 Spondylosis without myelopathy or radiculopathy, lumbar region: Secondary | ICD-10-CM | POA: Diagnosis not present

## 2016-08-30 ENCOUNTER — Telehealth: Payer: Self-pay | Admitting: Cardiovascular Disease

## 2016-08-30 NOTE — Telephone Encounter (Signed)
Spoke w daughter of patient (DPR). Advised to keep upcoming appt for imaging to which she voiced understanding. Explained rationale for LE arterial and that it has been 2+ years since last study - would recommend keeping. Aware to call if further questions or concerns, o/w will keep appt for upcoming study.

## 2016-08-30 NOTE — Telephone Encounter (Signed)
New Message  Pts daughter voiced she is calling in regards to CT scans to see if they're necessary due to pt having stints.  Pts daughter voiced pt voiced she is not wanting to come to OV's due to being in the 30's.  Please f/u

## 2016-09-09 DIAGNOSIS — N390 Urinary tract infection, site not specified: Secondary | ICD-10-CM | POA: Diagnosis not present

## 2016-09-09 DIAGNOSIS — R300 Dysuria: Secondary | ICD-10-CM | POA: Diagnosis not present

## 2016-09-21 ENCOUNTER — Other Ambulatory Visit: Payer: Self-pay | Admitting: Cardiovascular Disease

## 2016-09-21 DIAGNOSIS — E785 Hyperlipidemia, unspecified: Secondary | ICD-10-CM

## 2016-09-21 DIAGNOSIS — I739 Peripheral vascular disease, unspecified: Secondary | ICD-10-CM

## 2016-09-21 DIAGNOSIS — Z79899 Other long term (current) drug therapy: Secondary | ICD-10-CM

## 2016-10-09 ENCOUNTER — Ambulatory Visit (HOSPITAL_COMMUNITY)
Admission: RE | Admit: 2016-10-09 | Discharge: 2016-10-09 | Disposition: A | Discharge status: Home / Self Care | Payer: Medicare Other | Source: Ambulatory Visit | Attending: Cardiovascular Disease | Admitting: Cardiovascular Disease

## 2016-10-09 ENCOUNTER — Encounter (HOSPITAL_COMMUNITY): Payer: Medicare Other

## 2016-10-09 DIAGNOSIS — I739 Peripheral vascular disease, unspecified: Secondary | ICD-10-CM | POA: Diagnosis not present

## 2016-10-11 ENCOUNTER — Other Ambulatory Visit: Payer: Self-pay | Admitting: Cardiovascular Disease

## 2016-10-11 DIAGNOSIS — I739 Peripheral vascular disease, unspecified: Secondary | ICD-10-CM

## 2016-10-31 DIAGNOSIS — M47816 Spondylosis without myelopathy or radiculopathy, lumbar region: Secondary | ICD-10-CM | POA: Diagnosis not present

## 2016-12-08 DIAGNOSIS — R350 Frequency of micturition: Secondary | ICD-10-CM | POA: Diagnosis not present

## 2016-12-08 DIAGNOSIS — N3000 Acute cystitis without hematuria: Secondary | ICD-10-CM | POA: Diagnosis not present

## 2017-01-01 DIAGNOSIS — N183 Chronic kidney disease, stage 3 (moderate): Secondary | ICD-10-CM | POA: Diagnosis not present

## 2017-01-01 DIAGNOSIS — K219 Gastro-esophageal reflux disease without esophagitis: Secondary | ICD-10-CM | POA: Diagnosis not present

## 2017-01-01 DIAGNOSIS — M150 Primary generalized (osteo)arthritis: Secondary | ICD-10-CM | POA: Diagnosis not present

## 2017-01-01 DIAGNOSIS — I739 Peripheral vascular disease, unspecified: Secondary | ICD-10-CM | POA: Diagnosis not present

## 2017-01-01 DIAGNOSIS — F419 Anxiety disorder, unspecified: Secondary | ICD-10-CM | POA: Diagnosis not present

## 2017-01-01 DIAGNOSIS — E039 Hypothyroidism, unspecified: Secondary | ICD-10-CM | POA: Diagnosis not present

## 2017-01-01 DIAGNOSIS — I10 Essential (primary) hypertension: Secondary | ICD-10-CM | POA: Diagnosis not present

## 2017-01-30 DIAGNOSIS — M47816 Spondylosis without myelopathy or radiculopathy, lumbar region: Secondary | ICD-10-CM | POA: Diagnosis not present

## 2017-02-01 ENCOUNTER — Other Ambulatory Visit: Payer: Self-pay | Admitting: Cardiovascular Disease

## 2017-02-01 NOTE — Telephone Encounter (Signed)
PT NEEDS TO SCHEDULE A FOLLOW UP APPT

## 2017-02-07 DIAGNOSIS — M19011 Primary osteoarthritis, right shoulder: Secondary | ICD-10-CM | POA: Diagnosis not present

## 2017-02-07 DIAGNOSIS — M7541 Impingement syndrome of right shoulder: Secondary | ICD-10-CM | POA: Diagnosis not present

## 2017-04-18 DIAGNOSIS — M47816 Spondylosis without myelopathy or radiculopathy, lumbar region: Secondary | ICD-10-CM | POA: Diagnosis not present

## 2017-04-21 DIAGNOSIS — N390 Urinary tract infection, site not specified: Secondary | ICD-10-CM | POA: Diagnosis not present

## 2017-06-07 DIAGNOSIS — M47816 Spondylosis without myelopathy or radiculopathy, lumbar region: Secondary | ICD-10-CM | POA: Diagnosis not present

## 2017-06-13 ENCOUNTER — Encounter: Payer: Self-pay | Admitting: Cardiovascular Disease

## 2017-06-13 ENCOUNTER — Ambulatory Visit (INDEPENDENT_AMBULATORY_CARE_PROVIDER_SITE_OTHER): Payer: Medicare Other | Admitting: Cardiovascular Disease

## 2017-06-13 DIAGNOSIS — I10 Essential (primary) hypertension: Secondary | ICD-10-CM

## 2017-06-13 DIAGNOSIS — I739 Peripheral vascular disease, unspecified: Secondary | ICD-10-CM | POA: Diagnosis not present

## 2017-06-13 DIAGNOSIS — E785 Hyperlipidemia, unspecified: Secondary | ICD-10-CM

## 2017-06-13 NOTE — Progress Notes (Signed)
06/13/2017 Jennifer Atkinson   1919/10/13  [id number]  Primary Physician Alroy Dust, L.Marlou Sa, MD Primary Cardiologist: [name] MD FACP, [place], [place], [place]  HPI:  Jennifer Atkinson is a 83 y.o. female mildly overweight widowed Caucasian female, mother of 72 and grandmother to 47 grandchildren, accompanied by one of her daughters today. She was originally referred to me for claudication. I last saw her in the office 05/16/16 .  I performed angiogram on her Jan 25, 2012, and revealed segmentally stenosed mid right SFA, which I stented resulting in improvement in her Dopplers; however, her symptoms did not significantly improve. She ultimately had back surgery, which resulted in marked improvement in her symptoms. Followup Dopplers performed December 02, 2012, showed that her stent was functionally occluded with a decrease in her ABIs from the 0.8 range to 0.43, though she denies claudication. In addition, she denies chest pain or shortness of breath. She did have a negative Myoview.  Since I saw her back in May she developed pain in her right foot. Some wan not clear whether this was ischemic versus orthopedic.I examined her on 04/08/13 revealing an occluded right SFA. I ultimately percutaneously revascularized her with overlapping bye-bye and covered stents. Preprocedure ABI was 0.95 and she had marked improvement in her claudication symptoms. Over the last several months she's noticed recurrent right calf claudication with decline in her ABI to .81. She's developed a new high-frequency signal at the origin of her right SFA. I re\re intervention on 01/05/14 revealing a high-grade lesion in the proximal right SFA at the leading edge of the previously placed stent. I re-stented this with a Viabahn covered stent with excellent angiographic, clinical and ultrasonographic result. She recently fell and injured her right shoulder and arm. She also complains of some right hip and thigh pain. Recent Dopplers performed  in the office 08/31/15 revealed a right ABI 1.1 with a new high-frequency signal at the previously stented origin of the right SFA. Dopplers performed 04/10/16 revealed a-T signal the origin of the right SFA with an ABI 0.87 which is down from 1.1. She'd also has a new high-frequency signal in her distal left SFA with a reduction in her left ABI down to 0.71 although she denies claudication. Her most recent Dopplers performed 10/09/16 revealed a right ABI of 0.87 with a widely patent stent in the left ABI 0.7.   Current Meds  Medication Sig  . ALPRAZolam (XANAX) 0.5 MG tablet Take 0.25-0.5 mg by mouth 2 (two) times daily as needed.  Marland Kitchen amLODipine (NORVASC) 5 MG tablet Take 1 tablet (5 mg total) by mouth daily.  Marland Kitchen aspirin 81 MG chewable tablet Chew 81 mg by mouth daily.  . calcium carbonate (OS-CAL) 600 MG TABS tablet Take 600 mg by mouth daily.  . clopidogrel (PLAVIX) 75 MG tablet Take 1 tablet (75 mg total) by mouth daily. PT NEEDS TO SCHEDULE AN OFFICE VISIT  . hydrochlorothiazide 25 MG tablet Take 25 mg by mouth daily.    Marland Kitchen HYDROcodone-acetaminophen (NORCO/VICODIN) 5-325 MG per tablet Take 1-2 tablets by mouth every 4 (four) hours as needed for pain.  . Lactobacillus (ACIDOPHILUS) CHEW Chew 1 tablet by mouth daily.  Marland Kitchen levothyroxine (SYNTHROID, LEVOTHROID) 75 MCG tablet Take 75 mcg by mouth daily before breakfast.  . Misc Natural Products (OSTEO BI-FLEX ADV DOUBLE ST) TABS Take 1 tablet by mouth daily.  . Omega-3 Fatty Acids (FISH OIL) 1000 MG CAPS Take 1 capsule by mouth daily.  Marland Kitchen omeprazole (PRILOSEC)  10 MG capsule Take 20 mg by mouth daily.   . pravastatin (PRAVACHOL) 40 MG tablet TAKE ONE TABLET BY MOUTH IN THE EVENING AT  6  PM     Allergies  Allergen Reactions  . Lipitor [Atorvastatin]     Pain in the back of her calves  . Morphine Other (See Comments)    Other Reaction: GI Upset  . Niacin Other (See Comments)    Other Reaction: Other reaction  . Nsaids     Increased Creatinine  .  Oxycodone-Acetaminophen Other (See Comments)    Other Reaction: Other reaction    Social History   Social History  . Marital status: Widowed    Spouse name: N/A  . Number of children: N/A  . Years of education: N/A   Occupational History  . Not on file.   Social History Main Topics  . Smoking status: Never Smoker  . Smokeless tobacco: Never Used  . Alcohol use 2.4 oz/week    4 Glasses of wine per week     Comment: socially  . Drug use: No  . Sexual activity: No   Other Topics Concern  . Not on file   Social History Narrative  . No narrative on file     Review of Systems: General: negative for chills, fever, night sweats or weight changes.  Cardiovascular: negative for chest pain, dyspnea on exertion, edema, orthopnea, palpitations, paroxysmal nocturnal dyspnea or shortness of breath Dermatological: negative for rash Respiratory: negative for cough or wheezing Urologic: negative for hematuria Abdominal: negative for nausea, vomiting, diarrhea, bright red blood per rectum, melena, or hematemesis Neurologic: negative for visual changes, syncope, or dizziness All other systems reviewed and are otherwise negative except as noted above.    Blood pressure (!) 144/56, pulse 79, height 5\' 6"  (1.676 m), weight 155 lb 3.2 oz (70.4 kg).  General appearance: alert and no distress Neck: no adenopathy, no carotid bruit, no JVD, supple, symmetrical, trachea midline and thyroid not enlarged, symmetric, no tenderness/mass/nodules Lungs: clear to auscultation bilaterally Heart: regular rate and rhythm, S1, S2 normal, no murmur, click, rub or gallop Extremities: extremities normal, atraumatic, no cyanosis or edema Pulses: 2+ and symmetric Skin: Skin color, texture, turgor normal. No rashes or lesions Neurologic: Alert and oriented X 3, normal strength and tone. Normal symmetric reflexes. Normal coordination and gait  EKG sinus rhythm at 79 without ST or T-wave changes. I personally  reviewed his EKG.  ASSESSMENT AND PLAN:   PVD, Rt SFA May 2013, Aug 2014, May 2015 (ISR) History of peripheral arterial disease status post right SFA stenting by myself initially 01/25/12. She's had 3 interventions in total the last one being 01/05/14 with a Viabahn  covered stent. She had excellent angiographic, clinical and ultrasound result at that time. She has claudication. Her most recent lower extremity Doppler studies performed 10/09/16 revealed a right ABI 0.87 with a widely patent stent. We will repeat this on an annual basis.  HTN (hypertension) History of essential hypertension with blood pressure measured today at 144/56. She is on amlodipine, hydrochlorothiazide. Continue current meds at current dosing.  Dyslipidemia History of hyperlipidemia on statin therapy followed by her PCP      [name] MD [facility], [facility] 06/13/2017 10:22 AM

## 2017-06-13 NOTE — Assessment & Plan Note (Signed)
History of hyperlipidemia on statin therapy followed by her PCP. 

## 2017-06-13 NOTE — Patient Instructions (Signed)
Medication Instructions: Your physician recommends that you continue on your current medications as directed. Please refer to the Current Medication list given to you today.  Testing: Please have lower extremity dopplers in February 2019.  Follow-Up: Your physician wants you to follow-up in: 1 year with [name]. You will receive a reminder letter in the mail two months in advance. If you don't receive a letter, please call our office to schedule the follow-up appointment.  If you need a refill on your cardiac medications before your next appointment, please call your pharmacy.

## 2017-06-13 NOTE — Assessment & Plan Note (Signed)
History of peripheral arterial disease status post right SFA stenting by myself initially 01/25/12. She's had 3 interventions in total the last one being 01/05/14 with a Viabahn  covered stent. She had excellent angiographic, clinical and ultrasound result at that time. She has claudication. Her most recent lower extremity Doppler studies performed 10/09/16 revealed a right ABI 0.87 with a widely patent stent. We will repeat this on an annual basis.

## 2017-06-13 NOTE — Addendum Note (Signed)
Addended by: Zebedee Iba on: 06/13/2017 04:40 PM   Modules accepted: Orders

## 2017-06-13 NOTE — Assessment & Plan Note (Signed)
History of essential hypertension with blood pressure measured today at 144/56. She is on amlodipine, hydrochlorothiazide. Continue current meds at current dosing.

## 2017-06-28 DIAGNOSIS — Z23 Encounter for immunization: Secondary | ICD-10-CM | POA: Diagnosis not present

## 2017-07-09 DIAGNOSIS — M150 Primary generalized (osteo)arthritis: Secondary | ICD-10-CM | POA: Diagnosis not present

## 2017-07-09 DIAGNOSIS — E039 Hypothyroidism, unspecified: Secondary | ICD-10-CM | POA: Diagnosis not present

## 2017-07-09 DIAGNOSIS — I10 Essential (primary) hypertension: Secondary | ICD-10-CM | POA: Diagnosis not present

## 2017-07-09 DIAGNOSIS — I739 Peripheral vascular disease, unspecified: Secondary | ICD-10-CM | POA: Diagnosis not present

## 2017-07-09 DIAGNOSIS — N183 Chronic kidney disease, stage 3 (moderate): Secondary | ICD-10-CM | POA: Diagnosis not present

## 2017-07-09 DIAGNOSIS — F419 Anxiety disorder, unspecified: Secondary | ICD-10-CM | POA: Diagnosis not present

## 2017-07-09 DIAGNOSIS — K219 Gastro-esophageal reflux disease without esophagitis: Secondary | ICD-10-CM | POA: Diagnosis not present

## 2017-08-09 ENCOUNTER — Other Ambulatory Visit: Payer: Self-pay | Admitting: Internal Medicine

## 2017-08-09 ENCOUNTER — Other Ambulatory Visit: Payer: Self-pay | Admitting: Cardiovascular Disease

## 2017-08-10 NOTE — Telephone Encounter (Signed)
REFILL 

## 2017-08-31 DIAGNOSIS — M7541 Impingement syndrome of right shoulder: Secondary | ICD-10-CM | POA: Diagnosis not present

## 2017-08-31 DIAGNOSIS — M7542 Impingement syndrome of left shoulder: Secondary | ICD-10-CM | POA: Diagnosis not present

## 2017-08-31 DIAGNOSIS — M19011 Primary osteoarthritis, right shoulder: Secondary | ICD-10-CM | POA: Diagnosis not present

## 2017-08-31 DIAGNOSIS — M19019 Primary osteoarthritis, unspecified shoulder: Secondary | ICD-10-CM | POA: Diagnosis not present

## 2017-08-31 DIAGNOSIS — M19012 Primary osteoarthritis, left shoulder: Secondary | ICD-10-CM | POA: Diagnosis not present

## 2017-09-08 DIAGNOSIS — N390 Urinary tract infection, site not specified: Secondary | ICD-10-CM | POA: Diagnosis not present

## 2017-09-17 DIAGNOSIS — M19012 Primary osteoarthritis, left shoulder: Secondary | ICD-10-CM | POA: Diagnosis not present

## 2017-09-17 DIAGNOSIS — M25512 Pain in left shoulder: Secondary | ICD-10-CM | POA: Diagnosis not present

## 2017-09-17 DIAGNOSIS — M19011 Primary osteoarthritis, right shoulder: Secondary | ICD-10-CM | POA: Diagnosis not present

## 2017-09-17 DIAGNOSIS — M25511 Pain in right shoulder: Secondary | ICD-10-CM | POA: Diagnosis not present

## 2017-09-22 IMAGING — CR DG SHOULDER 2+V*R*
2 series · 2 of 2 positions shown · non-contrast
Comparison: None.

CLINICAL DATA: Pain and swelling

EXAM:
RIGHT SHOULDER - 2+ VIEW

[x shoulder ap right (1 of 2)]
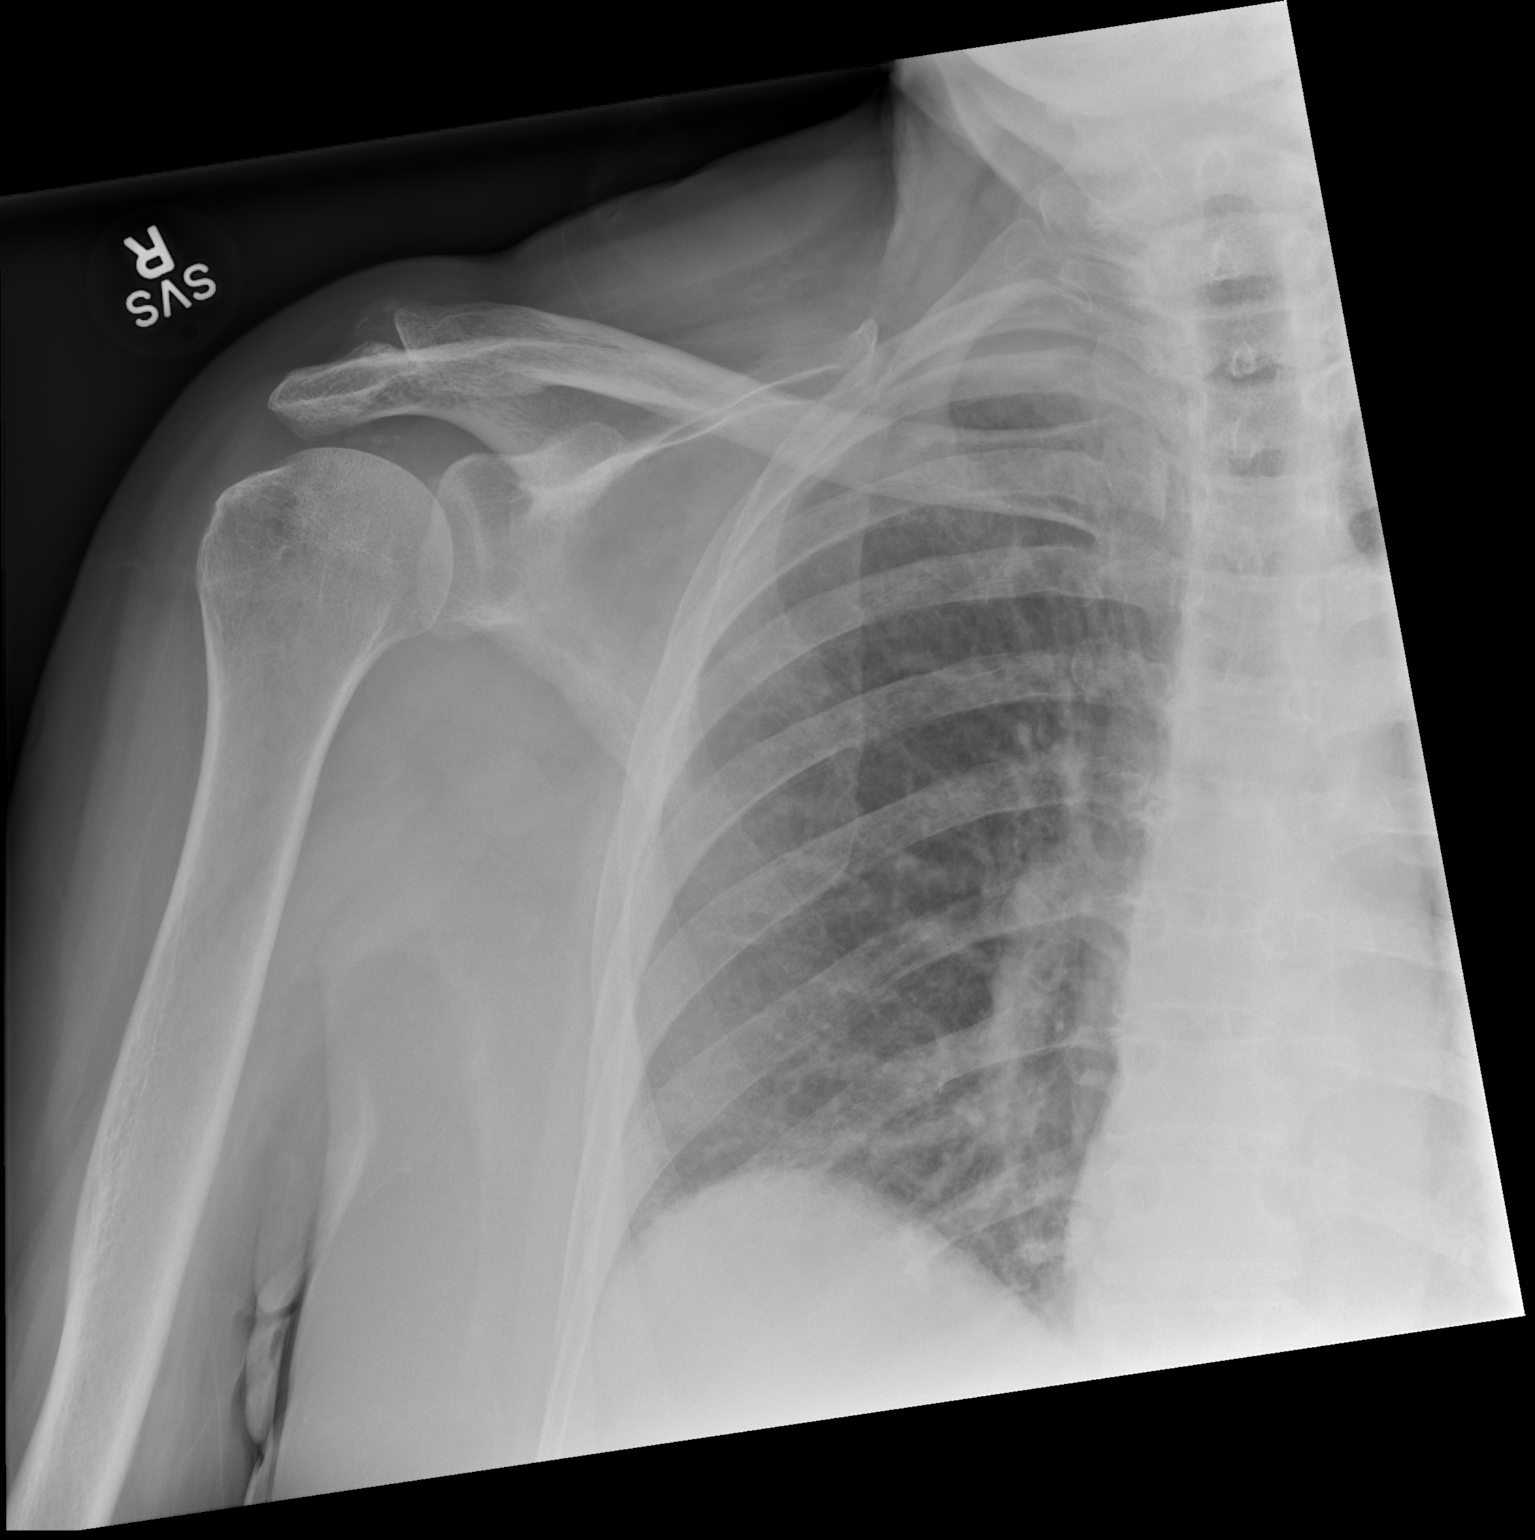

[x shoulder ap right (2 of 2)]
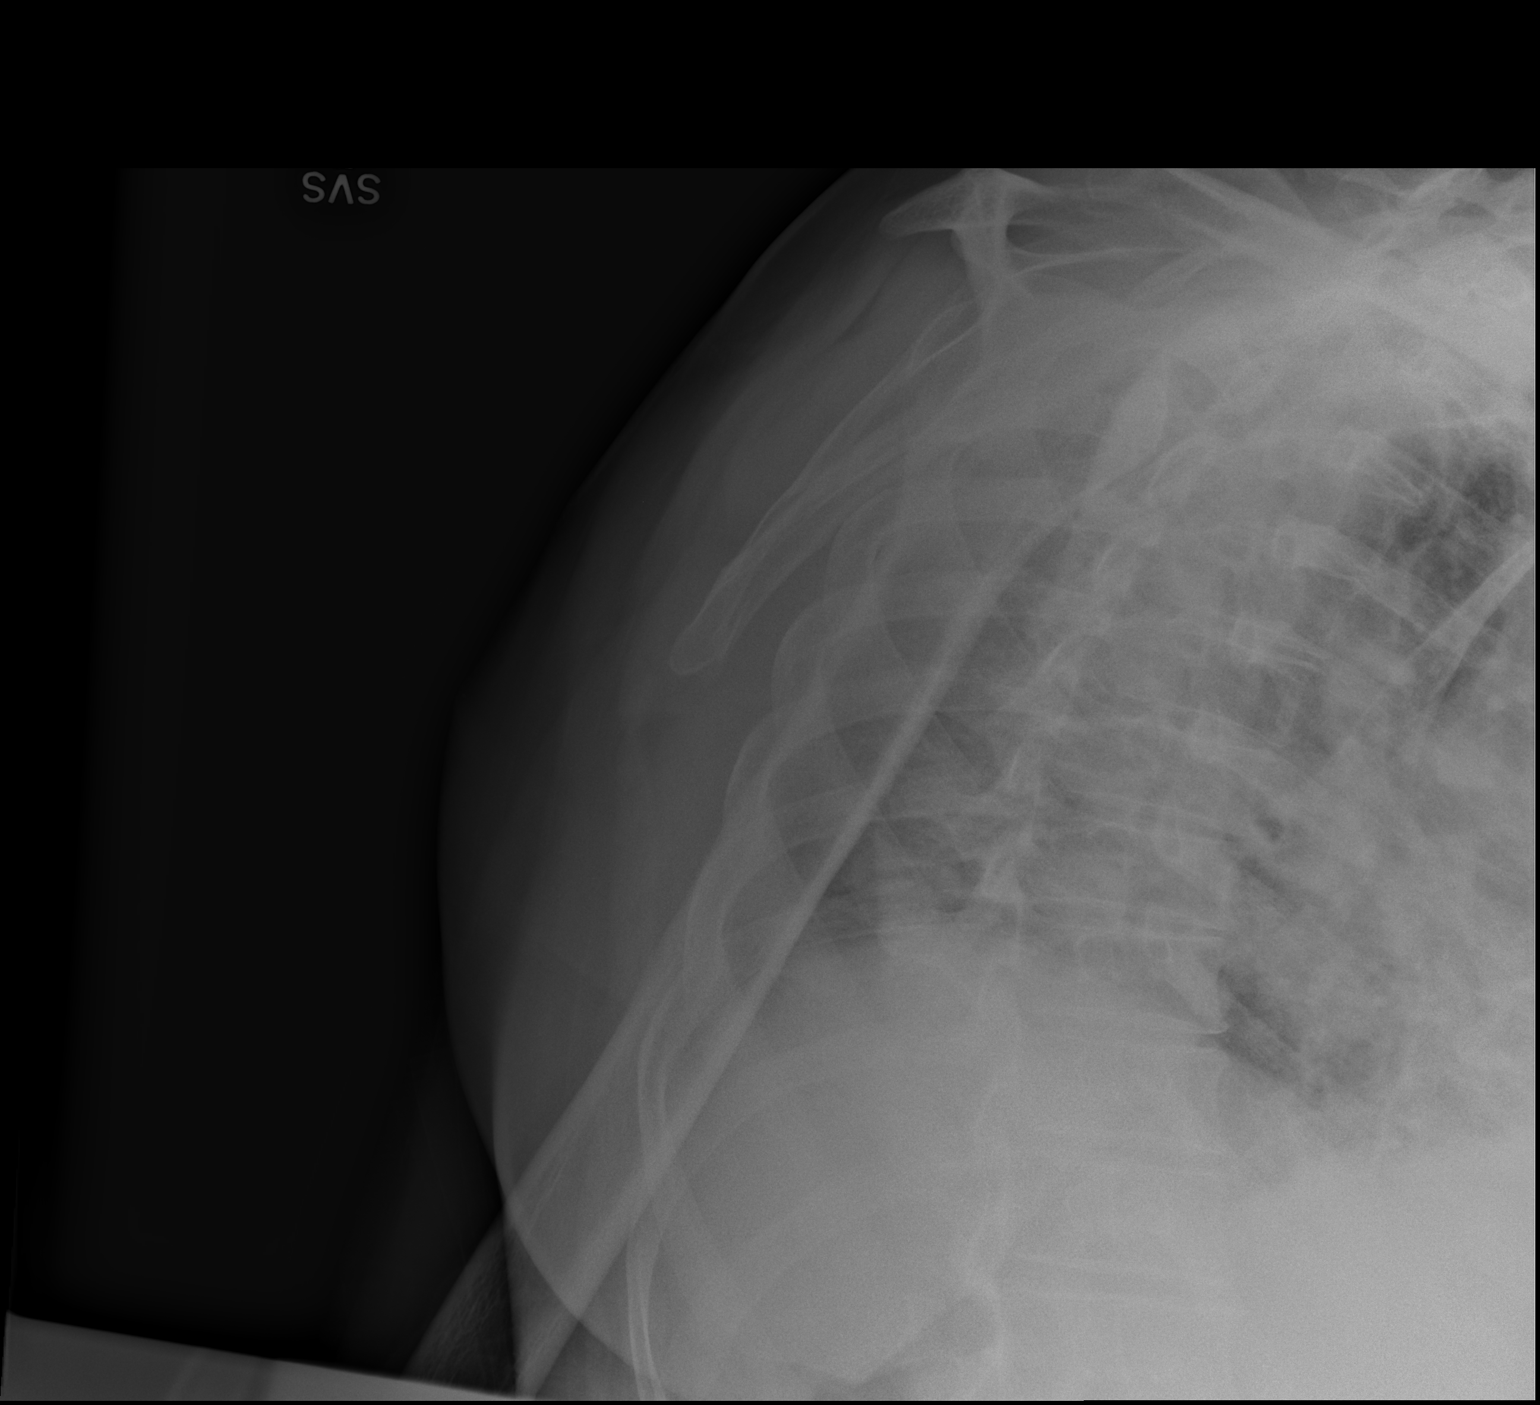

[2 of 2 positions shown; findings below may reference images not displayed]

FINDINGS: Frontal and Y scapular images were obtained. There is no
demonstrable fracture or dislocation. There is mild narrowing of the
acromioclavicular joint. The glenohumeral joint appears normal.
There are foci of calcification superior to the acromioclavicular
joint and along the inferior surface of the acromion. No erosive
change.
IMPRESSION: Osteoarthritic change in the acromioclavicular joint. Areas of
calcification, likely representing calcific tendinosis. No fracture
or dislocation.

## 2017-09-22 IMAGING — CR DG HAND COMPLETE 3+V*R*
3 series · 3 of 3 positions shown · non-contrast
Comparison: Right wrist radiographs obtained at the same time.

CLINICAL DATA: Right hand and arm pain following a fall yesterday
in her yard. The patient has diffuse swelling and is unable to
remove her rings.

EXAM:
RIGHT HAND - COMPLETE 3+ VIEW

[x hand pa right]
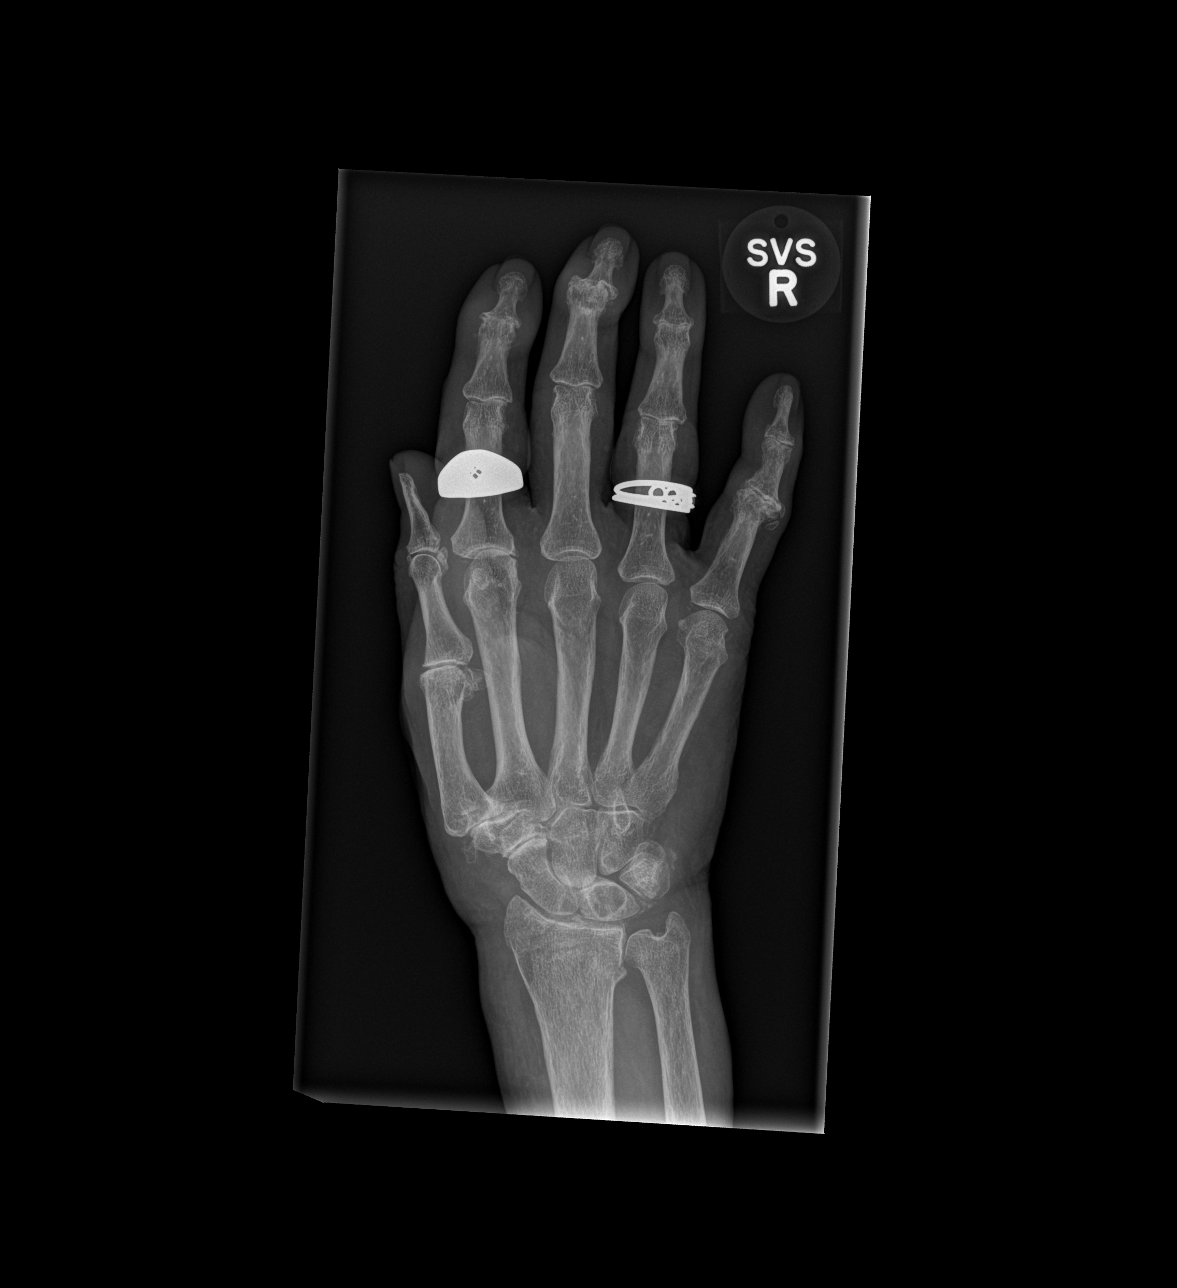

[x hand obl right]
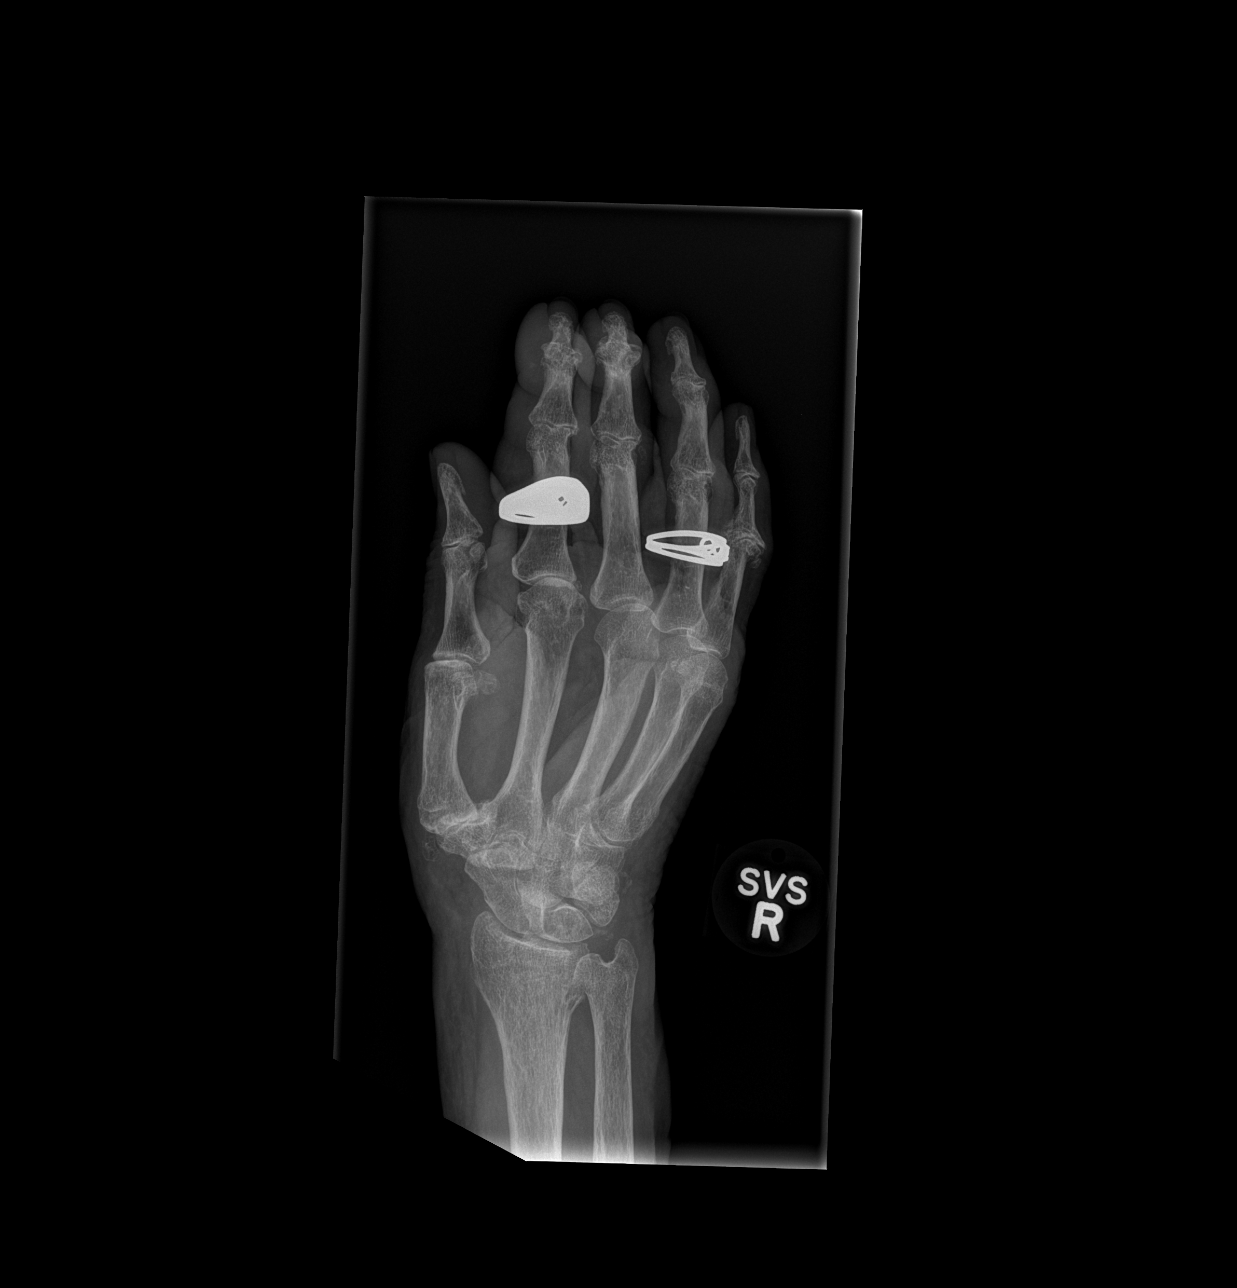

[x hand lat right]
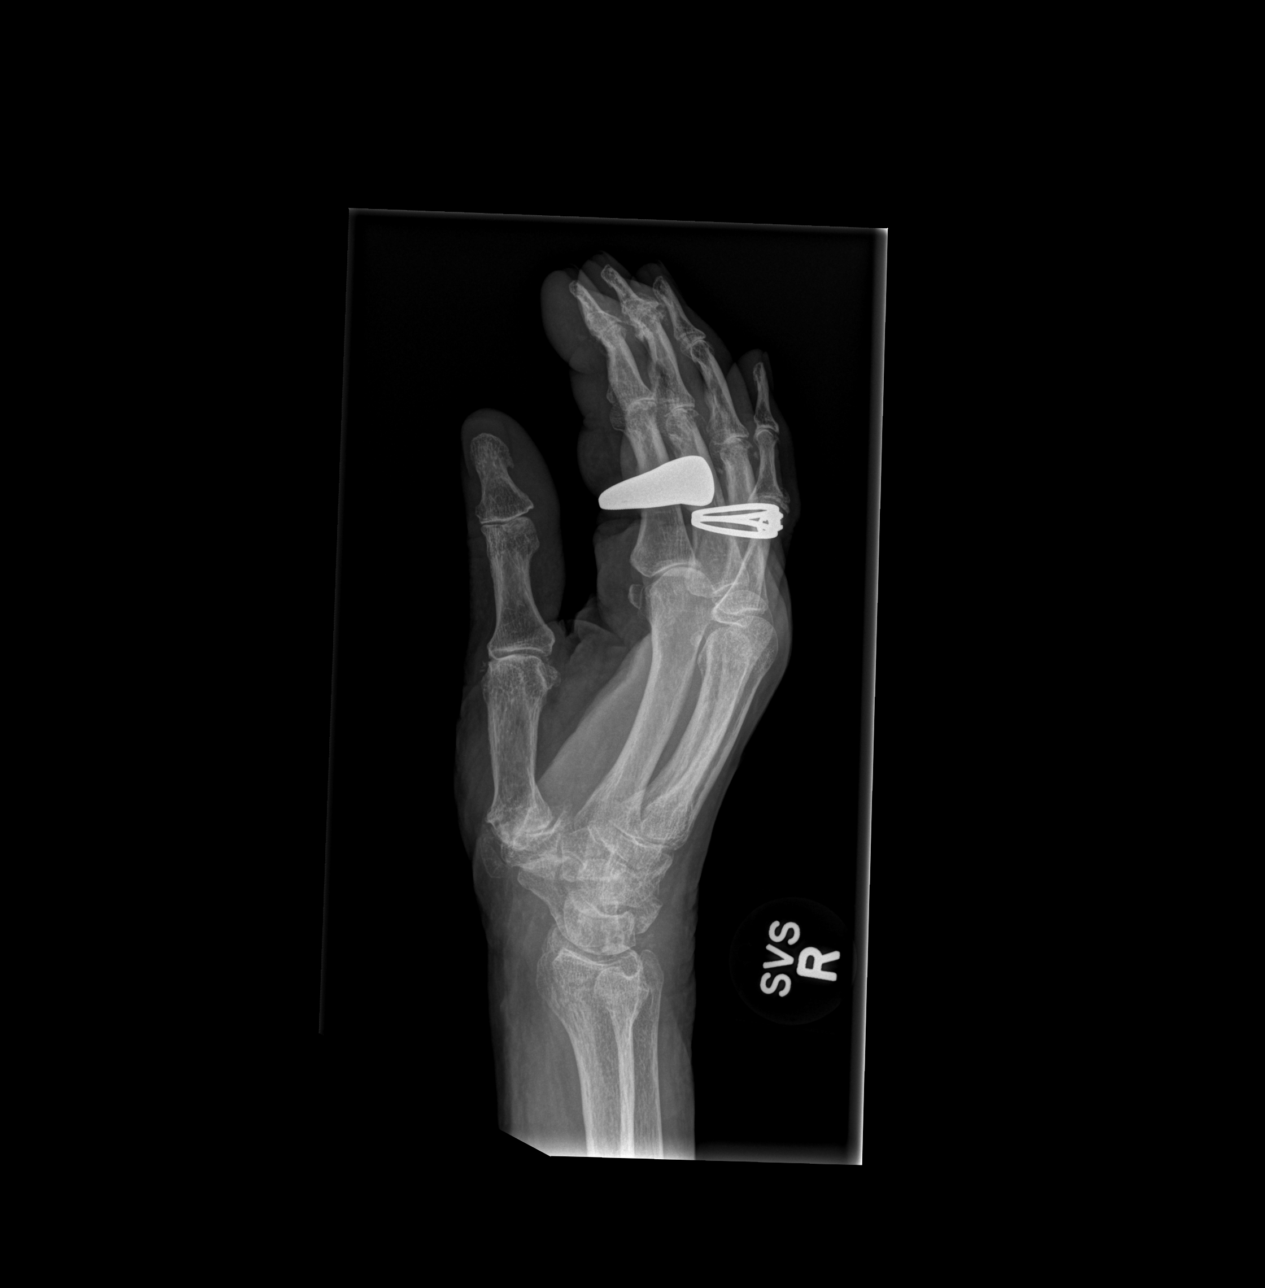

[3 of 3 positions shown; findings below may reference images not displayed]

FINDINGS: Diffuse osteopenia. There are some vague linear lucencies in the
distal metaphysis and epiphysis of the radius. These are less
prominent on the wrist radiographs, compatible with changes related
to the patient's osteopenia. Distal radiocarpal joint spur formation
is noted. There is also ligament calcification and radial
compartment wrist degenerative changes, including the first
metacarpal/carpal joint. Degenerative changes are also noted
involving multiple interphalangeal joints and the first and second
MCP joints. There is a large cyst within the lunate.
IMPRESSION: 1. Diffuse osteopenia with no definite fracture and no dislocation.
2. Chondrocalcinosis and degenerative changes.

## 2017-10-09 DIAGNOSIS — D485 Neoplasm of uncertain behavior of skin: Secondary | ICD-10-CM | POA: Diagnosis not present

## 2017-10-09 DIAGNOSIS — L570 Actinic keratosis: Secondary | ICD-10-CM | POA: Diagnosis not present

## 2017-10-09 DIAGNOSIS — L821 Other seborrheic keratosis: Secondary | ICD-10-CM | POA: Diagnosis not present

## 2017-10-09 DIAGNOSIS — D225 Melanocytic nevi of trunk: Secondary | ICD-10-CM | POA: Diagnosis not present

## 2017-10-10 ENCOUNTER — Ambulatory Visit (HOSPITAL_COMMUNITY)
Admission: RE | Admit: 2017-10-10 | Discharge: 2017-10-10 | Disposition: A | Discharge status: Home / Self Care | Payer: Medicare Other | Source: Ambulatory Visit | Attending: Cardiovascular Disease | Admitting: Cardiovascular Disease

## 2017-10-10 DIAGNOSIS — Z95820 Peripheral vascular angioplasty status with implants and grafts: Secondary | ICD-10-CM | POA: Insufficient documentation

## 2017-10-10 DIAGNOSIS — R9439 Abnormal result of other cardiovascular function study: Secondary | ICD-10-CM | POA: Diagnosis not present

## 2017-10-10 DIAGNOSIS — I739 Peripheral vascular disease, unspecified: Secondary | ICD-10-CM | POA: Insufficient documentation

## 2017-10-22 ENCOUNTER — Other Ambulatory Visit: Payer: Self-pay | Admitting: Cardiovascular Disease

## 2017-10-22 DIAGNOSIS — I739 Peripheral vascular disease, unspecified: Secondary | ICD-10-CM

## 2017-12-06 DIAGNOSIS — L570 Actinic keratosis: Secondary | ICD-10-CM | POA: Diagnosis not present

## 2017-12-24 DIAGNOSIS — M19019 Primary osteoarthritis, unspecified shoulder: Secondary | ICD-10-CM | POA: Diagnosis not present

## 2017-12-24 DIAGNOSIS — M25512 Pain in left shoulder: Secondary | ICD-10-CM | POA: Diagnosis not present

## 2017-12-24 DIAGNOSIS — M19011 Primary osteoarthritis, right shoulder: Secondary | ICD-10-CM | POA: Diagnosis not present

## 2017-12-24 DIAGNOSIS — M25511 Pain in right shoulder: Secondary | ICD-10-CM | POA: Diagnosis not present

## 2017-12-24 DIAGNOSIS — M19012 Primary osteoarthritis, left shoulder: Secondary | ICD-10-CM | POA: Diagnosis not present

## 2017-12-26 DIAGNOSIS — I739 Peripheral vascular disease, unspecified: Secondary | ICD-10-CM | POA: Diagnosis not present

## 2017-12-26 DIAGNOSIS — N183 Chronic kidney disease, stage 3 (moderate): Secondary | ICD-10-CM | POA: Diagnosis not present

## 2017-12-26 DIAGNOSIS — M150 Primary generalized (osteo)arthritis: Secondary | ICD-10-CM | POA: Diagnosis not present

## 2017-12-26 DIAGNOSIS — I10 Essential (primary) hypertension: Secondary | ICD-10-CM | POA: Diagnosis not present

## 2017-12-26 DIAGNOSIS — E039 Hypothyroidism, unspecified: Secondary | ICD-10-CM | POA: Diagnosis not present

## 2017-12-26 DIAGNOSIS — F419 Anxiety disorder, unspecified: Secondary | ICD-10-CM | POA: Diagnosis not present

## 2017-12-26 DIAGNOSIS — K219 Gastro-esophageal reflux disease without esophagitis: Secondary | ICD-10-CM | POA: Diagnosis not present

## 2018-01-09 DIAGNOSIS — M19019 Primary osteoarthritis, unspecified shoulder: Secondary | ICD-10-CM | POA: Diagnosis not present

## 2018-01-09 DIAGNOSIS — M25511 Pain in right shoulder: Secondary | ICD-10-CM | POA: Diagnosis not present

## 2018-01-09 DIAGNOSIS — M25512 Pain in left shoulder: Secondary | ICD-10-CM | POA: Diagnosis not present

## 2018-04-14 ENCOUNTER — Other Ambulatory Visit: Payer: Self-pay

## 2018-04-14 ENCOUNTER — Emergency Department (EMERGENCY_DEPARTMENT_HOSPITAL): Payer: Medicare Other

## 2018-04-14 ENCOUNTER — Emergency Department
Admission: EM | Admit: 2018-04-14 | Discharge: 2018-04-14 | Disposition: A | Discharge status: Home / Self Care | Payer: Medicare Other | Attending: Emergency Medicine | Admitting: Emergency Medicine

## 2018-04-14 ENCOUNTER — Emergency Department (EMERGENCY_DEPARTMENT_HOSPITAL): Payer: Medicare Other | Admitting: UHP RADIOLOGY

## 2018-04-14 DIAGNOSIS — S81811A Laceration without foreign body, right lower leg, initial encounter: Principal | ICD-10-CM | POA: Insufficient documentation

## 2018-04-14 DIAGNOSIS — M7989 Other specified soft tissue disorders: Secondary | ICD-10-CM | POA: Insufficient documentation

## 2018-04-14 DIAGNOSIS — E785 Hyperlipidemia, unspecified: Secondary | ICD-10-CM | POA: Insufficient documentation

## 2018-04-14 DIAGNOSIS — R600 Localized edema: Secondary | ICD-10-CM | POA: Insufficient documentation

## 2018-04-14 DIAGNOSIS — W458XXA Other foreign body or object entering through skin, initial encounter: Secondary | ICD-10-CM | POA: Insufficient documentation

## 2018-04-14 DIAGNOSIS — Z79899 Other long term (current) drug therapy: Secondary | ICD-10-CM | POA: Insufficient documentation

## 2018-04-14 DIAGNOSIS — R0602 Shortness of breath: Secondary | ICD-10-CM

## 2018-04-14 DIAGNOSIS — Z7902 Long term (current) use of antithrombotics/antiplatelets: Secondary | ICD-10-CM | POA: Insufficient documentation

## 2018-04-14 DIAGNOSIS — Z79890 Hormone replacement therapy (postmenopausal): Secondary | ICD-10-CM | POA: Insufficient documentation

## 2018-04-14 DIAGNOSIS — R609 Edema, unspecified: Secondary | ICD-10-CM

## 2018-04-14 DIAGNOSIS — Z7982 Long term (current) use of aspirin: Secondary | ICD-10-CM | POA: Insufficient documentation

## 2018-04-14 DIAGNOSIS — G4700 Insomnia, unspecified: Secondary | ICD-10-CM | POA: Insufficient documentation

## 2018-04-14 DIAGNOSIS — W228XXA Striking against or struck by other objects, initial encounter: Secondary | ICD-10-CM | POA: Diagnosis not present

## 2018-04-14 DIAGNOSIS — M79606 Pain in leg, unspecified: Secondary | ICD-10-CM | POA: Diagnosis not present

## 2018-04-14 HISTORY — DX: Hyperlipidemia, unspecified: E78.5

## 2018-04-14 LAB — PTT (PARTIAL THROMBOPLASTIN TIME): APTT: 27.4 s (ref 25.0–36.8)

## 2018-04-14 LAB — CBC WITH DIFF
BASOPHIL #: 0 x10ˆ3/uL (ref 0.00–0.10)
BASOPHIL %: 1 % (ref 0–3)
EOSINOPHIL #: 0.3 x10ˆ3/uL (ref 0.00–0.50)
EOSINOPHIL %: 3 % (ref 0–5)
HCT: 40.3 % (ref 36.0–45.0)
HGB: 13.8 g/dL (ref 12.0–15.5)
LYMPHOCYTE #: 2.9 10*3/uL (ref 1.00–4.80)
LYMPHOCYTE #: 2.9 x10ˆ3/uL (ref 1.00–4.80)
LYMPHOCYTE %: 32 % (ref 15–43)
MCH: 31.9 pg (ref 27.5–33.2)
MCHC: 34.3 g/dL (ref 32.0–36.0)
MCV: 93 fL (ref 82.0–97.0)
MONOCYTE #: 0.6 x10ˆ3/uL (ref 0.20–0.90)
MONOCYTE %: 6 % (ref 5–12)
MPV: 8.5 fL (ref 7.4–10.5)
NEUTROPHIL #: 5.3 x10ˆ3/uL (ref 1.50–6.50)
NEUTROPHIL %: 58 % (ref 43–76)
PLATELETS: 311 x10ˆ3/uL (ref 150–450)
RBC: 4.34 10*6/uL (ref 4.00–5.10)
RDW: 13.8 % (ref 11.0–16.0)
WBC: 9.1 10*3/uL (ref 4.0–11.0)

## 2018-04-14 LAB — COMPREHENSIVE METABOLIC PROFILE - BMC/JMC ONLY
ALBUMIN/GLOBULIN RATIO: 1.6 (ref 0.8–2.0)
ALBUMIN: 4.4 g/dL (ref 3.5–5.0)
ALKALINE PHOSPHATASE: 70 U/L (ref 38–126)
ALT (SGPT): 19 U/L (ref 14–54)
ANION GAP: 12 mmol/L — ABNORMAL HIGH (ref 3–11)
AST (SGOT): 27 U/L (ref 15–41)
BILIRUBIN TOTAL: 0.9 mg/dL (ref 0.3–1.2)
BUN/CREA RATIO: 17 (ref 6–22)
BUN: 18 mg/dL (ref 6–20)
CALCIUM: 9.2 mg/dL (ref 8.8–10.2)
CHLORIDE: 90 mmol/L — ABNORMAL LOW (ref 101–111)
CO2 TOTAL: 28 mmol/L (ref 22–32)
CREATININE: 1.03 mg/dL — ABNORMAL HIGH (ref 0.44–1.00)
ESTIMATED GFR: 50 mL/min/1.73mˆ2 — ABNORMAL LOW (ref >60)
GLUCOSE: 113 mg/dL — ABNORMAL HIGH (ref 70–110)
POTASSIUM: 3.2 mmol/L — ABNORMAL LOW (ref 3.4–5.1)
POTASSIUM: 3.2 mmol/L — ABNORMAL LOW (ref 3.4–5.1)
PROTEIN TOTAL: 7.1 g/dL (ref 6.4–8.3)
SODIUM: 130 mmol/L — ABNORMAL LOW (ref 136–145)

## 2018-04-14 LAB — B-TYPE NATRIURETIC PEPTIDE (BNP),PLASMA: BNP: 72 pg/mL (ref 0–100)

## 2018-04-14 LAB — PT/INR
INR: 0.97
PROTHROMBIN TIME: 10.8 s (ref 9.2–12.3)

## 2018-04-14 MED ORDER — ONDANSETRON HCL (PF) 4 MG/2 ML INJECTION SOLUTION
4.0000 mg | INTRAMUSCULAR | Status: AC
Start: 2018-04-14 — End: 2018-04-14
  Administered 2018-04-14: 4 mg via INTRAVENOUS
  Filled 2018-04-14: qty 2

## 2018-04-14 MED ORDER — SODIUM CHLORIDE 0.9 % (FLUSH) INJECTION SYRINGE
10.0000 mL | INJECTION | INTRAMUSCULAR | Status: DC | PRN
Start: 2018-04-14 — End: 2018-04-14

## 2018-04-14 MED ORDER — CEPHALEXIN 500 MG CAPSULE
500.00 mg | ORAL_CAPSULE | Freq: Three times a day (TID) | ORAL | 0 refills | Status: AC
Start: 2018-04-14 — End: 2018-04-21

## 2018-04-14 MED ORDER — CEPHALEXIN 500 MG CAPSULE
500.0000 mg | ORAL_CAPSULE | ORAL | Status: AC
Start: 2018-04-14 — End: 2018-04-14
  Administered 2018-04-14: 500 mg via ORAL
  Filled 2018-04-14: qty 1

## 2018-04-14 MED ORDER — SODIUM CHLORIDE 0.9 % (FLUSH) INJECTION SYRINGE
2.5000 mL | INJECTION | Freq: Three times a day (TID) | INTRAMUSCULAR | Status: DC
Start: 2018-04-14 — End: 2018-04-14

## 2018-04-14 MED ORDER — MORPHINE 4 MG/ML INTRAVENOUS SOLUTION
4.0000 mg | INTRAVENOUS | Status: AC
Start: 2018-04-14 — End: 2018-04-14
  Administered 2018-04-14: 4 mg via INTRAVENOUS
  Filled 2018-04-14: qty 1

## 2018-04-14 MED ADMIN — lactated Ringers intravenous solution: INTRAVENOUS | @ 02:00:00 | NDC 00338011704

## 2018-04-14 MED ADMIN — sodium chloride 0.9 % intravenous solution: INTRAVENOUS | @ 02:00:00 | NDC 00338004904

## 2018-04-14 NOTE — ED Nurses Note (Signed)
Pt here with family at bedside  States she went to step into a [employer], which had a step a little too high for her  Pt has large skin tear to L lower leg  Bleeding controlled    Family states pt was taken to hotel room where she c/o being nauseous  Pt then experienced an unresponsive episode where she was awake but didn't track her eyes or respond to family

## 2018-04-14 NOTE — ED Triage Notes (Addendum)
Pt arrives via [place] ([facility] [place] with a 6 inch skin tear to left shin that took place [place]. Pt is currently on blood thinner (Plavix). Pt also reports her ankles have been swelling recently. Large amount of bruising to left shin. Bleeding controlled on arrival with abd pad.

## 2018-04-14 NOTE — ED Nurses Note (Signed)
Unable to approximate skin edges to skin tear on left lower leg. Area irrigated and cleansed with ns, bacitracin applied, covered with telfa and wrapped with kling. Provider aware. Pt tolerated without complaint. Family at bedside.

## 2018-04-14 NOTE — ED Provider Notes (Signed)
[facility]  Emergency Department     HISTORY OF PRESENT ILLNESS     Date:  04/14/2018  Patient's Name:  Latasha Armstrong  Date of Birth:  09/28/1919    HPI   84 yo female [place] [facility] [place] [facility] [place] hitting her left shin on the railing of a transport van resulting in a large skin tear. Pt had one episode of vomiting PTA. Pt takes Plavix. Pt states lower ext swelling for several weeks. Denies shortness of breath.     Review of Systems     Review of Systems   Cardiovascular: Positive for leg swelling.   Gastrointestinal: Positive for vomiting.   All other systems reviewed and are negative.      Previous History     Past Medical History:  Past Medical History:   Diagnosis Date   . Hyperlipidemia        Past Surgical History:  Past Surgical History:   Procedure Laterality Date   . Cataract extraction w/ intraocular lens implant     . Endoprosthesis hip     . Hx stenting (any)         Social History:  Social History     Tobacco Use   . Smoking status: Never Smoker   . Smokeless tobacco: Never Used   Substance Use Topics   . Alcohol use: Not on file   . Drug use: Not on file     Social History     Substance and Sexual Activity   Drug Use Not on file       Family History:  No family history on file.    Medication History:  Current Outpatient Medications   Medication Sig   . ALPRAZolam (XANAX) 0.5 mg Oral Tablet Take 0.25 mg by mouth Twice per day as needed for Insomnia 0.25-0.5mg  by mouth two times daily as needed   . amLODIPine (NORVASC) 5 mg Oral Tablet Take 5 mg by mouth Once a day   . aspirin 81 mg Oral Tablet, Chewable Take 81 mg by mouth Once a day   . calcium carbonate 600 mg calcium (1,500 mg) Oral Tablet Take 600 mg by mouth Once a day   . cephalexin (KEFLEX) 500 mg Oral Capsule Take 1 Cap (500 mg total) by mouth Three times a day for 7 days   . clopidogrel (PLAVIX) 75 mg Oral Tablet Take 75 mg by mouth Once a day   . glucosamine/chondr su A sod (OSTEO BI-FLEX  ORAL) Take by mouth   . hydroCHLOROthiazide (HYDRODIURIL) 25 mg Oral Tablet Take 25 mg by mouth Once a day   . HYDROcodone-acetaminophen (NORCO) 5-325 mg Oral Tablet Take 1 Tab by mouth Every 4 hours as needed for Pain   . Lactobacillus Acidophilus (ACIDOPHILUS) Oral Tablet, Chewable Take by mouth Once a day   . levothyroxine (SYNTHROID) 75 mcg Oral Tablet Take 75 mcg by mouth Every morning   . omega-3 fatty acids (FISH OIL ORAL) Take by mouth   . OMEPRAZOLE ORAL Take 20 mg by mouth Once a day   . polycarbophil calcium (FIBERCON) 625 mg Oral Tablet Take 1,250 mg by mouth Four times a day   . pravastatin (PRAVACHOL) 40 mg Oral Tablet Take 40 mg by mouth Every evening       Allergies:  Allergies   Allergen Reactions   . Neosporin [Neomycin] Rash   . Niacin    . Tetanus Vaccines And Toxoid  Pt reports "lock jaw"       Physical Exam     Vitals:    BP 126/69   Pulse 66   Temp 36.8 C (98.3 F)   Resp 16   Ht 1.702 m (5\' 7" )   Wt 68.9 kg (152 lb)   SpO2 94%   BMI 23.81 kg/m         Physical Exam   Constitutional: She is oriented to person, place, and time. She appears well-developed and well-nourished. No distress.   HENT:   Head: Normocephalic and atraumatic.   Nose: Nose normal.   Mouth/Throat: Oropharynx is clear and moist.   Eyes: Conjunctivae and EOM are normal.   Neck: Normal range of motion. Neck supple.   Cardiovascular: Normal rate and regular rhythm.   Pulmonary/Chest: Effort normal and breath sounds normal.   Abdominal: Soft. Bowel sounds are normal.   Musculoskeletal: Normal range of motion.   Pain on palpation of left anterior shin. Left DP pulse 2+.    Neurological: She is alert and oriented to person, place, and time.   Skin: She is not diaphoretic.   Large skin tear left anterior chin without active bleeding.    Psychiatric: She has a normal mood and affect.   Nursing note and vitals reviewed.      Diagnostic Studies/Treatment     Medications:  Medications   ondansetron (ZOFRAN) 2 mg/mL  injection (4 mg Intravenous Given 04/14/18 0148)   morphine 4 mg/mL injection (4 mg Intravenous Given 04/14/18 0148)   cephalexin (KEFLEX) capsule (500 mg Oral Given 04/14/18 0247)       Discharge Medication List as of 04/14/2018  2:37 AM      START taking these medications    Details   cephalexin (KEFLEX) 500 mg Oral Capsule Take 1 Cap (500 mg total) by mouth Three times a day for 7 days, Disp-21 Cap, R-0, E-Rx             Labs:    Results for orders placed or performed during the hospital encounter of 04/14/18   COMPREHENSIVE METABOLIC PROFILE - [facility] ONLY   Result Value Ref Range    SODIUM 130 (L) 136 - 145 mmol/L    POTASSIUM 3.2 (L) 3.4 - 5.1 mmol/L    CHLORIDE 90 (L) 101 - 111 mmol/L    CO2 TOTAL 28 22 - 32 mmol/L    ANION GAP 12 (H) 3 - 11 mmol/L    BUN 18 6 - 20 mg/dL    CREATININE 1.47 (H) 0.44 - 1.00 mg/dL    BUN/CREA RATIO 17 6 - 22    ESTIMATED GFR 50 (L) >60 mL/min/1.25m^2    ALBUMIN 4.4 3.5 - 5.0 g/dL    CALCIUM 9.2 8.8 - 82.9 mg/dL    GLUCOSE 562 (H) 70 - 110 mg/dL    ALKALINE PHOSPHATASE 70 38 - 126 U/L    ALT (SGPT) 19 14 - 54 U/L    AST (SGOT) 27 15 - 41 U/L    BILIRUBIN TOTAL 0.9 0.3 - 1.2 mg/dL    PROTEIN TOTAL 7.1 6.4 - 8.3 g/dL    ALBUMIN/GLOBULIN RATIO 1.6 0.8 - 2.0   PT/INR   Result Value Ref Range    PROTHROMBIN TIME 10.8 9.2 - 12.3 seconds    INR 0.97    PTT (PARTIAL THROMBOPLASTIN TIME)   Result Value Ref Range    APTT 27.4 25.0 - 36.8 seconds   B-TYPE NATRIURETIC PEPTIDE   Result Value  Ref Range    BNP 72 0 - 100 pg/mL   CBC WITH DIFF   Result Value Ref Range    WBC 9.1 4.0 - 11.0 x10^3/uL    RBC 4.34 4.00 - 5.10 x10^6/uL    HGB 13.8 12.0 - 15.5 g/dL    HCT 14.740.3 82.936.0 - 56.245.0 %    MCV 93.0 82.0 - 97.0 fL    MCH 31.9 27.5 - 33.2 pg    MCHC 34.3 32.0 - 36.0 g/dL    RDW 13.013.8 86.511.0 - 78.416.0 %    PLATELETS 311 150 - 450 x10^3/uL    MPV 8.5 7.4 - 10.5 fL    NEUTROPHIL % 58 43 - 76 %    LYMPHOCYTE % 32 15 - 43 %    MONOCYTE % 6 5 - 12 %    EOSINOPHIL % 3 0 - 5 %    BASOPHIL % 1 0 - 3 %    NEUTROPHIL #  5.30 1.50 - 6.50 x10^3/uL    LYMPHOCYTE # 2.90 1.00 - 4.80 x10^3/uL    MONOCYTE # 0.60 0.20 - 0.90 x10^3/uL    EOSINOPHIL # 0.30 0.00 - 0.50 x10^3/uL    BASOPHIL # 0.00 0.00 - 0.10 x10^3/uL       Radiology:  XR TIBIA-FIBULA LEFT  XR CHEST AP PORTABLE    XR CHEST AP PORTABLE   Final Result   No evidence of acute cardiopulmonary disease.            Radiologist location ID: [id number]         XR TIBIA-FIBULA LEFT   Final Result   Nonspecific soft tissue edema with no evidence of acute fracture.            Radiologist location ID: [id number]             ECG:  NONE      Procedure     Procedures    Course/Disposition/Plan     Course:  No evidence of CHF on work up today. LE swelling appears to be secondary to peripheral edema. Pt has a large skin tear that could not be sutured. The skin tear was cleaned and steri strips were placed. Will start Keflex given size of wound although wound is clean. Pt and family have been informed that they will need to follow up with PMD to make appt with wound clinic when they return home.         Disposition:    Discharged    Condition at Disposition:   Stable      Follow up:     Follow up with your doctor in 2-3 days.          Clinical Impression:     Encounter Diagnoses   Name Primary?   Marland Kitchen. Noninfected skin tear of right leg, initial encounter Yes   . Peripheral edema        Future Appointments Scheduled in Epic:  No future appointments.

## 2018-04-14 NOTE — ED Nurses Note (Signed)
Patient discharged home with family.  AVS reviewed with patient/care giver.  A written copy of the AVS and discharge instructions was given to the patient/care giver.  Questions sufficiently answered as needed.  Patient/care giver encouraged to follow up with PCP as indicated.  In the event of an emergency, patient/care giver instructed to call 911 or go to the nearest emergency room.

## 2018-04-14 NOTE — ED Nurses Note (Signed)
Radiology at bedside for xrays. 

## 2018-04-16 DIAGNOSIS — F419 Anxiety disorder, unspecified: Secondary | ICD-10-CM | POA: Diagnosis not present

## 2018-04-16 DIAGNOSIS — K5900 Constipation, unspecified: Secondary | ICD-10-CM | POA: Diagnosis not present

## 2018-04-16 DIAGNOSIS — S81812A Laceration without foreign body, left lower leg, initial encounter: Secondary | ICD-10-CM | POA: Diagnosis not present

## 2018-04-23 DIAGNOSIS — S81812D Laceration without foreign body, left lower leg, subsequent encounter: Secondary | ICD-10-CM | POA: Diagnosis not present

## 2018-04-26 DIAGNOSIS — F419 Anxiety disorder, unspecified: Secondary | ICD-10-CM | POA: Diagnosis not present

## 2018-04-26 DIAGNOSIS — Z7982 Long term (current) use of aspirin: Secondary | ICD-10-CM | POA: Diagnosis not present

## 2018-04-26 DIAGNOSIS — M150 Primary generalized (osteo)arthritis: Secondary | ICD-10-CM | POA: Diagnosis not present

## 2018-04-26 DIAGNOSIS — N183 Chronic kidney disease, stage 3 (moderate): Secondary | ICD-10-CM | POA: Diagnosis not present

## 2018-04-26 DIAGNOSIS — Z79891 Long term (current) use of opiate analgesic: Secondary | ICD-10-CM | POA: Diagnosis not present

## 2018-04-26 DIAGNOSIS — I129 Hypertensive chronic kidney disease with stage 1 through stage 4 chronic kidney disease, or unspecified chronic kidney disease: Secondary | ICD-10-CM | POA: Diagnosis not present

## 2018-04-26 DIAGNOSIS — I739 Peripheral vascular disease, unspecified: Secondary | ICD-10-CM | POA: Diagnosis not present

## 2018-04-26 DIAGNOSIS — S81812D Laceration without foreign body, left lower leg, subsequent encounter: Secondary | ICD-10-CM | POA: Diagnosis not present

## 2018-04-26 DIAGNOSIS — E039 Hypothyroidism, unspecified: Secondary | ICD-10-CM | POA: Diagnosis not present

## 2018-04-26 DIAGNOSIS — Z7902 Long term (current) use of antithrombotics/antiplatelets: Secondary | ICD-10-CM | POA: Diagnosis not present

## 2018-04-30 DIAGNOSIS — S81812D Laceration without foreign body, left lower leg, subsequent encounter: Secondary | ICD-10-CM | POA: Diagnosis not present

## 2018-04-30 DIAGNOSIS — N183 Chronic kidney disease, stage 3 (moderate): Secondary | ICD-10-CM | POA: Diagnosis not present

## 2018-04-30 DIAGNOSIS — I739 Peripheral vascular disease, unspecified: Secondary | ICD-10-CM | POA: Diagnosis not present

## 2018-04-30 DIAGNOSIS — F419 Anxiety disorder, unspecified: Secondary | ICD-10-CM | POA: Diagnosis not present

## 2018-04-30 DIAGNOSIS — I129 Hypertensive chronic kidney disease with stage 1 through stage 4 chronic kidney disease, or unspecified chronic kidney disease: Secondary | ICD-10-CM | POA: Diagnosis not present

## 2018-04-30 DIAGNOSIS — M150 Primary generalized (osteo)arthritis: Secondary | ICD-10-CM | POA: Diagnosis not present

## 2018-05-01 DIAGNOSIS — F419 Anxiety disorder, unspecified: Secondary | ICD-10-CM | POA: Diagnosis not present

## 2018-05-01 DIAGNOSIS — S81812D Laceration without foreign body, left lower leg, subsequent encounter: Secondary | ICD-10-CM | POA: Diagnosis not present

## 2018-05-01 DIAGNOSIS — M150 Primary generalized (osteo)arthritis: Secondary | ICD-10-CM | POA: Diagnosis not present

## 2018-05-01 DIAGNOSIS — I739 Peripheral vascular disease, unspecified: Secondary | ICD-10-CM | POA: Diagnosis not present

## 2018-05-01 DIAGNOSIS — N183 Chronic kidney disease, stage 3 (moderate): Secondary | ICD-10-CM | POA: Diagnosis not present

## 2018-05-01 DIAGNOSIS — I129 Hypertensive chronic kidney disease with stage 1 through stage 4 chronic kidney disease, or unspecified chronic kidney disease: Secondary | ICD-10-CM | POA: Diagnosis not present

## 2018-05-03 DIAGNOSIS — I129 Hypertensive chronic kidney disease with stage 1 through stage 4 chronic kidney disease, or unspecified chronic kidney disease: Secondary | ICD-10-CM | POA: Diagnosis not present

## 2018-05-03 DIAGNOSIS — S81812D Laceration without foreign body, left lower leg, subsequent encounter: Secondary | ICD-10-CM | POA: Diagnosis not present

## 2018-05-03 DIAGNOSIS — I739 Peripheral vascular disease, unspecified: Secondary | ICD-10-CM | POA: Diagnosis not present

## 2018-05-03 DIAGNOSIS — F419 Anxiety disorder, unspecified: Secondary | ICD-10-CM | POA: Diagnosis not present

## 2018-05-03 DIAGNOSIS — M150 Primary generalized (osteo)arthritis: Secondary | ICD-10-CM | POA: Diagnosis not present

## 2018-05-03 DIAGNOSIS — N183 Chronic kidney disease, stage 3 (moderate): Secondary | ICD-10-CM | POA: Diagnosis not present

## 2018-05-06 DIAGNOSIS — S81812D Laceration without foreign body, left lower leg, subsequent encounter: Secondary | ICD-10-CM | POA: Diagnosis not present

## 2018-05-07 DIAGNOSIS — M150 Primary generalized (osteo)arthritis: Secondary | ICD-10-CM | POA: Diagnosis not present

## 2018-05-07 DIAGNOSIS — F419 Anxiety disorder, unspecified: Secondary | ICD-10-CM | POA: Diagnosis not present

## 2018-05-07 DIAGNOSIS — S81812D Laceration without foreign body, left lower leg, subsequent encounter: Secondary | ICD-10-CM | POA: Diagnosis not present

## 2018-05-07 DIAGNOSIS — I129 Hypertensive chronic kidney disease with stage 1 through stage 4 chronic kidney disease, or unspecified chronic kidney disease: Secondary | ICD-10-CM | POA: Diagnosis not present

## 2018-05-07 DIAGNOSIS — I739 Peripheral vascular disease, unspecified: Secondary | ICD-10-CM | POA: Diagnosis not present

## 2018-05-07 DIAGNOSIS — N183 Chronic kidney disease, stage 3 (moderate): Secondary | ICD-10-CM | POA: Diagnosis not present

## 2018-05-10 DIAGNOSIS — M150 Primary generalized (osteo)arthritis: Secondary | ICD-10-CM | POA: Diagnosis not present

## 2018-05-10 DIAGNOSIS — F419 Anxiety disorder, unspecified: Secondary | ICD-10-CM | POA: Diagnosis not present

## 2018-05-10 DIAGNOSIS — I739 Peripheral vascular disease, unspecified: Secondary | ICD-10-CM | POA: Diagnosis not present

## 2018-05-10 DIAGNOSIS — I129 Hypertensive chronic kidney disease with stage 1 through stage 4 chronic kidney disease, or unspecified chronic kidney disease: Secondary | ICD-10-CM | POA: Diagnosis not present

## 2018-05-10 DIAGNOSIS — S81812D Laceration without foreign body, left lower leg, subsequent encounter: Secondary | ICD-10-CM | POA: Diagnosis not present

## 2018-05-10 DIAGNOSIS — N183 Chronic kidney disease, stage 3 (moderate): Secondary | ICD-10-CM | POA: Diagnosis not present

## 2018-05-14 DIAGNOSIS — S81812D Laceration without foreign body, left lower leg, subsequent encounter: Secondary | ICD-10-CM | POA: Diagnosis not present

## 2018-05-14 DIAGNOSIS — F419 Anxiety disorder, unspecified: Secondary | ICD-10-CM | POA: Diagnosis not present

## 2018-05-14 DIAGNOSIS — I739 Peripheral vascular disease, unspecified: Secondary | ICD-10-CM | POA: Diagnosis not present

## 2018-05-14 DIAGNOSIS — I129 Hypertensive chronic kidney disease with stage 1 through stage 4 chronic kidney disease, or unspecified chronic kidney disease: Secondary | ICD-10-CM | POA: Diagnosis not present

## 2018-05-14 DIAGNOSIS — M150 Primary generalized (osteo)arthritis: Secondary | ICD-10-CM | POA: Diagnosis not present

## 2018-05-14 DIAGNOSIS — N183 Chronic kidney disease, stage 3 (moderate): Secondary | ICD-10-CM | POA: Diagnosis not present

## 2018-05-24 DIAGNOSIS — N183 Chronic kidney disease, stage 3 (moderate): Secondary | ICD-10-CM | POA: Diagnosis not present

## 2018-05-24 DIAGNOSIS — M150 Primary generalized (osteo)arthritis: Secondary | ICD-10-CM | POA: Diagnosis not present

## 2018-05-24 DIAGNOSIS — S81812D Laceration without foreign body, left lower leg, subsequent encounter: Secondary | ICD-10-CM | POA: Diagnosis not present

## 2018-05-24 DIAGNOSIS — F419 Anxiety disorder, unspecified: Secondary | ICD-10-CM | POA: Diagnosis not present

## 2018-05-24 DIAGNOSIS — I739 Peripheral vascular disease, unspecified: Secondary | ICD-10-CM | POA: Diagnosis not present

## 2018-05-24 DIAGNOSIS — I129 Hypertensive chronic kidney disease with stage 1 through stage 4 chronic kidney disease, or unspecified chronic kidney disease: Secondary | ICD-10-CM | POA: Diagnosis not present

## 2018-05-31 DIAGNOSIS — N183 Chronic kidney disease, stage 3 (moderate): Secondary | ICD-10-CM | POA: Diagnosis not present

## 2018-05-31 DIAGNOSIS — F419 Anxiety disorder, unspecified: Secondary | ICD-10-CM | POA: Diagnosis not present

## 2018-05-31 DIAGNOSIS — I739 Peripheral vascular disease, unspecified: Secondary | ICD-10-CM | POA: Diagnosis not present

## 2018-05-31 DIAGNOSIS — S81812D Laceration without foreign body, left lower leg, subsequent encounter: Secondary | ICD-10-CM | POA: Diagnosis not present

## 2018-05-31 DIAGNOSIS — I129 Hypertensive chronic kidney disease with stage 1 through stage 4 chronic kidney disease, or unspecified chronic kidney disease: Secondary | ICD-10-CM | POA: Diagnosis not present

## 2018-05-31 DIAGNOSIS — M150 Primary generalized (osteo)arthritis: Secondary | ICD-10-CM | POA: Diagnosis not present

## 2018-06-18 ENCOUNTER — Encounter: Payer: Self-pay | Admitting: Cardiovascular Disease

## 2018-06-18 ENCOUNTER — Ambulatory Visit (INDEPENDENT_AMBULATORY_CARE_PROVIDER_SITE_OTHER): Payer: Medicare Other | Admitting: Cardiovascular Disease

## 2018-06-18 DIAGNOSIS — I739 Peripheral vascular disease, unspecified: Secondary | ICD-10-CM

## 2018-06-18 DIAGNOSIS — I10 Essential (primary) hypertension: Secondary | ICD-10-CM | POA: Diagnosis not present

## 2018-06-18 DIAGNOSIS — E785 Hyperlipidemia, unspecified: Secondary | ICD-10-CM

## 2018-06-18 NOTE — Assessment & Plan Note (Signed)
History of peripheral arterial disease status post right SFA stenting with re-intervention in May 2015 with a Viabahn  covered stent at the leading edge with improvement in her Doppler studies.  She denies claudication.

## 2018-06-18 NOTE — Progress Notes (Signed)
06/18/2018 Jennifer Atkinson   Jun 14, 1920  [id number]  Primary Physician Alroy Dust, L.Marlou Sa, MD Primary Cardiologist: [name] MD FACP, [place], [place], [place]  HPI:  Jennifer Atkinson is a 84 y.o.  mildly overweight widowed Caucasian female, mother of 57 and grandmother to 53 grandchildren, accompanied by one of her daughters Jennifer Atkinson today. She was originally referred to me for claudication. I last saw her in the office  06/13/2017 .  I performed angiogram on her Jan 25, 2012, and revealed segmentally stenosed mid right SFA, which I stented resulting in improvement in her Dopplers; however, her symptoms did not significantly improve. She ultimately had back surgery, which resulted in marked improvement in her symptoms. Followup Dopplers performed December 02, 2012, showed that her stent was functionally occluded with a decrease in her ABIs from the 0.8 range to 0.43, though she denies claudication. In addition, she denies chest pain or shortness of breath. She did have a negative Myoview.  Since I saw her back in May she developed pain in her right foot. Some wan not clear whether this was ischemic versus orthopedic.I examined her on 04/08/13 revealing an occluded right SFA. I ultimately percutaneously revascularized her with overlapping bye-bye and covered stents. Preprocedure ABI was 0.95 and she had marked improvement in her claudication symptoms. Over the last several months she's noticed recurrent right calf claudication with decline in her ABI to .81. She's developed a new high-frequency signal at the origin of her right SFA. I re\re intervention on 01/05/14 revealing a high-grade lesion in the proximal right SFA at the leading edge of the previously placed stent. I re-stented this with a Viabahn covered stent with excellent angiographic, clinical and ultrasonographic result. She recently fell and injured her right shoulder and arm. She also complains of some right hip and thigh pain. Recent Dopplers  performed in the office 08/31/15 revealed a right ABI 1.1 with a new high-frequency signal at the previously stented origin of the right SFA. Dopplers performed 04/10/16 revealed a-T signal the origin of the right SFA with an ABI 0.87 which is down from 1.1. She'd also has a new high-frequency signal in her distal left SFA with a reduction in her left ABI down to 0.71 although she denies claudication. Her most recent Dopplers performed 10/09/16 revealed a right ABI of 0.87 with a widely patent stent in the left ABI 0.7.  Since I saw her a year ago she is remained stable.  She did have trauma to the anterior aspect of her left pretibial area this past summer which is slowly healed.  She denies chest pain, shortness of breath or claudication.      Allergies  Allergen Reactions  . Lipitor [Atorvastatin]     Pain in the back of her calves  . Morphine Other (See Comments)    Other Reaction: GI Upset  . Niacin Other (See Comments)    Other Reaction: Other reaction  . Nsaids     Increased Creatinine  . Oxycodone-Acetaminophen Other (See Comments)    Other Reaction: Other reaction    Social History   Socioeconomic History  . Marital status: Widowed    Spouse name: Not on file  . Number of children: Not on file  . Years of education: Not on file  . Highest education level: Not on file  Occupational History  . Not on file  Social Needs  . Financial resource strain: Not on file  . Food insecurity:    Worry: Not  on file    Inability: Not on file  . Transportation needs:    Medical: Not on file    Non-medical: Not on file  Tobacco Use  . Smoking status: Never Smoker  . Smokeless tobacco: Never Used  Substance and Sexual Activity  . Alcohol use: Yes    Alcohol/week: 4.0 standard drinks    Types: 4 Glasses of wine per week    Comment: socially  . Drug use: No  . Sexual activity: Never  Lifestyle  . Physical activity:    Days per week: Not on file    Minutes per session: Not on file    . Stress: Not on file  Relationships  . Social connections:    Talks on phone: Not on file    Gets together: Not on file    Attends religious service: Not on file    Active member of club or organization: Not on file    Attends meetings of clubs or organizations: Not on file    Relationship status: Not on file  . Intimate partner violence:    Fear of current or ex partner: Not on file    Emotionally abused: Not on file    Physically abused: Not on file    Forced sexual activity: Not on file  Other Topics Concern  . Not on file  Social History Narrative  . Not on file     Review of Systems: General: negative for chills, fever, night sweats or weight changes.  Cardiovascular: negative for chest pain, dyspnea on exertion, edema, orthopnea, palpitations, paroxysmal nocturnal dyspnea or shortness of breath Dermatological: negative for rash Respiratory: negative for cough or wheezing Urologic: negative for hematuria Abdominal: negative for nausea, vomiting, diarrhea, bright red blood per rectum, melena, or hematemesis Neurologic: negative for visual changes, syncope, or dizziness All other systems reviewed and are otherwise negative except as noted above.    Blood pressure 122/74, pulse 76, height 5\' 6"  (1.676 m), weight 162 lb (73.5 kg).  General appearance: alert and no distress Neck: no adenopathy, no carotid bruit, no JVD, supple, symmetrical, trachea midline and thyroid not enlarged, symmetric, no tenderness/mass/nodules Lungs: clear to auscultation bilaterally Heart: regular rate and rhythm, S1, S2 normal, no murmur, click, rub or gallop Extremities: extremities normal, atraumatic, no cyanosis or edema Pulses: 2+ and symmetric Skin: Skin color, texture, turgor normal. No rashes or lesions Neurologic: Alert and oriented X 3, normal strength and tone. Normal symmetric reflexes. Normal coordination and gait  EKG sinus rhythm at 76 with nonspecific ST and T wave changes. I   Personally reviewed this EKG  ASSESSMENT AND PLAN:   PVD, Rt SFA May 2013, Aug 2014, May 2015 (ISR) History of peripheral arterial disease status post right SFA stenting with re-intervention in May 2015 with a Viabahn  covered stent at the leading edge with improvement in her Doppler studies.  She denies claudication.  HTN (hypertension) History of essential hypertension her blood pressure measured at 122/74.  She is on amlodipine and hydrochlorothiazide.  Dyslipidemia History of dyslipidemia on pravastatin followed by her PCP      [name] MD [facility], [facility] 06/18/2018 11:52 AM

## 2018-06-18 NOTE — Assessment & Plan Note (Signed)
History of dyslipidemia on pravastatin followed by her PCP

## 2018-06-18 NOTE — Assessment & Plan Note (Signed)
History of essential hypertension her blood pressure measured at 122/74.  She is on amlodipine and hydrochlorothiazide.

## 2018-06-18 NOTE — Patient Instructions (Signed)
Medication Instructions:  Your physician recommends that you continue on your current medications as directed. Please refer to the Current Medication list given to you today.  If you need a refill on your cardiac medications before your next appointment, please call your pharmacy.   Lab work: none If you have labs (blood work) drawn today and your tests are completely normal, you will receive your results only by: . MyChart Message (if you have MyChart) OR . A paper copy in the mail If you have any lab test that is abnormal or we need to change your treatment, we will call you to review the results.  Testing/Procedures: none  Follow-Up: At [facility], you and your health needs are our priority.  As part of our continuing mission to provide you with exceptional heart care, we have created designated Provider Care Teams.  These Care Teams include your primary Cardiologist (physician) and Advanced Practice Providers (APPs -  Physician Assistants and Nurse Practitioners) who all work together to provide you with the care you need, when you need it. You will need a follow up appointment in 12 months.  Please call our office 2 months in advance to schedule this appointment.  You may see Dr. Berry or one of the following Advanced Practice Providers on your designated Care Team:   Luke Kilroy, PA-C Krista Kroeger, PA-C . Callie Goodrich, PA-C  Any Other Special Instructions Will Be Listed Below (If Applicable).    

## 2018-07-05 DIAGNOSIS — E039 Hypothyroidism, unspecified: Secondary | ICD-10-CM | POA: Diagnosis not present

## 2018-07-05 DIAGNOSIS — I739 Peripheral vascular disease, unspecified: Secondary | ICD-10-CM | POA: Diagnosis not present

## 2018-07-05 DIAGNOSIS — Z23 Encounter for immunization: Secondary | ICD-10-CM | POA: Diagnosis not present

## 2018-07-05 DIAGNOSIS — M150 Primary generalized (osteo)arthritis: Secondary | ICD-10-CM | POA: Diagnosis not present

## 2018-07-05 DIAGNOSIS — F419 Anxiety disorder, unspecified: Secondary | ICD-10-CM | POA: Diagnosis not present

## 2018-07-05 DIAGNOSIS — I10 Essential (primary) hypertension: Secondary | ICD-10-CM | POA: Diagnosis not present

## 2018-07-05 DIAGNOSIS — N183 Chronic kidney disease, stage 3 (moderate): Secondary | ICD-10-CM | POA: Diagnosis not present

## 2018-07-05 DIAGNOSIS — K219 Gastro-esophageal reflux disease without esophagitis: Secondary | ICD-10-CM | POA: Diagnosis not present

## 2018-07-16 ENCOUNTER — Ambulatory Visit
Admission: RE | Admit: 2018-07-16 | Discharge: 2018-07-16 | Disposition: A | Discharge status: Home / Self Care | Payer: Medicare Other | Source: Ambulatory Visit | Attending: Family Medicine | Admitting: Family Medicine

## 2018-07-16 ENCOUNTER — Other Ambulatory Visit: Payer: Self-pay | Admitting: Family Medicine

## 2018-07-16 DIAGNOSIS — S40021D Contusion of right upper arm, subsequent encounter: Secondary | ICD-10-CM | POA: Diagnosis not present

## 2018-07-16 DIAGNOSIS — R079 Chest pain, unspecified: Secondary | ICD-10-CM

## 2018-07-16 DIAGNOSIS — S299XXA Unspecified injury of thorax, initial encounter: Secondary | ICD-10-CM | POA: Diagnosis not present

## 2018-07-18 DIAGNOSIS — Z79891 Long term (current) use of opiate analgesic: Secondary | ICD-10-CM | POA: Diagnosis not present

## 2018-07-18 DIAGNOSIS — R0689 Other abnormalities of breathing: Secondary | ICD-10-CM | POA: Diagnosis not present

## 2018-07-18 DIAGNOSIS — M545 Low back pain: Secondary | ICD-10-CM | POA: Diagnosis not present

## 2018-08-02 ENCOUNTER — Other Ambulatory Visit: Payer: Self-pay | Admitting: Cardiovascular Disease

## 2018-10-07 DIAGNOSIS — T148XXA Other injury of unspecified body region, initial encounter: Secondary | ICD-10-CM | POA: Diagnosis not present

## 2018-11-21 ENCOUNTER — Other Ambulatory Visit: Payer: Self-pay | Admitting: Cardiovascular Disease

## 2019-02-12 DIAGNOSIS — M25512 Pain in left shoulder: Secondary | ICD-10-CM | POA: Diagnosis not present

## 2019-02-12 DIAGNOSIS — M25511 Pain in right shoulder: Secondary | ICD-10-CM | POA: Diagnosis not present

## 2019-02-12 DIAGNOSIS — M19019 Primary osteoarthritis, unspecified shoulder: Secondary | ICD-10-CM | POA: Diagnosis not present

## 2019-03-24 ENCOUNTER — Other Ambulatory Visit: Payer: Self-pay | Admitting: Cardiovascular Disease

## 2019-04-04 DIAGNOSIS — K219 Gastro-esophageal reflux disease without esophagitis: Secondary | ICD-10-CM | POA: Diagnosis not present

## 2019-04-04 DIAGNOSIS — F419 Anxiety disorder, unspecified: Secondary | ICD-10-CM | POA: Diagnosis not present

## 2019-04-04 DIAGNOSIS — E039 Hypothyroidism, unspecified: Secondary | ICD-10-CM | POA: Diagnosis not present

## 2019-04-04 DIAGNOSIS — I10 Essential (primary) hypertension: Secondary | ICD-10-CM | POA: Diagnosis not present

## 2019-04-04 DIAGNOSIS — I739 Peripheral vascular disease, unspecified: Secondary | ICD-10-CM | POA: Diagnosis not present

## 2019-04-04 DIAGNOSIS — N183 Chronic kidney disease, stage 3 (moderate): Secondary | ICD-10-CM | POA: Diagnosis not present

## 2019-04-04 DIAGNOSIS — M150 Primary generalized (osteo)arthritis: Secondary | ICD-10-CM | POA: Diagnosis not present

## 2019-04-14 DIAGNOSIS — N183 Chronic kidney disease, stage 3 (moderate): Secondary | ICD-10-CM | POA: Diagnosis not present

## 2019-04-24 DIAGNOSIS — Z23 Encounter for immunization: Secondary | ICD-10-CM | POA: Diagnosis not present

## 2019-05-08 DIAGNOSIS — H524 Presbyopia: Secondary | ICD-10-CM | POA: Diagnosis not present

## 2019-05-08 DIAGNOSIS — H26492 Other secondary cataract, left eye: Secondary | ICD-10-CM | POA: Diagnosis not present

## 2019-06-02 ENCOUNTER — Other Ambulatory Visit: Payer: Self-pay | Admitting: Cardiovascular Disease

## 2019-06-09 DIAGNOSIS — L986 Other infiltrative disorders of the skin and subcutaneous tissue: Secondary | ICD-10-CM | POA: Diagnosis not present

## 2019-06-09 DIAGNOSIS — D485 Neoplasm of uncertain behavior of skin: Secondary | ICD-10-CM | POA: Diagnosis not present

## 2019-06-09 DIAGNOSIS — L820 Inflamed seborrheic keratosis: Secondary | ICD-10-CM | POA: Diagnosis not present

## 2019-06-09 DIAGNOSIS — D0439 Carcinoma in situ of skin of other parts of face: Secondary | ICD-10-CM | POA: Diagnosis not present

## 2019-06-09 DIAGNOSIS — L570 Actinic keratosis: Secondary | ICD-10-CM | POA: Diagnosis not present

## 2019-06-09 DIAGNOSIS — L988 Other specified disorders of the skin and subcutaneous tissue: Secondary | ICD-10-CM | POA: Diagnosis not present

## 2019-06-12 DIAGNOSIS — H26491 Other secondary cataract, right eye: Secondary | ICD-10-CM | POA: Diagnosis not present

## 2019-07-21 ENCOUNTER — Other Ambulatory Visit: Payer: Self-pay | Admitting: Cardiovascular Disease

## 2019-08-25 ENCOUNTER — Other Ambulatory Visit: Payer: Self-pay | Admitting: Cardiovascular Disease

## 2019-08-28 ENCOUNTER — Other Ambulatory Visit: Payer: Self-pay | Admitting: Cardiovascular Disease

## 2019-08-30 ENCOUNTER — Other Ambulatory Visit: Payer: Self-pay | Admitting: Cardiovascular Disease

## 2019-09-03 ENCOUNTER — Other Ambulatory Visit: Payer: Self-pay | Admitting: Cardiovascular Disease

## 2019-09-04 NOTE — Telephone Encounter (Signed)
*  STAT* If patient is at the pharmacy, call can be transferred to refill team.   1. Which medications need to be refilled? (please list name of each medication and dose if known) clopidogrel (PLAVIX) 75 MG tablet  2. Which pharmacy/location (including street and city if local pharmacy) is medication to be sent to? [place], [place]  3. Do they need a 30 day or 90 day supply? 90 day

## 2019-09-05 MED ORDER — CLOPIDOGREL BISULFATE 75 MG PO TABS: 75.0000 mg | ORAL_TABLET | Freq: Every day | ORAL | 0 refills | Status: DC

## 2019-09-05 NOTE — Addendum Note (Signed)
Addended by: Venetia Maxon on: 09/05/2019 02:15 PM   Modules accepted: Orders

## 2019-10-07 DIAGNOSIS — I739 Peripheral vascular disease, unspecified: Secondary | ICD-10-CM | POA: Diagnosis not present

## 2019-10-07 DIAGNOSIS — I10 Essential (primary) hypertension: Secondary | ICD-10-CM | POA: Diagnosis not present

## 2019-10-07 DIAGNOSIS — F419 Anxiety disorder, unspecified: Secondary | ICD-10-CM | POA: Diagnosis not present

## 2019-10-07 DIAGNOSIS — H6120 Impacted cerumen, unspecified ear: Secondary | ICD-10-CM | POA: Diagnosis not present

## 2019-10-07 DIAGNOSIS — M150 Primary generalized (osteo)arthritis: Secondary | ICD-10-CM | POA: Diagnosis not present

## 2019-10-07 DIAGNOSIS — E039 Hypothyroidism, unspecified: Secondary | ICD-10-CM | POA: Diagnosis not present

## 2019-10-10 DIAGNOSIS — I10 Essential (primary) hypertension: Secondary | ICD-10-CM | POA: Diagnosis not present

## 2019-10-23 DIAGNOSIS — Z23 Encounter for immunization: Secondary | ICD-10-CM | POA: Diagnosis not present

## 2019-11-12 ENCOUNTER — Encounter: Payer: Self-pay | Admitting: Podiatrist

## 2019-11-12 ENCOUNTER — Other Ambulatory Visit: Payer: Self-pay

## 2019-11-12 ENCOUNTER — Telehealth: Payer: Self-pay | Admitting: *Deleted

## 2019-11-12 ENCOUNTER — Ambulatory Visit (INDEPENDENT_AMBULATORY_CARE_PROVIDER_SITE_OTHER): Payer: Medicare Other | Admitting: Podiatrist

## 2019-11-12 VITALS — BP 141/83 | HR 79 | Resp 14

## 2019-11-12 DIAGNOSIS — I739 Peripheral vascular disease, unspecified: Secondary | ICD-10-CM

## 2019-11-12 DIAGNOSIS — L600 Ingrowing nail: Secondary | ICD-10-CM

## 2019-11-12 NOTE — Telephone Encounter (Signed)
Faxed orders to [facility]. 

## 2019-11-12 NOTE — Progress Notes (Signed)
    Chief Complaint  Patient presents with  . Foot Problem    the right toenail on the lateral needs to come out      HPI: Patient is 86 y.o. female who presents today for a painful medial corner of the right great toenail.  Patient relates there is significant pain in the corner of the nail that is only relieved when a pedicurist trims the nail corner out deeply.  She has had stenting in the past by [name] and her last ABI's were from 2018.     Review of Systems  DATA OBTAINED: from patient  GENERAL: Feels well no fevers, no fatigue, no changes in appetite SKIN: No itching, no rashes, no open wounds EYES: No eye pain,no redness, no discharge EARS: No earache,no ringing of ears, NOSE: No congestion, no drainage, no bleeding  MOUTH/THROAT: No mouth pain, No sore throat, No difficulty chewing or swallowing  RESPIRATORY: No cough, no wheezing, no SOB CARDIAC: No chest pain,no heart palpitations, GI: No abdominal pain, No Nausea, no vomiting, no diarrhea, no heartburn or no reflux  GU: No dysuria, no increased frequency or urgency MUSCULOSKELETAL: No unrelieved bone/joint pain,  NEUROLOGIC: Awake, alert, appropriate to situation, No change in mental status. PSYCHIATRIC: No overt anxiety or sadness.No behavior issue.      Physical Exam  GENERAL APPEARANCE: Alert, conversant. Appropriately groomed. No acute distress.   VASCULAR: Pedal pulses nonpalpable DP and PT right.    Capillary refill time is 4 seconds to all digits right,  Proximal to distal cooling it warm to warm.  Digital hair growth is absent bilateral   NEUROLOGIC: sensation is intact epicritically and protectively to 5.07 monofilament at 5/5 sites bilateral.  Light touch is intact bilateral, vibratory sensation intact bilateral, achilles tendon reflex is intact bilateral.   MUSCULOSKELETAL: acceptable muscle strength, tone and stability bilateral.  Intrinsic muscluature intact bilateral.  Range of motion at ankle and  first MPJ is normal bilateral.   DERMATOLOGIC: skin is warm, supple, and dry.  No open lesions noted.  No interdigital maceration noted bilateral. Right hallux lateral border is deeply incurvated and painful with pressure.  No infection is noted.     Assessment   Painful ingrowing right hallux nail lateral, history of peripheral vascular disease with stenting nonpalpable  Plan Recommended rechecking her arterial blood flow to be sure her blood flow is enough for healing after a permanent nail procedure.  We will set up abi's and clearance for the nail procedure and will call with the results and if we can go forward.

## 2019-11-12 NOTE — Telephone Encounter (Signed)
-----   Message from Bronson Ing, DPM sent at 11/12/2019  2:14 PM EDT ----- Regarding: doppler test/ barry appt HI Val!  This patient would like her right great toenail partially and permanently removed.  She has been seen by [name] in the past for stents in the right leg.  Her last doppler was 2018.  Could you set up a doppler exam to see if she is a candidate for a nail procedure on her right great toe and for the potential for healing?  I'll finish the note asap.  Thanks!!  E.

## 2019-11-12 NOTE — Patient Instructions (Signed)
I am ordering a doppler study to be sure you have enough blood flow to the right great toe to heal from the procedure of the partial toenail removal.  We will set up the blood flow study and will contact you regarding the date and time of the appointment.  Once we get the results we will and let you know if the permanent procedure is able to be done.

## 2019-11-20 DIAGNOSIS — Z23 Encounter for immunization: Secondary | ICD-10-CM | POA: Diagnosis not present

## 2019-11-26 ENCOUNTER — Ambulatory Visit (HOSPITAL_COMMUNITY)
Admission: RE | Admit: 2019-11-26 | Discharge: 2019-11-26 | Disposition: A | Discharge status: Home / Self Care | Payer: Medicare Other | Source: Ambulatory Visit | Attending: Internal Medicine | Admitting: Internal Medicine

## 2019-11-26 ENCOUNTER — Other Ambulatory Visit: Payer: Self-pay

## 2019-11-26 ENCOUNTER — Telehealth: Payer: Self-pay | Admitting: Cardiovascular Disease

## 2019-11-26 DIAGNOSIS — L600 Ingrowing nail: Secondary | ICD-10-CM | POA: Diagnosis not present

## 2019-11-26 DIAGNOSIS — I739 Peripheral vascular disease, unspecified: Secondary | ICD-10-CM | POA: Insufficient documentation

## 2019-11-26 NOTE — Telephone Encounter (Signed)
Tried to CB pt, LM2CB. Requesting surgeon's office need to request in writing for cardiac clearance. Left detailed message

## 2019-11-26 NOTE — Telephone Encounter (Signed)
Patient was in today for doppler studies and was asking for pre-op clearance for toe surgery from D. Berry.. Does she need to be seen first, please advise.

## 2019-12-02 ENCOUNTER — Other Ambulatory Visit: Payer: Self-pay | Admitting: Cardiovascular Disease

## 2019-12-02 NOTE — Telephone Encounter (Signed)
Rx request sent to pharmacy.  

## 2019-12-03 ENCOUNTER — Telehealth: Payer: Self-pay | Admitting: *Deleted

## 2019-12-03 NOTE — Telephone Encounter (Signed)
I informed pt's POA dtr, Eustaquio Maize of Dr. Shaune Pollack review of results and recommendations. [name] states they would like to schedule with Dr. Valentina Lucks.

## 2019-12-03 NOTE — Telephone Encounter (Signed)
-----   Message from Bronson Ing, DPM sent at 12/01/2019  2:23 PM EDT ----- Hi Val!--   Arterial studies show blood flow is sufficient for healing if she would like to come in and have her permanent toenail procedure done.  -- I can do the procedure for her or she can see another doctor if she prefers a different day than I am here.  She just needs to be called to be scheduled-   Thanks!  Dr. Johnette Abraham

## 2019-12-08 ENCOUNTER — Ambulatory Visit (INDEPENDENT_AMBULATORY_CARE_PROVIDER_SITE_OTHER): Payer: Medicare Other | Admitting: Podiatrist

## 2019-12-08 ENCOUNTER — Other Ambulatory Visit: Payer: Self-pay

## 2019-12-08 DIAGNOSIS — L600 Ingrowing nail: Secondary | ICD-10-CM

## 2019-12-08 DIAGNOSIS — Z961 Presence of intraocular lens: Secondary | ICD-10-CM | POA: Diagnosis not present

## 2019-12-08 DIAGNOSIS — H11821 Conjunctivochalasis, right eye: Secondary | ICD-10-CM | POA: Diagnosis not present

## 2019-12-08 DIAGNOSIS — D23111 Other benign neoplasm of skin of right upper eyelid, including canthus: Secondary | ICD-10-CM | POA: Diagnosis not present

## 2019-12-08 DIAGNOSIS — H10503 Unspecified blepharoconjunctivitis, bilateral: Secondary | ICD-10-CM | POA: Diagnosis not present

## 2019-12-08 NOTE — Progress Notes (Signed)
   HPI: Patient is 86 y.o. female who presents today with her daughter for permanent removal of the right hallux toenail lateral border.  She has had this problem for years and she wants it fixed.   She has been having the pedicurist cut way down on her nail to pull out the ingrown and she doesn't want to have to go through that any longer. She was seen in the vascular lab and did get her vascular studies performed.     Allergies  Allergen Reactions  . Lipitor [Atorvastatin]     Pain in the back of her calves  . Morphine Other (See Comments)    Other Reaction: GI Upset  . Niacin Other (See Comments)    Other Reaction: Other reaction  . Nsaids     Increased Creatinine  . Oxycodone-Acetaminophen Other (See Comments)    Other Reaction: Other reaction    Review of systems is reviewed and negative.   Physical Exam  Patient is awake, alert, and oriented x 3.  In no acute distress.    Vascular status is unchanged with non- palpable pedal pulses DP and PT bilateral., capillary refill time less than 3 seconds bilateral.  No edema or erythema noted. Recent vascular studies show ABI of right is .74 with toe pressure to the right first as normal.    Neurological exam reveals epicritic and protective sensation grossly intact bilateral.   Dermatological exam reveals skin is supple and dry to bilateral feet.  No open lesions present.  Right hallux nail is deeply incurvated on the laterall border.  No active drainage is noted.    Musculoskeletal exam: Musculature intact with dorsiflexion, plantarflexion, inversion, eversion.   Assessment: ingrown right hallux nail lateral border-  pvd  Plan: Discussed her pvd and the risk for not healing after this procedure.  Although her ABI's are Leroy Sea is a risk for complications from the procedure.  The patient requests the procedure be performed.  Consent form is signed. . Skin was prepped with alcohol and a local injection of lidocaine and Marcaine  plain was infiltrated to anesthetize the toe. The toe was then prepped with Betadine exsanguinated. The offending nail border is removed and phenol applied.  It was cleansed well with alcohol. Antibiotic ointment and a dressing was then applied and the patient was given instructions for soaking and aftercare. I will see her back in 1 week for a recheck

## 2019-12-08 NOTE — Patient Instructions (Signed)

## 2019-12-11 ENCOUNTER — Encounter: Payer: Self-pay | Admitting: Podiatrist

## 2019-12-15 ENCOUNTER — Other Ambulatory Visit: Payer: Self-pay

## 2019-12-15 ENCOUNTER — Encounter: Payer: Self-pay | Admitting: Podiatrist

## 2019-12-15 ENCOUNTER — Ambulatory Visit (INDEPENDENT_AMBULATORY_CARE_PROVIDER_SITE_OTHER): Payer: Medicare Other | Admitting: Podiatrist

## 2019-12-15 VITALS — Temp 96.9°F

## 2019-12-15 DIAGNOSIS — L600 Ingrowing nail: Secondary | ICD-10-CM

## 2019-12-15 NOTE — Patient Instructions (Signed)
Its ok to leave the toe open to air at night when you notice no more bleeding on the bandage.  You may also stop wearing the bandage completely when it is dry on the inside when you remove it (probably a week to 2 weeks).   Keep soaking in epsom salts until Friday-  After that, you can just keep the toe clean with soap and water.    Call if you have any concerns.

## 2019-12-15 NOTE — Progress Notes (Signed)
Chief Complaint  Patient presents with  . Follow-up    R great hallux ingrown check; "doing great except for little bleeding and redness in corner"    Patient presents today for follow up of permanent partial nail avulsion right hallux.  Patient and her daughter state it is doing well and she is soaking as instructed and dressing the toe as instructed.   Objective:  Neurovascular status unchanged. Excellent healing of the right hallux seen.  No redness or swelling noted. No sign of infection.  Assessment/Plan:  S/p phenol matrixectomy right hallux  Plan:  She may stop soaks on Friday and keep the toe clean with soap and water after that.  She will continue to wear the bandage until no oozing is seen on the bandaid. She may start leaving the toe open to air at night.  She will return if any concerns arise.

## 2019-12-19 ENCOUNTER — Telehealth: Payer: Self-pay | Admitting: Cardiovascular Disease

## 2019-12-19 NOTE — Telephone Encounter (Signed)
Jennette Kettle will be coming to the patients upcoming 12/24/19 appointment to assist d/t patient being hearing impaired.

## 2019-12-19 NOTE — Telephone Encounter (Signed)
Patient's daughter [name] calling to request her sister Jennifer Atkinson come with the patient to her appointment 12/24/2019, because she states the patient is deaf.

## 2019-12-24 ENCOUNTER — Other Ambulatory Visit: Payer: Self-pay

## 2019-12-24 ENCOUNTER — Ambulatory Visit (INDEPENDENT_AMBULATORY_CARE_PROVIDER_SITE_OTHER): Payer: Medicare Other | Admitting: Cardiovascular Disease

## 2019-12-24 ENCOUNTER — Encounter: Payer: Self-pay | Admitting: Cardiovascular Disease

## 2019-12-24 VITALS — BP 158/62 | HR 83 | Ht 67.0 in | Wt 157.8 lb

## 2019-12-24 DIAGNOSIS — E785 Hyperlipidemia, unspecified: Secondary | ICD-10-CM | POA: Diagnosis not present

## 2019-12-24 DIAGNOSIS — I10 Essential (primary) hypertension: Secondary | ICD-10-CM | POA: Diagnosis not present

## 2019-12-24 DIAGNOSIS — I739 Peripheral vascular disease, unspecified: Secondary | ICD-10-CM | POA: Diagnosis not present

## 2019-12-24 NOTE — Assessment & Plan Note (Signed)
History of essential hypertension with blood pressure measured today at 158/62.  She is on amlodipine and hydrochlorothiazide.

## 2019-12-24 NOTE — Assessment & Plan Note (Signed)
History of peripheral arterial disease status post multiple lower extremity interventions by myself over the years with recent Dopplers performed 11/27/2019 revealing a right ABI of 0.74 and a left of 0.57.  The stents in her right SFA are patent.  She has a high-frequency signal in her mid left SFA.  She denies claudication.

## 2019-12-24 NOTE — Assessment & Plan Note (Signed)
History of hyperlipidemia on Pravachol followed by her PCP.

## 2019-12-24 NOTE — Progress Notes (Signed)
12/24/2019 Jennifer Atkinson   07/06/1920  [id number]  Primary Physician Jennifer Atkinson, L.Marlou Sa, MD Primary Cardiologist: Jennifer Harp MD FACP, [place], [place], [place]  HPI:  Jennifer Atkinson is a 86 y.o.  mildly overweight widowed Caucasian female, mother of 26 and grandmother to 52 grandchildren, accompanied by one of her daughters  Jennifer Atkinson today. She was originally referred to me for claudication. I last saw her in the office  06/18/2018.  I performed angiogram on her Jan 25, 2012, and revealed segmentally stenosed mid right SFA, which I stented resulting in improvement in her Dopplers; however, her symptoms did not significantly improve. She ultimately had back surgery, which resulted in marked improvement in her symptoms. Followup Dopplers performed December 02, 2012, showed that her stent was functionally occluded with a decrease in her ABIs from the 0.8 range to 0.43, though she denies claudication. In addition, she denies chest pain or shortness of breath. She did have a negative Myoview.  Since I saw her back in May she developed pain in her right foot. Some wan not clear whether this was ischemic versus orthopedic.I examined her on 04/08/13 revealing an occluded right SFA. I ultimately percutaneously revascularized her with overlapping bye-bye and covered stents. Preprocedure ABI was 0.95 and she had marked improvement in her claudication symptoms. Over the last several months she's noticed recurrent right calf claudication with decline in her ABI to .81. She's developed a new high-frequency signal at the origin of her right SFA. I re\re intervention on 01/05/14 revealing a high-grade lesion in the proximal right SFA at the leading edge of the previously placed stent. I re-stented this with a Viabahn covered stent with excellent angiographic, clinical and ultrasonographic result. She recently fell and injured her right shoulder and arm. She also complains of some right hip and thigh pain. Recent Dopplers  performed in the office 08/31/15 revealed a right ABI 1.1 with a new high-frequency signal at the previously stented origin of the right SFA. Dopplers performed 04/10/16 revealed a-T signal the origin of the right SFA with an ABI 0.87 which is down from 1.1. She'd also has a new high-frequency signal in her distal left SFA with a reduction in her left ABI down to 0.71 although she denies claudication. Her most recent Dopplers performed 10/09/16 revealed a right ABI of 0.87 with a widely patent stent in the left ABI 0.7.  Since I saw her a year and a Atkinson ago she continues to do well.  She recently had her right great toenail trimmed by podiatrist.  She denies claudication, chest pain or shortness of breath.  She does live alone in the house that her father built back in 68.    Current Meds  Medication Sig  . ALPRAZolam (XANAX) 0.5 MG tablet Take 0.25-0.5 mg by mouth 2 (two) times daily as needed.  Marland Kitchen amLODipine (NORVASC) 5 MG tablet Take 1 tablet (5 mg total) by mouth daily.  Marland Kitchen aspirin 81 MG chewable tablet Chew 81 mg by mouth daily.  . calcium carbonate (OS-CAL) 600 MG TABS tablet Take 600 mg by mouth daily.  . clopidogrel (PLAVIX) 75 MG tablet Take 1 tablet (75 mg total) by mouth daily. Please schedule appointment with [name] for refills.  . fluorouracil (EFUDEX) 5 % cream fluorouracil 5 % topical cream  APPLY CREAM TOPICALLY TO AFFECTED AREA TWICE DAILY FOR 14 DAYS  . hydrochlorothiazide 25 MG tablet Take 25 mg by mouth daily.    Marland Kitchen HYDROcodone-acetaminophen (NORCO/VICODIN) 5-325  MG per tablet Take 1-2 tablets by mouth every 4 (four) hours as needed for pain.  . Lactobacillus (ACIDOPHILUS) CHEW Chew 1 tablet by mouth daily.  Marland Kitchen levothyroxine (SYNTHROID, LEVOTHROID) 75 MCG tablet Take 75 mcg by mouth daily before breakfast.  . Misc Natural Products (OSTEO BI-FLEX ADV DOUBLE ST) TABS Take 1 tablet by mouth daily.  . Omega-3 Fatty Acids (FISH OIL) 1000 MG CAPS Take 1 capsule by mouth daily.  Marland Kitchen  omeprazole (PRILOSEC) 10 MG capsule Take 20 mg by mouth daily.   . pravastatin (PRAVACHOL) 40 MG tablet TAKE ONE TABLET BY MOUTH IN THE EVENING AT  6  PM  . pravastatin (PRAVACHOL) 40 MG tablet Take 1 tablet (40 mg total) by mouth daily. NEED OV.  . prednisoLONE acetate (PRED FORTE) 1 % ophthalmic suspension prednisolone acetate 1 % eye drops,suspension  INSTILL 1 DROP INTO LEFT EYE TWICE DAILY. USE FOR 5 DAYS THEN STOP  . promethazine-codeine (PHENERGAN WITH CODEINE) 6.25-10 MG/5ML syrup promethazine 6.25 mg-codeine 10 mg/5 mL syrup  TAKE 5 ML BY MOUTH EVERY 4 HOURS AS NEEDED FOR COUGH     Allergies  Allergen Reactions  . Lipitor [Atorvastatin]     Pain in the back of her calves  . Morphine Other (See Comments)    Other Reaction: GI Upset  . Niacin Other (See Comments)    Other Reaction: Other reaction  . Nsaids     Increased Creatinine  . Oxycodone-Acetaminophen Other (See Comments)    Other Reaction: Other reaction    Social History   Socioeconomic History  . Marital status: Widowed    Spouse name: Not on file  . Number of children: Not on file  . Years of education: Not on file  . Highest education level: Not on file  Occupational History  . Not on file  Tobacco Use  . Smoking status: Never Smoker  . Smokeless tobacco: Never Used  Substance and Sexual Activity  . Alcohol use: Yes    Alcohol/week: 4.0 standard drinks    Types: 4 Glasses of wine per week    Comment: socially  . Drug use: No  . Sexual activity: Never  Other Topics Concern  . Not on file  Social History Narrative  . Not on file   Social Determinants of Health   Financial Resource Strain:   . Difficulty of Paying Living Expenses:   Food Insecurity:   . Worried About Charity fundraiser in the Last Year:   . Arboriculturist in the Last Year:   Transportation Needs:   . Film/video editor (Medical):   Marland Kitchen Lack of Transportation (Non-Medical):   Physical Activity:   . Days of Exercise per  Week:   . Minutes of Exercise per Session:   Stress:   . Feeling of Stress :   Social Connections:   . Frequency of Communication with Friends and Family:   . Frequency of Social Gatherings with Friends and Family:   . Attends Religious Services:   . Active Member of Clubs or Organizations:   . Attends Archivist Meetings:   Marland Kitchen Marital Status:   Intimate Partner Violence:   . Fear of Current or Ex-Partner:   . Emotionally Abused:   Marland Kitchen Physically Abused:   . Sexually Abused:      Review of Systems: General: negative for chills, fever, night sweats or weight changes.  Cardiovascular: negative for chest pain, dyspnea on exertion, edema, orthopnea, palpitations, paroxysmal nocturnal dyspnea or  shortness of breath Dermatological: negative for rash Respiratory: negative for cough or wheezing Urologic: negative for hematuria Abdominal: negative for nausea, vomiting, diarrhea, bright red blood per rectum, melena, or hematemesis Neurologic: negative for visual changes, syncope, or dizziness All other systems reviewed and are otherwise negative except as noted above.    Blood pressure (!) 158/62, pulse 83, height 5\' 7"  (1.702 m), weight 157 lb 12.8 oz (71.6 kg).  General appearance: alert and no distress Neck: no adenopathy, no carotid bruit, no JVD, supple, symmetrical, trachea midline and thyroid not enlarged, symmetric, no tenderness/mass/nodules Lungs: clear to auscultation bilaterally Heart: regular rate and rhythm, S1, S2 normal, no murmur, click, rub or gallop Extremities: extremities normal, atraumatic, no cyanosis or edema Pulses: 2+ and symmetric Skin: Skin color, texture, turgor normal. No rashes or lesions Neurologic: Alert and oriented X 3, normal strength and tone. Normal symmetric reflexes. Normal coordination and gait  EKG sinus rhythm at 83 with occasional PACs.  I personally reviewed this EKG.  Recommend  ASSESSMENT AND PLAN:   PVD, Rt SFA May 2013, Aug  2014, May 2015 (ISR) History of peripheral arterial disease status post multiple lower extremity interventions by myself over the years with recent Dopplers performed 11/27/2019 revealing a right ABI of 0.74 and a left of 0.57.  The stents in her right SFA are patent.  She has a high-frequency signal in her mid left SFA.  She denies claudication.  HTN (hypertension) History of essential hypertension with blood pressure measured today at 158/62.  She is on amlodipine and hydrochlorothiazide.  Dyslipidemia History of hyperlipidemia on Pravachol followed by her PCP.      Jennifer Harp MD FACP,FACC,FAHA, [facility] 12/24/2019 4:25 PM

## 2019-12-24 NOTE — Patient Instructions (Signed)

## 2019-12-26 ENCOUNTER — Other Ambulatory Visit: Payer: Self-pay | Admitting: Cardiovascular Disease

## 2019-12-30 ENCOUNTER — Other Ambulatory Visit: Payer: Self-pay

## 2019-12-30 NOTE — Telephone Encounter (Signed)
Pt's daughter calling requesting a 90 day supply on these medications. Please address

## 2020-01-05 DIAGNOSIS — H11821 Conjunctivochalasis, right eye: Secondary | ICD-10-CM | POA: Diagnosis not present

## 2020-01-05 DIAGNOSIS — H10503 Unspecified blepharoconjunctivitis, bilateral: Secondary | ICD-10-CM | POA: Diagnosis not present

## 2020-01-05 DIAGNOSIS — D23111 Other benign neoplasm of skin of right upper eyelid, including canthus: Secondary | ICD-10-CM | POA: Diagnosis not present

## 2020-01-05 DIAGNOSIS — Z961 Presence of intraocular lens: Secondary | ICD-10-CM | POA: Diagnosis not present

## 2020-03-15 DIAGNOSIS — H11821 Conjunctivochalasis, right eye: Secondary | ICD-10-CM | POA: Diagnosis not present

## 2020-03-15 DIAGNOSIS — H10503 Unspecified blepharoconjunctivitis, bilateral: Secondary | ICD-10-CM | POA: Diagnosis not present

## 2020-03-15 DIAGNOSIS — D23111 Other benign neoplasm of skin of right upper eyelid, including canthus: Secondary | ICD-10-CM | POA: Diagnosis not present

## 2020-03-15 DIAGNOSIS — Z961 Presence of intraocular lens: Secondary | ICD-10-CM | POA: Diagnosis not present

## 2020-06-07 ENCOUNTER — Other Ambulatory Visit: Payer: Self-pay | Admitting: Cardiovascular Disease

## 2020-06-15 DIAGNOSIS — Z23 Encounter for immunization: Secondary | ICD-10-CM | POA: Diagnosis not present

## 2020-06-15 DIAGNOSIS — E039 Hypothyroidism, unspecified: Secondary | ICD-10-CM | POA: Diagnosis not present

## 2020-06-15 DIAGNOSIS — F419 Anxiety disorder, unspecified: Secondary | ICD-10-CM | POA: Diagnosis not present

## 2020-06-15 DIAGNOSIS — M150 Primary generalized (osteo)arthritis: Secondary | ICD-10-CM | POA: Diagnosis not present

## 2020-06-15 DIAGNOSIS — N183 Chronic kidney disease, stage 3 unspecified: Secondary | ICD-10-CM | POA: Diagnosis not present

## 2020-06-15 DIAGNOSIS — I10 Essential (primary) hypertension: Secondary | ICD-10-CM | POA: Diagnosis not present

## 2020-06-15 DIAGNOSIS — K219 Gastro-esophageal reflux disease without esophagitis: Secondary | ICD-10-CM | POA: Diagnosis not present

## 2020-06-15 DIAGNOSIS — I739 Peripheral vascular disease, unspecified: Secondary | ICD-10-CM | POA: Diagnosis not present

## 2020-06-23 DIAGNOSIS — Z23 Encounter for immunization: Secondary | ICD-10-CM | POA: Diagnosis not present

## 2020-08-13 DIAGNOSIS — G894 Chronic pain syndrome: Secondary | ICD-10-CM | POA: Diagnosis not present

## 2020-09-21 DIAGNOSIS — M5416 Radiculopathy, lumbar region: Secondary | ICD-10-CM | POA: Diagnosis not present

## 2020-11-15 DIAGNOSIS — R109 Unspecified abdominal pain: Secondary | ICD-10-CM | POA: Diagnosis not present

## 2020-12-23 DIAGNOSIS — I739 Peripheral vascular disease, unspecified: Secondary | ICD-10-CM | POA: Diagnosis not present

## 2020-12-23 DIAGNOSIS — E039 Hypothyroidism, unspecified: Secondary | ICD-10-CM | POA: Diagnosis not present

## 2020-12-23 DIAGNOSIS — Z0000 Encounter for general adult medical examination without abnormal findings: Secondary | ICD-10-CM | POA: Diagnosis not present

## 2020-12-23 DIAGNOSIS — F419 Anxiety disorder, unspecified: Secondary | ICD-10-CM | POA: Diagnosis not present

## 2020-12-23 DIAGNOSIS — N1831 Chronic kidney disease, stage 3a: Secondary | ICD-10-CM | POA: Diagnosis not present

## 2020-12-23 DIAGNOSIS — K219 Gastro-esophageal reflux disease without esophagitis: Secondary | ICD-10-CM | POA: Diagnosis not present

## 2020-12-23 DIAGNOSIS — M150 Primary generalized (osteo)arthritis: Secondary | ICD-10-CM | POA: Diagnosis not present

## 2020-12-23 DIAGNOSIS — I10 Essential (primary) hypertension: Secondary | ICD-10-CM | POA: Diagnosis not present

## 2020-12-24 ENCOUNTER — Emergency Department (HOSPITAL_COMMUNITY): Payer: Medicare Other

## 2020-12-24 ENCOUNTER — Ambulatory Visit: Payer: Medicare Other | Admitting: Cardiovascular Disease

## 2020-12-24 ENCOUNTER — Encounter (HOSPITAL_COMMUNITY): Payer: Self-pay | Admitting: Internal Medicine

## 2020-12-24 ENCOUNTER — Inpatient Hospital Stay (HOSPITAL_COMMUNITY)
Admission: EM | Admit: 2020-12-24 | Discharge: 2020-12-26 | DRG: 065 | Disposition: A | Discharge status: Hospice | Payer: Medicare Other | Attending: Internal Medicine | Admitting: Internal Medicine

## 2020-12-24 DIAGNOSIS — Z79890 Hormone replacement therapy (postmenopausal): Secondary | ICD-10-CM | POA: Diagnosis not present

## 2020-12-24 DIAGNOSIS — R54 Age-related physical debility: Secondary | ICD-10-CM | POA: Diagnosis present

## 2020-12-24 DIAGNOSIS — I639 Cerebral infarction, unspecified: Secondary | ICD-10-CM | POA: Diagnosis not present

## 2020-12-24 DIAGNOSIS — Z515 Encounter for palliative care: Secondary | ICD-10-CM | POA: Diagnosis not present

## 2020-12-24 DIAGNOSIS — I739 Peripheral vascular disease, unspecified: Secondary | ICD-10-CM | POA: Diagnosis not present

## 2020-12-24 DIAGNOSIS — Z7401 Bed confinement status: Secondary | ICD-10-CM | POA: Diagnosis not present

## 2020-12-24 DIAGNOSIS — Z823 Family history of stroke: Secondary | ICD-10-CM | POA: Diagnosis not present

## 2020-12-24 DIAGNOSIS — E785 Hyperlipidemia, unspecified: Secondary | ICD-10-CM | POA: Diagnosis present

## 2020-12-24 DIAGNOSIS — N183 Chronic kidney disease, stage 3 unspecified: Secondary | ICD-10-CM | POA: Diagnosis present

## 2020-12-24 DIAGNOSIS — R0902 Hypoxemia: Secondary | ICD-10-CM | POA: Diagnosis not present

## 2020-12-24 DIAGNOSIS — Z885 Allergy status to narcotic agent status: Secondary | ICD-10-CM | POA: Diagnosis not present

## 2020-12-24 DIAGNOSIS — R609 Edema, unspecified: Secondary | ICD-10-CM | POA: Diagnosis not present

## 2020-12-24 DIAGNOSIS — Z66 Do not resuscitate: Secondary | ICD-10-CM | POA: Diagnosis not present

## 2020-12-24 DIAGNOSIS — T796XXD Traumatic ischemia of muscle, subsequent encounter: Secondary | ICD-10-CM | POA: Diagnosis not present

## 2020-12-24 DIAGNOSIS — I129 Hypertensive chronic kidney disease with stage 1 through stage 4 chronic kidney disease, or unspecified chronic kidney disease: Secondary | ICD-10-CM | POA: Diagnosis present

## 2020-12-24 DIAGNOSIS — I4821 Permanent atrial fibrillation: Secondary | ICD-10-CM | POA: Diagnosis present

## 2020-12-24 DIAGNOSIS — M4312 Spondylolisthesis, cervical region: Secondary | ICD-10-CM | POA: Diagnosis not present

## 2020-12-24 DIAGNOSIS — R404 Transient alteration of awareness: Secondary | ICD-10-CM | POA: Diagnosis not present

## 2020-12-24 DIAGNOSIS — R410 Disorientation, unspecified: Secondary | ICD-10-CM | POA: Diagnosis not present

## 2020-12-24 DIAGNOSIS — I69354 Hemiplegia and hemiparesis following cerebral infarction affecting left non-dominant side: Secondary | ICD-10-CM | POA: Diagnosis not present

## 2020-12-24 DIAGNOSIS — J984 Other disorders of lung: Secondary | ICD-10-CM | POA: Diagnosis not present

## 2020-12-24 DIAGNOSIS — I672 Cerebral atherosclerosis: Secondary | ICD-10-CM | POA: Diagnosis not present

## 2020-12-24 DIAGNOSIS — E039 Hypothyroidism, unspecified: Secondary | ICD-10-CM | POA: Diagnosis present

## 2020-12-24 DIAGNOSIS — M6282 Rhabdomyolysis: Secondary | ICD-10-CM | POA: Diagnosis not present

## 2020-12-24 DIAGNOSIS — Z7982 Long term (current) use of aspirin: Secondary | ICD-10-CM | POA: Diagnosis not present

## 2020-12-24 DIAGNOSIS — Z7902 Long term (current) use of antithrombotics/antiplatelets: Secondary | ICD-10-CM | POA: Diagnosis not present

## 2020-12-24 DIAGNOSIS — R4701 Aphasia: Secondary | ICD-10-CM | POA: Diagnosis present

## 2020-12-24 DIAGNOSIS — G8194 Hemiplegia, unspecified affecting left nondominant side: Secondary | ICD-10-CM | POA: Diagnosis present

## 2020-12-24 DIAGNOSIS — Z043 Encounter for examination and observation following other accident: Secondary | ICD-10-CM | POA: Diagnosis not present

## 2020-12-24 DIAGNOSIS — N1832 Chronic kidney disease, stage 3b: Secondary | ICD-10-CM | POA: Diagnosis present

## 2020-12-24 DIAGNOSIS — J320 Chronic maxillary sinusitis: Secondary | ICD-10-CM | POA: Diagnosis not present

## 2020-12-24 DIAGNOSIS — T1490XA Injury, unspecified, initial encounter: Secondary | ICD-10-CM

## 2020-12-24 DIAGNOSIS — Y92003 Bedroom of unspecified non-institutional (private) residence as the place of occurrence of the external cause: Secondary | ICD-10-CM | POA: Diagnosis not present

## 2020-12-24 DIAGNOSIS — K219 Gastro-esophageal reflux disease without esophagitis: Secondary | ICD-10-CM | POA: Diagnosis present

## 2020-12-24 DIAGNOSIS — I63531 Cerebral infarction due to unspecified occlusion or stenosis of right posterior cerebral artery: Principal | ICD-10-CM | POA: Diagnosis present

## 2020-12-24 DIAGNOSIS — Z20822 Contact with and (suspected) exposure to covid-19: Secondary | ICD-10-CM | POA: Diagnosis present

## 2020-12-24 DIAGNOSIS — J3489 Other specified disorders of nose and nasal sinuses: Secondary | ICD-10-CM | POA: Diagnosis not present

## 2020-12-24 DIAGNOSIS — R4781 Slurred speech: Secondary | ICD-10-CM | POA: Diagnosis present

## 2020-12-24 DIAGNOSIS — W1830XA Fall on same level, unspecified, initial encounter: Secondary | ICD-10-CM | POA: Diagnosis present

## 2020-12-24 DIAGNOSIS — I6300 Cerebral infarction due to thrombosis of unspecified precerebral artery: Secondary | ICD-10-CM

## 2020-12-24 DIAGNOSIS — D72829 Elevated white blood cell count, unspecified: Secondary | ICD-10-CM | POA: Diagnosis present

## 2020-12-24 DIAGNOSIS — T796XXA Traumatic ischemia of muscle, initial encounter: Secondary | ICD-10-CM | POA: Diagnosis present

## 2020-12-24 DIAGNOSIS — I482 Chronic atrial fibrillation, unspecified: Secondary | ICD-10-CM | POA: Diagnosis not present

## 2020-12-24 DIAGNOSIS — Z90710 Acquired absence of both cervix and uterus: Secondary | ICD-10-CM | POA: Diagnosis not present

## 2020-12-24 DIAGNOSIS — Z8673 Personal history of transient ischemic attack (TIA), and cerebral infarction without residual deficits: Secondary | ICD-10-CM | POA: Diagnosis not present

## 2020-12-24 DIAGNOSIS — I63233 Cerebral infarction due to unspecified occlusion or stenosis of bilateral carotid arteries: Secondary | ICD-10-CM | POA: Diagnosis not present

## 2020-12-24 DIAGNOSIS — Z79899 Other long term (current) drug therapy: Secondary | ICD-10-CM

## 2020-12-24 DIAGNOSIS — M47812 Spondylosis without myelopathy or radiculopathy, cervical region: Secondary | ICD-10-CM | POA: Diagnosis not present

## 2020-12-24 DIAGNOSIS — E876 Hypokalemia: Secondary | ICD-10-CM | POA: Diagnosis present

## 2020-12-24 DIAGNOSIS — S50812A Abrasion of left forearm, initial encounter: Secondary | ICD-10-CM | POA: Diagnosis present

## 2020-12-24 DIAGNOSIS — R29725 NIHSS score 25: Secondary | ICD-10-CM | POA: Diagnosis present

## 2020-12-24 DIAGNOSIS — Z960 Presence of urogenital implants: Secondary | ICD-10-CM | POA: Diagnosis not present

## 2020-12-24 DIAGNOSIS — I10 Essential (primary) hypertension: Secondary | ICD-10-CM | POA: Diagnosis not present

## 2020-12-24 DIAGNOSIS — I69820 Aphasia following other cerebrovascular disease: Secondary | ICD-10-CM | POA: Diagnosis not present

## 2020-12-24 DIAGNOSIS — M2550 Pain in unspecified joint: Secondary | ICD-10-CM | POA: Diagnosis not present

## 2020-12-24 DIAGNOSIS — I6782 Cerebral ischemia: Secondary | ICD-10-CM | POA: Diagnosis not present

## 2020-12-24 DIAGNOSIS — R4182 Altered mental status, unspecified: Secondary | ICD-10-CM | POA: Diagnosis not present

## 2020-12-24 LAB — DIFFERENTIAL
Abs Immature Granulocytes: 0.08 10*3/uL — ABNORMAL HIGH (ref 0.00–0.07)
Basophils Absolute: 0 10*3/uL (ref 0.0–0.1)
Basophils Relative: 0 %
Eosinophils Absolute: 0 10*3/uL (ref 0.0–0.5)
Eosinophils Relative: 0 %
Immature Granulocytes: 1 %
Lymphocytes Relative: 8 %
Lymphs Abs: 1.2 10*3/uL (ref 0.7–4.0)
Monocytes Absolute: 0.6 10*3/uL (ref 0.1–1.0)
Monocytes Relative: 4 %
Neutro Abs: 13.6 10*3/uL — ABNORMAL HIGH (ref 1.7–7.7)
Neutrophils Relative %: 87 %

## 2020-12-24 LAB — I-STAT CHEM 8, ED
BUN: 34 mg/dL — ABNORMAL HIGH (ref 8–23)
Calcium, Ion: 1 mmol/L — ABNORMAL LOW (ref 1.15–1.40)
Chloride: 99 mmol/L (ref 98–111)
Creatinine, Ser: 0.9 mg/dL (ref 0.44–1.00)
Glucose, Bld: 129 mg/dL — ABNORMAL HIGH (ref 70–99)
HCT: 54 % — ABNORMAL HIGH (ref 36.0–46.0)
Hemoglobin: 18.4 g/dL — ABNORMAL HIGH (ref 12.0–15.0)
Potassium: 3.1 mmol/L — ABNORMAL LOW (ref 3.5–5.1)
Sodium: 136 mmol/L (ref 135–145)
TCO2: 25 mmol/L (ref 22–32)

## 2020-12-24 LAB — PROTIME-INR
INR: 1 (ref 0.8–1.2)
Prothrombin Time: 12.7 seconds (ref 11.4–15.2)

## 2020-12-24 LAB — URINALYSIS, ROUTINE W REFLEX MICROSCOPIC
Bilirubin Urine: NEGATIVE
Glucose, UA: NEGATIVE mg/dL
Hgb urine dipstick: NEGATIVE
Ketones, ur: NEGATIVE mg/dL
Leukocytes,Ua: NEGATIVE
Nitrite: NEGATIVE
Protein, ur: NEGATIVE mg/dL
Specific Gravity, Urine: 1.021 (ref 1.005–1.030)
pH: 6 (ref 5.0–8.0)

## 2020-12-24 LAB — COMPREHENSIVE METABOLIC PANEL
ALT: 24 U/L (ref 0–44)
AST: 63 U/L — ABNORMAL HIGH (ref 15–41)
Albumin: 4.4 g/dL (ref 3.5–5.0)
Alkaline Phosphatase: 62 U/L (ref 38–126)
Anion gap: 14 (ref 5–15)
BUN: 29 mg/dL — ABNORMAL HIGH (ref 8–23)
CO2: 22 mmol/L (ref 22–32)
Calcium: 9.4 mg/dL (ref 8.9–10.3)
Chloride: 98 mmol/L (ref 98–111)
Creatinine, Ser: 1.1 mg/dL — ABNORMAL HIGH (ref 0.44–1.00)
GFR, Estimated: 45 mL/min — ABNORMAL LOW (ref >60)
Glucose, Bld: 130 mg/dL — ABNORMAL HIGH (ref 70–99)
Potassium: 3.3 mmol/L — ABNORMAL LOW (ref 3.5–5.1)
Sodium: 134 mmol/L — ABNORMAL LOW (ref 135–145)
Total Bilirubin: 1.2 mg/dL (ref 0.3–1.2)
Total Protein: 7.5 g/dL (ref 6.5–8.1)

## 2020-12-24 LAB — CBC
HCT: 51.3 % — ABNORMAL HIGH (ref 36.0–46.0)
Hemoglobin: 17.6 g/dL — ABNORMAL HIGH (ref 12.0–15.0)
MCH: 31 pg (ref 26.0–34.0)
MCHC: 34.3 g/dL (ref 30.0–36.0)
MCV: 90.3 fL (ref 80.0–100.0)
Platelets: 271 10*3/uL (ref 150–400)
RBC: 5.68 MIL/uL — ABNORMAL HIGH (ref 3.87–5.11)
RDW: 13.3 % (ref 11.5–15.5)
WBC: 15.6 10*3/uL — ABNORMAL HIGH (ref 4.0–10.5)
nRBC: 0 % (ref 0.0–0.2)

## 2020-12-24 LAB — CK: Total CK: 2491 U/L — ABNORMAL HIGH (ref 38–234)

## 2020-12-24 LAB — RAPID URINE DRUG SCREEN, HOSP PERFORMED
Amphetamines: NOT DETECTED
Barbiturates: NOT DETECTED
Benzodiazepines: POSITIVE — AB
Cocaine: NOT DETECTED
Opiates: POSITIVE — AB
Tetrahydrocannabinol: NOT DETECTED

## 2020-12-24 LAB — APTT: aPTT: 24 seconds (ref 24–36)

## 2020-12-24 LAB — RESP PANEL BY RT-PCR (FLU A&B, COVID) ARPGX2
Influenza A by PCR: NEGATIVE
Influenza B by PCR: NEGATIVE
SARS Coronavirus 2 by RT PCR: NEGATIVE

## 2020-12-24 LAB — ETHANOL: Alcohol, Ethyl (B): <10 mg/dL (ref <10)

## 2020-12-24 MED ORDER — ONDANSETRON HCL 4 MG/2ML IJ SOLN: 4.0000 mg | Freq: Four times a day (QID) | INTRAMUSCULAR | Status: DC | PRN

## 2020-12-24 MED ORDER — HALOPERIDOL LACTATE 5 MG/ML IJ SOLN: 0.5000 mg | INTRAMUSCULAR | Status: DC | PRN

## 2020-12-24 MED ORDER — HALOPERIDOL LACTATE 2 MG/ML PO CONC
0.5000 mg | ORAL | Status: DC | PRN
  Filled 2020-12-24: qty 0.3

## 2020-12-24 MED ORDER — ACETAMINOPHEN 160 MG/5ML PO SOLN: 650.0000 mg | ORAL | Status: DC | PRN

## 2020-12-24 MED ORDER — IOHEXOL 350 MG/ML SOLN
100.0000 mL | Freq: Once | INTRAVENOUS | Status: AC | PRN
  Administered 2020-12-24: 100 mL via INTRAVENOUS

## 2020-12-24 MED ORDER — STROKE: EARLY STAGES OF RECOVERY BOOK: Freq: Once | Status: DC

## 2020-12-24 MED ORDER — HALOPERIDOL 0.5 MG PO TABS: 0.5000 mg | ORAL_TABLET | ORAL | Status: DC | PRN

## 2020-12-24 MED ORDER — LORAZEPAM 1 MG PO TABS
1.0000 mg | ORAL_TABLET | ORAL | Status: DC | PRN
  Filled 2020-12-24: qty 1

## 2020-12-24 MED ORDER — GLYCOPYRROLATE 0.2 MG/ML IJ SOLN: 0.2000 mg | INTRAMUSCULAR | Status: DC | PRN

## 2020-12-24 MED ORDER — POLYVINYL ALCOHOL 1.4 % OP SOLN
1.0000 [drp] | Freq: Four times a day (QID) | OPHTHALMIC | Status: DC | PRN
  Filled 2020-12-24: qty 15

## 2020-12-24 MED ORDER — ALBUTEROL SULFATE (2.5 MG/3ML) 0.083% IN NEBU: 2.5000 mg | INHALATION_SOLUTION | RESPIRATORY_TRACT | Status: DC | PRN

## 2020-12-24 MED ORDER — FENTANYL CITRATE (PF) 100 MCG/2ML IJ SOLN
25.0000 ug | INTRAMUSCULAR | Status: DC | PRN
Start: 2020-12-24 — End: 2020-12-24

## 2020-12-24 MED ORDER — SODIUM CHLORIDE 0.9 % IV SOLN: INTRAVENOUS | Status: DC

## 2020-12-24 MED ORDER — FENTANYL 2500MCG IN NS 250ML (10MCG/ML) PREMIX INFUSION
20.0000 ug/h | INTRAVENOUS | Status: DC
  Administered 2020-12-24: 20 ug/h via INTRAVENOUS
  Filled 2020-12-24: qty 250

## 2020-12-24 MED ORDER — ACETAMINOPHEN 650 MG RE SUPP: 650.0000 mg | RECTAL | Status: DC | PRN

## 2020-12-24 MED ORDER — LORAZEPAM 2 MG/ML PO CONC: 1.0000 mg | ORAL | Status: DC | PRN

## 2020-12-24 MED ORDER — BIOTENE DRY MOUTH MT LIQD: 15.0000 mL | OROMUCOSAL | Status: DC | PRN

## 2020-12-24 MED ORDER — FENTANYL CITRATE (PF) 250 MCG/5ML IJ SOLN: 0.5000 ug/kg/h | INTRAVENOUS | Status: DC

## 2020-12-24 MED ORDER — LORAZEPAM 2 MG/ML IJ SOLN
1.0000 mg | INTRAMUSCULAR | Status: DC | PRN
  Administered 2020-12-24 – 2020-12-25 (×2): 1 mg via INTRAVENOUS
  Filled 2020-12-24 (×2): qty 1

## 2020-12-24 MED ORDER — GLYCOPYRROLATE 0.2 MG/ML IJ SOLN
0.2000 mg | INTRAMUSCULAR | Status: DC | PRN
  Administered 2020-12-24: 0.2 mg via INTRAVENOUS
  Filled 2020-12-24: qty 1

## 2020-12-24 MED ORDER — SODIUM CHLORIDE 0.9 % IV SOLN
12.5000 mg | Freq: Four times a day (QID) | INTRAVENOUS | Status: DC | PRN
  Filled 2020-12-24: qty 0.5

## 2020-12-24 MED ORDER — FENTANYL BOLUS VIA INFUSION
20.0000 ug | INTRAVENOUS | Status: AC | PRN
Start: 2020-12-24 — End: 2020-12-26
  Administered 2020-12-25: 20 ug via INTRAVENOUS
  Filled 2020-12-24: qty 20

## 2020-12-24 MED ORDER — ENOXAPARIN SODIUM 40 MG/0.4ML IJ SOSY: 40.0000 mg | PREFILLED_SYRINGE | INTRAMUSCULAR | Status: DC

## 2020-12-24 MED ORDER — ACETAMINOPHEN 325 MG PO TABS: 650.0000 mg | ORAL_TABLET | ORAL | Status: DC | PRN

## 2020-12-24 MED ORDER — ONDANSETRON 4 MG PO TBDP: 4.0000 mg | ORAL_TABLET | Freq: Four times a day (QID) | ORAL | Status: DC | PRN

## 2020-12-24 MED ORDER — GLYCOPYRROLATE 1 MG PO TABS
1.0000 mg | ORAL_TABLET | ORAL | Status: DC | PRN
  Filled 2020-12-24: qty 1

## 2020-12-24 NOTE — Consult Note (Signed)
Reason for Consult: Code stroke Referring Physician: Dr Carmon Ginsberg is an 87 y.o. female.  HPI: Jennifer Atkinson is a 87 year old Caucasian lady with past medical history of CKD, GERD, hypertension, peripheral vascular disease and TIA who was brought in as a code stroke by EMS.  The patient last spoke to her daughter at 6 PM last night and was fine.  This morning when it was time to take the patient to doctor's appointment the daughter been in the bed and wedged between the bed and the nightstand.  She is independent and lives at home alone does checking on her daily.  She can manage her activities of daily living.  Patient was found to have left hemiplegia with right gaze deviation.  NIH stroke scale on admission by stroke team was 25.  Patient was found to be confused disoriented she is not following commands consistently, blinking to threat on the left facial droop significant left weakness with some neglect.  CT scan of the head was obtained which showed low-density throughout right PCA distribution suggestive of acute to subacute infarct.  CT angiogram was obtained with some difficulty in obtaining IV and showed fusion of the right posterior cerebral artery in the P1 distribution.  CT perfusion scan was obtained it surprisingly did not show any core infarct and was felt to be pseudonormalization, slow.  Calculated mismatch volume of 153 mL was felt to be spuriously due to pseudonormalization of flow in the right PCA.  There is also advanced intracranial atherosclerotic changes involving multiple vessels.  I discussed this case with the patient's daughter over the phone and subsequently after family arrived with multiple family members at the bedside and discussed risk benefit of thrombectomy.  Given patient's advanced age, large size of infarct already noted on the CT scan I do not believe she was a candidate for mechanical thrombectomy.  I also discussed the case with the neuro interventionist on-call  Dr. Earleen Newport who agreed with my assessment.  The patient had been quite clear to the family that she would not want to live with the life of disability in the nursing home to pursue feeding tube and the breathing tube.  Patient was made DNR and family agreed to palliative care and comfort care measures.  Last seen normal 6 PM on 12/23/2020 NIH stroke scale on admission 25 Premorbid modified Rankin scale 1 IV tPA considered : No as patient presented outside window Mechanical thrombectomy : No due to large infarct noted on baseline CT and low aspect score Past Medical History:  Diagnosis Date  . Chronic kidney disease    Stage 2  . GERD (gastroesophageal reflux disease)   . Hyperlipidemia   . Hypertension   . Peripheral vascular disease ([place]) 5/13, 8/14   Rt SFA PTA with ISR   . PONV (postoperative nausea and vomiting)    "q time I had a baby I was sick when I woke up; doesn't happen anymore" (04/08/2013)  . Stroke [facility])    TIA  . SUI (stress urinary incontinence, female)    Recent Macroplastique  . Syncopal episodes 2012   WHILE TAKING NIACIN PT STATES FAINTING    Past Surgical History:  Procedure Laterality Date  . ABDOMINAL HYSTERECTOMY  1980's  . APPENDECTOMY  1980's   "w/hysterectomy"  . BREAST BIOPSY Right ?1980's   X 2; " benign masses"  . CARDIOVASCULAR STRESS TEST  01/16/2012   Small. mild intensity mostly fixed septal defect which likely represents artifact. Post-stress EF  76%. No significant abnormalities noted. No lexiscan EKG changes. Non-diagnostic for ischemia.  [place] DILATION AND CURETTAGE OF UTERUS  1980's  . FRACTURE SURGERY    . LOWER EXTREMITY ANGIOGRAM N/A 01/25/2012   Procedure: LOWER EXTREMITY ANGIOGRAM;  Surgeon: [name], MD;  Location: [facility] C[facility] LAB;  Service: Cardiovascular;  Laterality: N/A;  . LOWER EXTREMITY ANGIOGRAM Right 04/08/2013   Procedure: LOWER EXTREMITY ANGIOGRAM;  Surgeon: [name], MD;  Location: [facility] CATH LAB;  Service: Cardiovascular;   Laterality: Right;  . LOWER EXTREMITY ANGIOGRAM Right 01/05/2014   Procedure: LOWER EXTREMITY ANGIOGRAM;  Surgeon: [name], MD;  Location: [facility] CATH LAB;  Service: Cardiovascular;  Laterality: Right;  . LOWER EXTREMITY ARTERIAL DOPPLER Bilateral 12/02/2012   Right SFA stent-small amount of trickled flow which is consistent w/ greater than 90% diameter reduction with 2 vessel runoff. Left SFA demonstrated mild mixed density plaque not significant with 2 vessel runoff.  . ORIF ANKLE FRACTURE     right; "had 5 surgeries"  . PERCUTANEOUS STENT INTERVENTION  01/25/2012   Procedure: PERCUTANEOUS STENT INTERVENTION;  Surgeon: [name], MD;  Location: [facility] CATH LAB;  Service: Cardiovascular;;  . PERIPHERAL ARTERIAL STENT GRAFT Right 01/25/12; 04/08/2013   SFA; redo (04/08/2013)  . PERIPHERAL VASCULAR ANGIOGRAM  01/25/2012   Moderately severe segmental right SFA 60-70% stenosis using a 6x150mm Cordis Nitinol Smart self-expanding stent, post-dilatation with a 5x43mm balloon, resulting in reduction of 60-70% stenosis to 0% residual excellent flow.    Family History  Problem Relation Age of Onset  . Diabetes Father   . Hypertension Father   . Stroke Father     Social History:  reports that she has never smoked. She has never used smokeless tobacco. She reports current alcohol use of about 4.0 standard drinks of alcohol per week. She reports that she does not use drugs.  Allergies:  Allergies  Allergen Reactions  . Lipitor [Atorvastatin]     Pain in the back of her calves  . Morphine Other (See Comments)    Other Reaction: GI Upset  . Niacin Other (See Comments)    Other Reaction: Other reaction  . Nsaids     Increased Creatinine  . Oxycodone-Acetaminophen Other (See Comments)    Other Reaction: Other reaction    Medications: I have reviewed the patient's current medications.  Results for orders placed or performed during the hospital encounter of 12/24/20 (from the past 48 hour(s))   Ethanol     Status: None   Collection Time: 12/24/20  2:34 PM  Result Value Ref Range   Alcohol, Ethyl (B) <10 <10 mg/dL    Comment: (NOTE) Lowest detectable limit for serum alcohol is 10 mg/dL.  For medical purposes only. Performed at [facility], [place] [address] [place], [place] [address]   Protime-INR     Status: None   Collection Time: 12/24/20  2:34 PM  Result Value Ref Range   Prothrombin Time 12.7 11.4 - 15.2 seconds   INR 1.0 0.8 - 1.2    Comment: (NOTE) INR goal varies based on device and disease states. Performed at [facility], [place] [address], [place], [place] [address]   APTT     Status: None   Collection Time: 12/24/20  2:34 PM  Result Value Ref Range   aPTT 24 24 - 36 seconds    Comment: Performed at [place] [address] [place], [place] [address]  CBC  Status: Abnormal   Collection Time: 12/24/20  2:34 PM  Result Value Ref Range   WBC 15.6 (H) 4.0 - 10.5 K/uL   RBC 5.68 (H) 3.87 - 5.11 MIL/uL   Hemoglobin 17.6 (H) 12.0 - 15.0 g/dL   HCT 51.3 (H) 36.0 - 46.0 %   MCV 90.3 80.0 - 100.0 fL   MCH 31.0 26.0 - 34.0 pg   MCHC 34.3 30.0 - 36.0 g/dL   RDW 13.3 11.5 - 15.5 %   Platelets 271 150 - 400 K/uL   nRBC 0.0 0.0 - 0.2 %    Comment: Performed at [facility], [place] [address], [place], [place] [address]  Differential     Status: Abnormal   Collection Time: 12/24/20  2:34 PM  Result Value Ref Range   Neutrophils Relative % 87 %   Neutro Abs 13.6 (H) 1.7 - 7.7 K/uL   Lymphocytes Relative 8 %   Lymphs Abs 1.2 0.7 - 4.0 K/uL   Monocytes Relative 4 %   Monocytes Absolute 0.6 0.1 - 1.0 K/uL   Eosinophils Relative 0 %   Eosinophils Absolute 0.0 0.0 - 0.5 K/uL   Basophils Relative 0 %   Basophils Absolute 0.0 0.0 - 0.1 K/uL   Immature Granulocytes 1 %   Abs Immature Granulocytes 0.08 (H) 0.00 - 0.07 K/uL    Comment: Performed at [place] [address], [place], [place] [address]  Comprehensive metabolic panel      Status: Abnormal   Collection Time: 12/24/20  2:34 PM  Result Value Ref Range   Sodium 134 (L) 135 - 145 mmol/L   Potassium 3.3 (L) 3.5 - 5.1 mmol/L   Chloride 98 98 - 111 mmol/L   CO2 22 22 - 32 mmol/L   Glucose, Bld 130 (H) 70 - 99 mg/dL    Comment: Glucose reference range applies only to samples taken after fasting for at least 8 hours.   BUN 29 (H) 8 - 23 mg/dL   Creatinine, Ser 1.10 (H) 0.44 - 1.00 mg/dL   Calcium 9.4 8.9 - 10.3 mg/dL   Total Protein 7.5 6.5 - 8.1 g/dL   Albumin 4.4 3.5 - 5.0 g/dL   AST 63 (H) 15 - 41 U/L   ALT 24 0 - 44 U/L   Alkaline Phosphatase 62 38 - 126 U/L   Total Bilirubin 1.2 0.3 - 1.2 mg/dL   GFR, Estimated 45 (L) >60 mL/min    Comment: (NOTE) Calculated using the CKD-EPI Creatinine Equation (2021)    Anion gap 14 5 - 15    Comment: Performed at [place] [address] [place], [place] [address]  CK     Status: Abnormal   Collection Time: 12/24/20  2:34 PM  Result Value Ref Range   Total CK 2,491 (H) 38 - 234 U/L    Comment: Performed at [facility], [place] [address], [place],  [address]  I-stat chem 8, ED     Status: Abnormal   Collection Time: 12/24/20  2:49 PM  Result Value Ref Range   Sodium 136 135 - 145 mmol/L   Potassium 3.1 (L) 3.5 - 5.1 mmol/L   Chloride 99 98 - 111 mmol/L   BUN 34 (H) 8 - 23 mg/dL   Creatinine, Ser 0.90 0.44 - 1.00 mg/dL   Glucose, Bld 129 (H) 70 - 99 mg/dL    Comment: Glucose reference range applies only to samples taken after fasting for  at least 8 hours.   Calcium, Ion 1.00 (L) 1.15 - 1.40 mmol/L   TCO2 25 22 - 32 mmol/L   Hemoglobin 18.4 (H) 12.0 - 15.0 g/dL   HCT 54.0 (H) 36.0 - 46.0 %  Resp Panel by RT-PCR (Flu A&B, Covid) Nasopharyngeal Swab     Status: None   Collection Time: 12/24/20  2:54 PM   Specimen: Nasopharyngeal Swab; Nasopharyngeal(NP) swabs in vial transport medium  Result Value Ref Range   SARS Coronavirus 2 by RT PCR NEGATIVE NEGATIVE    Comment:  (NOTE) SARS-CoV-2 target nucleic acids are NOT DETECTED.  The SARS-CoV-2 RNA is generally detectable in upper respiratory specimens during the acute phase of infection. The lowest concentration of SARS-CoV-2 viral copies this assay can detect is 138 copies/mL. A negative result does not preclude SARS-Cov-2 infection and should not be used as the sole basis for treatment or other patient management decisions. A negative result may occur with  improper specimen collection/handling, submission of specimen other than nasopharyngeal swab, presence of viral mutation(s) within the areas targeted by this assay, and inadequate number of viral copies(<138 copies/mL). A negative result must be combined with clinical observations, patient history, and epidemiological information. The expected result is Negative.  Fact Sheet for Patients:  EntrepreneurPulse.com.au  Fact Sheet for Healthcare Providers:  IncredibleEmployment.be  This test is no t yet approved or cleared by the [place] FDA and  has been authorized for detection and/or diagnosis of SARS-CoV-2 by FDA under an Emergency Use Authorization (EUA). This EUA will remain  in effect (meaning this test can be used) for the duration of the COVID-19 declaration under Section 564(b)(1) of the Act, 21 U.S.C.section 360bbb-3(b)(1), unless the authorization is terminated  or revoked sooner.       Influenza A by PCR NEGATIVE NEGATIVE   Influenza B by PCR NEGATIVE NEGATIVE    Comment: (NOTE) The Xpert Xpress SARS-CoV-2/FLU/RSV plus assay is intended as an aid in the diagnosis of influenza from Nasopharyngeal swab specimens and should not be used as a sole basis for treatment. Nasal washings and aspirates are unacceptable for Xpert Xpress SARS-CoV-2/FLU/RSV testing.  Fact Sheet for Patients: EntrepreneurPulse.com.au  Fact Sheet for Healthcare  Providers: IncredibleEmployment.be  This test is not yet approved or cleared by the [place] FDA and has been authorized for detection and/or diagnosis of SARS-CoV-2 by FDA under an Emergency Use Authorization (EUA). This EUA will remain in effect (meaning this test can be used) for the duration of the COVID-19 declaration under Section 564(b)(1) of the Act, 21 U.S.C. section 360bbb-3(b)(1), unless the authorization is terminated or revoked.  Performed at [facility], [place] [address] [place], [place] [address]     DG Pelvis 1-2 Views  Result Date: 12/24/2020 CLINICAL DATA:  [place], anticoagulated EXAM: PELVIS - 1-2 VIEW COMPARISON:  None. FINDINGS: Supine frontal view of the pelvis was obtained. Excreted contrast is seen within the kidneys, ureters, and bladder, obscuring portions of the sacrum. No acute displaced fracture. Symmetrical bilateral hip osteoarthritis. Alignment is anatomic. Sacroiliac joints are normal. Postsurgical changes at the L4-5 level. IMPRESSION: 1. No acute displaced fracture. 2. Symmetrical bilateral hip osteoarthritis. Electronically Signed   By: Randa Ngo M.D.   On: 12/24/2020 16:01   DG Elbow Complete Left  Result Date: 12/24/2020 CLINICAL DATA:  [place], anticoagulated EXAM: LEFT ELBOW - COMPLETE 3+ VIEW COMPARISON:  None. FINDINGS: Frontal, oblique, lateral views of the left elbow are obtained. Evaluation is limited by suboptimal positioning and  portable technique. No displaced fracture, subluxation, or dislocation. Soft tissues appear unremarkable. IMPRESSION: 1. Unremarkable exam limited by suboptimal positioning. If pain persists, follow-up evaluation in the Radiology department may be useful to allow for better positioning and technique. Electronically Signed   By: Randa Ngo M.D.   On: 12/24/2020 16:05   CT C-SPINE NO CHARGE  Result Date: 12/24/2020 CLINICAL DATA:  Right PCA stroke.  Found on the ground. EXAM: CT CERVICAL  SPINE WITHOUT CONTRAST TECHNIQUE: Multidetector CT imaging of the cervical spine was performed without intravenous contrast. Multiplanar CT image reconstructions were also generated. COMPARISON:  CT studies earlier same day FINDINGS: Alignment: No traumatic malalignment. 2 mm degenerative anterolisthesis C5-6. Skull base and vertebrae: No regional fracture. Soft tissues and spinal canal: No compressive stenosis of the canal. Soft tissues of the region appear unremarkable. Disc levels: C1-2: Ordinary osteoarthritis. No significant stenosis. C2-3: Mild facet osteoarthritis on the left. No significant stenosis. C3-4: Mild spondylosis and facet arthritis. No significant stenosis. C4-5: Facet arthritis worse on the right. Mild right foraminal narrowing. C5-6: 2 mm degenerative anterolisthesis. Facet osteoarthritis worse on the right. No compressive stenosis. C6-7: Disc space narrowing with endplate osteophytes. No compressive stenosis. C7-T1: No compressive stenosis. T1-2: Normal interspace. Upper chest: Benign appearing pleuroparenchymal scarring at the apices. Other: None IMPRESSION: No acute or traumatic finding. Chronic degenerative spondylosis and facet arthropathy as outlined above. Electronically Signed   By: Nelson Chimes M.D.   On: 12/24/2020 15:49   DG Chest Portable 1 View  Result Date: 12/24/2020 CLINICAL DATA:  [place], anticoagulated EXAM: PORTABLE CHEST 1 VIEW COMPARISON:  07/16/2018 FINDINGS: Single frontal view of the chest was obtained, with portions of the left lung base excluded by collimation. Cardiac silhouette is unremarkable. No airspace disease, effusion, or pneumothorax. No acute displaced fractures. IMPRESSION: 1. No acute intrathoracic process. Electronically Signed   By: Randa Ngo M.D.   On: 12/24/2020 16:00   CT HEAD CODE STROKE WO CONTRAST  Result Date: 12/24/2020 CLINICAL DATA:  Code stroke. Patient found on the ground. Stroke suspected. EXAM: CT HEAD WITHOUT CONTRAST TECHNIQUE:  Contiguous axial images were obtained from the base of the skull through the vertex without intravenous contrast. COMPARISON:  MRI 07/20/2015 FINDINGS: Brain: Low-density throughout the right PCA territory affecting the posteromedial temporal lobe, occipital lobe and right thalamus consistent with acute right PCA infarction. No evidence of hemorrhage. Elsewhere, there chronic small-vessel ischemic changes throughout the pons, thalami, basal ganglia and cerebral hemispheric white matter. No sign of mass, hemorrhage, hydrocephalus or extra-axial collection. Vascular: There is atherosclerotic calcification of the major vessels at the base of the brain. Skull: Negative Sinuses/Orbits: Clear except for a tiny amount of fluid layering in the left maxillary sinus and left frontal sinus. Orbits negative. Other: None ASPECTS ([place] Stroke Program Early CT Score) - Ganglionic level infarction (caudate, lentiform nuclei, internal capsule, insula, M1-M3 cortex): 7 - Supraganglionic infarction (M4-M6 cortex): 3 Total score (0-10 with 10 being normal): 10 IMPRESSION: 1. Acute right PCA territory infarction affecting the posteromedial temporal lobe, occipital lobe and right thalamus. No evidence of hemorrhage. 2. ASPECTS is 10. 3. Extensive chronic small-vessel ischemic changes throughout the brain elsewhere as outlined above. 4. These results were communicated to Dr. Quinn Axe At 2:53 pmon 4/29/2022by text page via the [facility] messaging system. Electronically Signed   By: Nelson Chimes M.D.   On: 12/24/2020 14:54   CT ANGIO HEAD NECK W WO CM W PERF (CODE STROKE)  Result Date: 12/24/2020 CLINICAL DATA:  Patient found on the ground. Acute right PCA territory stroke. EXAM: CT ANGIOGRAPHY HEAD AND NECK CT PERFUSION BRAIN TECHNIQUE: Multidetector CT imaging of the head and neck was performed using the standard protocol during bolus administration of intravenous contrast. Multiplanar CT image reconstructions and MIPs were obtained to  evaluate the vascular anatomy. Carotid stenosis measurements (when applicable) are obtained utilizing NASCET criteria, using the distal internal carotid diameter as the denominator. Multiphase CT imaging of the brain was performed following IV bolus contrast injection. Subsequent parametric perfusion maps were calculated using RAPID software. CONTRAST:  149mL OMNIPAQUE IOHEXOL 350 MG/ML SOLN COMPARISON:  None. Head CT earlier same day. FINDINGS: CTA NECK FINDINGS Aortic arch: Aortic atherosclerotic calcification. Branching pattern is normal without origin stenosis. Right carotid system: Common carotid artery widely patent to the bifurcation. Minimal atherosclerotic plaque at the carotid bifurcation but without stenosis. Cervical ICA widely patent. Left carotid system: Common carotid artery widely patent to the bifurcation. Minimal atherosclerotic plaque at carotid bifurcation but no stenosis. Cervical ICA is widely patent. Vertebral arteries: Both vertebral artery origins are widely patent. Both vertebral arteries are approximately equal in size and appear widely patent through the cervical region to the foramen magnum. Skeleton: Ordinary cervical spondylosis and facet osteoarthritis. Other neck: No mass or lymphadenopathy. Upper chest: Benign appearing pleural and parenchymal scarring at both apices. Review of the MIP images confirms the above findings CTA HEAD FINDINGS Anterior circulation: Both internal carotid arteries are patent through the skull base and siphon regions. Ordinary mild siphon atherosclerotic calcification. Mild atherosclerotic irregularity and stenosis of both supraclinoid internal carotid arteries. On the left, the anterior and middle cerebral arteries show some atherosclerotic irregularity but are sufficiently patent. Distal vessel branch vessel atherosclerosis is seen. On the right, there is more advanced atherosclerotic irregularity of the M1 segment with stenosis estimated at 50 %. More  distal branch vessels show considerable atherosclerotic irregularity. Posterior circulation: Both vertebral arteries are patent through the foramen magnum to the basilar. The basilar artery shows atherosclerotic irregularity but without correctable stenosis. There is advanced atherosclerotic disease affecting both posterior cerebral artery territories. Flow cannot be seen in the right PCA beyond the P1 segment. Left PCA branch vessels show advanced atherosclerotic irregularity with multiple stenoses. Venous sinuses: Filling defect within the right transverse sinus, not apparently due to simple mixing. This is favored to represent nonocclusive right transverse sinus thrombus. Anatomic variants: None significant. Review of the MIP images confirms the above findings CT Brain Perfusion Findings: ASPECTS: 10 CBF (<30%) Volume: 58mL detected by parameters. This indicates pseudo normalization of flow in the right PCA territory region stroke. No sign of insult in the MCA territory. Perfusion (Tmax>6.0s) volume: 162mL, a large portion of which is artifactual but some of which relates to the right PCA region infarction. No sign of insult in the MCA territory. Mismatch Volume: 157mL calculated. Up believe this is a spurious failure related to the fact that there is pseudo normalization of flow in the right PCA territory stroke as well as a good bit of artifactual inclusion. Infarction Location:Right PCA territory.  See above. IMPRESSION: 1. Advanced atherosclerotic disease of the aortic arch. Minimal atherosclerotic change at both carotid bifurcations but no stenosis. 2. Advanced intracranial atherosclerotic disease affecting both posterior cerebral artery territories. Occlusion the right PCA beyond the P1 segment. Pronounced atherosclerotic irregularity of the distal PCA branches on left. 3. Advanced atherosclerotic disease of the ACA and MCA branch vessels diffusely. No flow limiting proximal stenosis. Narrowing in the right  M1  segment approaching 50%. 4. Nonocclusive thrombus in the right transverse sinus. 5. Perfusion study shows pseudo normalization flow in the right PCA territory and adjacent brain. Penumbra is artifactually elevated because the program does not recognize the PCA stroke. There is also some white matter hypoperfusion related to the severe atherosclerotic medium to small vessel disease rather than acute embolic disease. Aortic Atherosclerosis (ICD10-I70.0). Electronically Signed   By: Nelson Chimes M.D.   On: 12/24/2020 15:47       ROS see as documented above in history of presenting illness otherwise all other 14 systems negative Blood pressure (!) 148/73, pulse (!) 54, temperature (!) 97.3 F (36.3 C), temperature source Oral, resp. rate (!) 21, height 5\' 7"  (1.702 m), weight 71.6 kg, SpO2 98 %. Physical Exam Frail elderly Caucasian lady not in distress.  She has abrasion left forearm. . Afebrile. Head is nontraumatic. Neck is supple without bruit.    Cardiac exam no murmur or gallop. Lungs are clear to auscultation. Distal pulses are well felt. Neurological Exam :  Patient is awake alert.  She is disoriented x3.  She is able to speak few words and occasional short sentences.  She does not follow commands consistently.  She has forced right gaze deviation and is not able to look to the left.  She blinks to threat on the right but not on the left.  There is left facial weakness and lower face.  Tongue midline.  Dense left hemiplegia there is trace withdrawal in left lower extremity occasionally controlled with spontaneously but not to command.  She has purposeful spontaneous antigravity movement in the right side. Interval: Initial (04/29 1515) Level of Consciousness (1a.)   : Alert, keenly responsive (04/29 1515) LOC Questions (1b. )   +: Answers neither question correctly (04/29 1515) LOC Commands (1c. )   + : Performs neither task correctly (04/29 1515) Best Gaze (2. )  +: Forced deviation (04/29  1515) Visual (3. )  +: Complete hemianopia (04/29 1515) Facial Palsy (4. )    : Minor paralysis (04/29 1515) Motor Arm, Left (5a. )   +: No effort against gravity (04/29 1515) Motor Arm, Right (5b. )   +: No drift (04/29 1515) Motor Leg, Left (6a. )   +: No movement (04/29 1515) Motor Leg, Right (6b. )   +: No effort against gravity (04/29 1515) Limb Ataxia (7. ): Absent (04/29 1515) Sensory (8. )   +: Severe to total sensory loss, patient is not aware of being touched in the face, arm, and leg (04/29 1515) Best Language (9. )   +: Mild-to-moderate aphasia (04/29 1515) Dysarthria (10. 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  She has presented within 24 hours but is not a candidate for mechanical thrombectomy given large baseline infarct noted on admission CT.  He is presented outside time but before tPA Plan: I had a long discussion with the patient's 2 daughters and his granddaughter about family members regarding poor prognosis given her advanced age large size of infarct is unlikely to benefit from mechanical  thrombectomy as she is a very high risk for hemorrhagic transformation should probably eat angioplasty and stenting due to her intracranial stenosis.  Patient has clearly expressed to [place] that she would not want to live a life of disability or require 24-hour nursing care which seems unavoidable during her last stroke.  Family hence agrees to DNR and hospice care.  Discussed with Dr. Fuller Plan.  I do not believe further stroke work-up is necessary.  Palliative care approach would be appropriate. This patient is critically ill and at significant risk of neurological worsening, death and care requires constant monitoring of vital signs, hemodynamics,respiratory and cardiac monitoring, extensive review of multiple databases, frequent neurological assessment, discussion with family, other specialists and medical decision making of high complexity.I have made any additions or clarifications directly to the above note.This critical care time does not reflect procedure time, or teaching time or supervisory time of PA/NP/Med Resident etc but could involve care discussion time.  I spent 50 minutes of neurocritical care time  in the care of  this patient.      Antony Contras 12/24/2020, 5:36 PM    Note: This document was prepared with digital dictation and possible smart phrase technology. Any transcriptional errors that result from this process are unintentional.

## 2020-12-24 NOTE — Progress Notes (Signed)
Pt complaining of urge to urinate with no success, bladder distended, bladder scan performed, amount shown 420 mL. MD messaged, new orders to place foley catheter placed, 14 Fr. latex catheter placed using sterile technique, clear yellow urine returned, documented.

## 2020-12-24 NOTE — ED Provider Notes (Signed)
[facility] EMERGENCY DEPARTMENT Provider Note   CSN: [id number] Arrival date & time: 12/24/20  1434     History Chief Complaint  Patient presents with  . Fall  . Altered Mental Status    Jennifer Atkinson is a 87 y.o. female presenting for evaluation after a presumed fall.  Level 5 caveat due to acuity of condition and limited history provided by patient.  Per EMS, patient was last spoken to by the daughter at 6 PM yesterday, she was in good health at that time.  She went to her doctors appointments as per normal yesterday.  When daughter found patient today, she was laying on the floor, mostly under the bed and wedged between the bed and the nightstand.  Patient lives at home by herself, performs her own ADLs normally.  She is on Plavix.  Additional history obtained from chart review.  History of CKD, GERD, hypertension, PVD on Plavix, TIA.   HPI     Past Medical History:  Diagnosis Date  . Chronic kidney disease    Stage 2  . GERD (gastroesophageal reflux disease)   . Hyperlipidemia   . Hypertension   . Peripheral vascular disease ([place]) 5/13, 8/14   Rt SFA PTA with ISR   . PONV (postoperative nausea and vomiting)    "q time I had a baby I was sick when I woke up; doesn't happen anymore" (04/08/2013)  . Stroke [facility])    TIA  . SUI (stress urinary incontinence, female)    Recent Macroplastique  . Syncopal episodes 2012   WHILE TAKING NIACIN PT STATES FAINTING    Patient Active Problem List   Diagnosis Date Noted  . CKD (chronic kidney disease), stage III ([place]) 08/11/2015  . Encephalopathy, hypertensive 08/11/2015  . TIA (transient ischemic attack) 07/20/2015  . Claudication ([place]) 01/05/2014  . Hypothyroid 04/09/2013  . PVD, Rt SFA May 2013, Aug 2014, May 2015 (IS[facility]) 01/25/2012  . CKD (chronic kidney disease) stage 3, GFR 30-59 ml/min ([facility]) 01/25/2012  . HTN (hypertension) 01/25/2012  . Dyslipidemia 01/25/2012    Past Surgical History:   Procedure Laterality Date  . ABDOMINAL HYSTERECTOMY  1980's  . APPENDECTOMY  1980's   "w/hysterectomy"  . BREAST BIOPSY Right ?1980's   X 2; " benign masses"  . CARDIOVASCULAR STRESS TEST  01/16/2012   Small. mild intensity mostly fixed septal defect which likely represents artifact. Post-stress EF 76%. No significant abnormalities noted. No lexiscan EKG changes. Non-diagnostic for ischemia.  Marland Kitchen DILATION AND CURETTAGE OF UTERUS  1980's  . FRACTURE SURGERY    . LOWER EXTREMITY ANGIOGRAM N/A 01/25/2012   Procedure: LOWER EXTREMITY ANGIOGRAM;  Surgeon: [name], MD;  Location: [facility] CATH LAB;  Service: Cardiovascular;  Laterality: N/A;  . LOWER EXTREMITY ANGIOGRAM Right 04/08/2013   Procedure: LOWER EXTREMITY ANGIOGRAM;  Surgeon: [name], MD;  Location: [facility] CATH LAB;  Service: Cardiovascular;  Laterality: Right;  . LOWER EXTREMITY ANGIOGRAM Right 01/05/2014   Procedure: LOWER EXTREMITY ANGIOGRAM;  Surgeon: [name], MD;  Location: [facility] CATH LAB;  Service: Cardiovascular;  Laterality: Right;  . LOWER EXTREMITY ARTERIAL DOPPLER Bilateral 12/02/2012   Right SFA stent-small amount of trickled flow which is consistent w/ greater than 90% diameter reduction with 2 vessel runoff. Left SFA demonstrated mild mixed density plaque not significant with 2 vessel runoff.  . ORIF ANKLE FRACTURE     right; "had 5 surgeries"  . PERCUTANEOUS STENT INTERVENTION  01/25/2012   Procedure: PERCUTANEOUS STENT  INTERVENTION;  Surgeon: [name], MD;  Location: [facility] CATH LAB;  Service: Cardiovascular;;  . PERIPHERAL ARTERIAL STENT GRAFT Right 01/25/12; 04/08/2013   SFA; redo (04/08/2013)  . PERIPHERAL VASCULAR ANGIOGRAM  01/25/2012   Moderately severe segmental right SFA 60-70% stenosis using a 6x128mm Cordis Nitinol Smart self-expanding stent, post-dilatation with a 5x15mm balloon, resulting in reduction of 60-70% stenosis to 0% residual excellent flow.     OB History   No obstetric history on file.      Family History  Problem Relation Age of Onset  . Diabetes Father   . Hypertension Father   . Stroke Father     Social History   Tobacco Use  . Smoking status: Never Smoker  . Smokeless tobacco: Never Used  Substance Use Topics  . Alcohol use: Yes    Alcohol/week: 4.0 standard drinks    Types: 4 Glasses of wine per week    Comment: socially  . Drug use: No    Home Medications Prior to Admission medications   Medication Sig Start Date End Date Taking? Authorizing Provider  ALPRAZolam Duanne Moron) 0.5 MG tablet Take 0.25-0.5 mg by mouth 2 (two) times daily as needed. 11/17/15   [provider]  amLODipine (NORVASC) 5 MG tablet Take 1 tablet (5 mg total) by mouth daily. 08/11/15   Rosalin Hawking, MD  aspirin 81 MG chewable tablet Chew 81 mg by mouth daily.    [provider]  calcium carbonate (OS-CAL) 600 MG TABS tablet Take 600 mg by mouth daily.    [provider]  clopidogrel (PLAVIX) 75 MG tablet Take 1 tablet by mouth once daily 06/08/20   [name], MD  fluorouracil (EFUDEX) 5 % cream fluorouracil 5 % topical cream  APPLY CREAM TOPICALLY TO AFFECTED AREA TWICE DAILY FOR 14 DAYS    [provider]  hydrochlorothiazide 25 MG tablet Take 25 mg by mouth daily.      [provider]  HYDROcodone-acetaminophen (NORCO/VICODIN) 5-325 MG per tablet Take 1-2 tablets by mouth every 4 (four) hours as needed for pain. 10/24/12   Jovita Gamma, MD  Lactobacillus (ACIDOPHILUS) CHEW Chew 1 tablet by mouth daily.    [provider]  levothyroxine (SYNTHROID, LEVOTHROID) 75 MCG tablet Take 75 mcg by mouth daily before breakfast.    [provider]  Misc Natural Products (OSTEO BI-FLEX ADV DOUBLE ST) TABS Take 1 tablet by mouth daily.    [provider]  Omega-3 Fatty Acids (FISH OIL) 1000 MG CAPS Take 1 capsule by mouth daily.    [provider]  omeprazole (PRILOSEC) 10 MG capsule Take 20 mg by mouth daily.      [provider]  pravastatin (PRAVACHOL) 40 MG tablet TAKE 1 TABLET BY MOUTH ONCE DAILY *NEED  OFFICE  VISIT* 12/29/19   [name], MD  prednisoLONE acetate (PRED FORTE) 1 % ophthalmic suspension prednisolone acetate 1 % eye drops,suspension  INSTILL 1 DROP INTO LEFT EYE TWICE DAILY. USE FOR 5 DAYS THEN STOP    [provider]  promethazine-codeine (PHENERGAN WITH CODEINE) 6.25-10 MG/5ML syrup promethazine 6.25 mg-codeine 10 mg/5 mL syrup  TAKE 5 ML BY MOUTH EVERY 4 HOURS AS NEEDED FOR COUGH    [provider]    Allergies    Lipitor [atorvastatin], Morphine, Niacin, Nsaids, and Oxycodone-acetaminophen  Review of Systems   Review of Systems  Unable to perform ROS: Acuity of condition  Hematological: Bruises/bleeds easily.    Physical Exam Updated Vital  Signs BP (!) 142/71   Pulse 74   Temp (!) 97.3 F (36.3 C) (Oral)   Resp (!) 27   Ht 5\' 7"  (1.702 m)   Wt 71.6 kg   SpO2 96%   BMI 24.72 kg/m   Physical Exam Vitals and nursing note reviewed.  Constitutional:      General: She is not in acute distress.    Appearance: She is well-developed.  HENT:     Head: Normocephalic.     Comments: Signs of trauma noted around the left eye Eyes:     Conjunctiva/sclera: Conjunctivae normal.     Pupils: Pupils are equal, round, and reactive to light.  Cardiovascular:     Rate and Rhythm: Normal rate and regular rhythm.     Pulses: Normal pulses.  Pulmonary:     Effort: Pulmonary effort is normal. No respiratory distress.     Breath sounds: Normal breath sounds. No wheezing.  Abdominal:     General: There is no distension.     Palpations: Abdomen is soft. There is no mass.     Tenderness: There is no abdominal tenderness. There is no guarding or rebound.  Musculoskeletal:        General: Normal range of motion.     Cervical back: Normal range of motion and neck supple.  Skin:    General: Skin is warm and dry.     Capillary Refill: Capillary  refill takes less than 2 seconds.  Neurological:     Mental Status: She is alert.     GCS: GCS eye subscore is 4. GCS verbal subscore is 5. GCS motor subscore is 6.     Comments: Rightward gaze deviation.  Weakness of the left upper and lower extremity.     ED Results / Procedures / Treatments   Labs (all labs ordered are listed, but only abnormal results are displayed) Labs Reviewed  CBC - Abnormal; Notable for the following components:      Result Value   WBC 15.6 (*)    RBC 5.68 (*)    Hemoglobin 17.6 (*)    HCT 51.3 (*)    All other components within normal limits  DIFFERENTIAL - Abnormal; Notable for the following components:   Neutro Abs 13.6 (*)    Abs Immature Granulocytes 0.08 (*)    All other components within normal limits  COMPREHENSIVE METABOLIC PANEL - Abnormal; Notable for the following components:   Sodium 134 (*)    Potassium 3.3 (*)    Glucose, Bld 130 (*)    BUN 29 (*)    Creatinine, Ser 1.10 (*)    AST 63 (*)    GFR, Estimated 45 (*)    All other components within normal limits  CK - Abnormal; Notable for the following components:   Total CK 2,491 (*)    All other components within normal limits  I-STAT CHEM 8, ED - Abnormal; Notable for the following components:   Potassium 3.1 (*)    BUN 34 (*)    Glucose, Bld 129 (*)    Calcium, Ion 1.00 (*)    Hemoglobin 18.4 (*)    HCT 54.0 (*)    All other components within normal limits  RESP PANEL BY RT-PCR (FLU A&B, COVID) ARPGX2  ETHANOL  PROTIME-INR  APTT  RAPID URINE DRUG SCREEN, HOSP PERFORMED  URINALYSIS, ROUTINE W REFLEX MICROSCOPIC    EKG None  Radiology CT HEAD CODE STROKE WO CONTRAST  Result Date: 12/24/2020 CLINICAL DATA:  Code stroke. Patient found on the ground. Stroke suspected. EXAM: CT HEAD WITHOUT CONTRAST TECHNIQUE: Contiguous axial images were obtained from the base of the skull through the vertex without intravenous contrast. COMPARISON:  MRI 07/20/2015 FINDINGS: Brain:  Low-density throughout the right PCA territory affecting the posteromedial temporal lobe, occipital lobe and right thalamus consistent with acute right PCA infarction. No evidence of hemorrhage. Elsewhere, there chronic small-vessel ischemic changes throughout the pons, thalami, basal ganglia and cerebral hemispheric white matter. No sign of mass, hemorrhage, hydrocephalus or extra-axial collection. Vascular: There is atherosclerotic calcification of the major vessels at the base of the brain. Skull: Negative Sinuses/Orbits: Clear except for a tiny amount of fluid layering in the left maxillary sinus and left frontal sinus. Orbits negative. Other: None ASPECTS ([place] Stroke Program Early CT Score) - Ganglionic level infarction (caudate, lentiform nuclei, internal capsule, insula, M1-M3 cortex): 7 - Supraganglionic infarction (M4-M6 cortex): 3 Total score (0-10 with 10 being normal): 10 IMPRESSION: 1. Acute right PCA territory infarction affecting the posteromedial temporal lobe, occipital lobe and right thalamus. No evidence of hemorrhage. 2. ASPECTS is 10. 3. Extensive chronic small-vessel ischemic changes throughout the brain elsewhere as outlined above. 4. These results were communicated to Dr. Quinn Axe At 2:53 pmon 4/29/2022by text page via the [facility] messaging system. Electronically Signed   By: Nelson Chimes M.D.   On: 12/24/2020 14:54    Procedures Procedures   Medications Ordered in ED Medications - No data to display  ED Course  I have reviewed the triage vital signs and the nursing notes.  Pertinent labs & imaging results that were available during my care of the patient were reviewed by me and considered in my medical decision making (see chart for details).  Clinical Course as of 12/24/20 1529  Fri Dec 24, 2020  1459 Handoff: Fall on plavix next to bed. Right gaze deviation and left sided weakness. LKN 6pm yesterday. 21 hours. At apts yesterday patient was normal. Home. [CC]    Clinical  Course User Index [CC] Tretha Sciara, MD   MDM Rules/Calculators/A&P                          Patient presenting for evaluation after presumed fall.  On exam, patient has acute neurologic deficits including rightward gaze deviation and weakness of the left upper and lower extremity.  Concern for stroke.  Also consider head trauma as patient is on blood thinners and has evidence of trauma.  Level 2 activated by EMS, I called code stroke due to meeting LVO criteria.  Patient to CT.  Pt signed out to Judye Bos, MD for f/u on labs, CT and likely plan for admit  Final Clinical Impression(s) / ED Diagnoses Final diagnoses:  Cerebrovascular accident (CVA), unspecified mechanism [facility])    Rx / DC Orders ED Discharge Orders    None       Franchot Heidelberg, PA-C 12/24/20 1533    Valarie Merino, MD 12/29/20 [phone]

## 2020-12-24 NOTE — ED Notes (Signed)
Delay in obtaining CT-angio and perfusion due to difficulty obtaining IV access.

## 2020-12-24 NOTE — H&P (Signed)
History and Physical    Jennifer Atkinson XBD:[id number] DOB: July 25, 1920 DOA: 12/24/2020  Referring MD/NP/PA: Isla Pence, MD PCP: Aurea Graff.Marlou Sa, MD  Patient coming from: Home via EMS  Chief Complaint: Fall  I have personally briefly reviewed patient's old medical records in [place]   HPI: Jennifer Atkinson is a 87 y.o. female with medical history significant of hypertension, hyperlipidemia, PVD, TIA, CKD stage II, and syncopal episodes presents after being found down at home.  History is obtained from the patient's family present at bedside.  He had last spoken to her around 6 PM yesterday afternoon.  After family did not hear from her today had gone to check up on her around 1 PM this afternoon and found her wedged halfway underneath her bed. Patient had been independent and has been still able to live alone.  Patient currently complains being cold and needing to use the bathroom.  Family discussed that the patient would not want to live in a nursing home or require feeding tube.  They state that the patient is to remain at DO NOT RESUSCITATE and DO NOT INTUBATE.  They would like to proceed with comfort care measures only keeping the patient comfortable.  Patient reportedly has an allergy to codeine and family would like her to be placed on a continuous drip for pain management.  ED Course: On admission to the emergency department patient was seen to be afebrile with respirations 15-27, all other vital signs stable.  CT scan of the head revealed an acute right PCA territory infarction affecting the posterior medial temporal lobe, occipital lobe, and right thalamus.  Patient was seen as a code stroke but out of window for tPA. Labs significant for WBC 15.6, hemoglobin 17.6, sodium 134, potassium 3.3, BUN 29, creatinine 1.1, AST 63, and CK 2491.  CT angiogram of the head and neck with perfusion was performed noting advanced intracranial atherosclerotic disease affecting both posterior cerebral  artery territories without core infarct and was felt to be pseudonormalization.  Dr. Leonie Man have neurology discussed with the patient's family that given her advanced intracranial atherosclerotic changes involving multiple vessels she is not a candidate for mechanical thrombectomy.  Case had been also discussed with Dr. Earleen Newport of neuro interventional radiology who agreed.     Review of Systems  Constitutional: Positive for chills.    Past Medical History:  Diagnosis Date  . Chronic kidney disease    Stage 2  . GERD (gastroesophageal reflux disease)   . Hyperlipidemia   . Hypertension   . Peripheral vascular disease ([place]) 5/13, 8/14   Rt SFA PTA with ISR   . PONV (postoperative nausea and vomiting)    "q time I had a baby I was sick when I woke up; doesn't happen anymore" (04/08/2013)  . Stroke [facility])    TIA  . SUI (stress urinary incontinence, female)    Recent Macroplastique  . Syncopal episodes 2012   WHILE TAKING NIACIN PT STATES FAINTING    Past Surgical History:  Procedure Laterality Date  . ABDOMINAL HYSTERECTOMY  1980's  . APPENDECTOMY  1980's   "w/hysterectomy"  . BREAST BIOPSY Right ?1980's   X 2; " benign masses"  . CARDIOVASCULAR STRESS TEST  01/16/2012   Small. mild intensity mostly fixed septal defect which likely represents artifact. Post-stress EF 76%. No significant abnormalities noted. No lexiscan EKG changes. Non-diagnostic for ischemia.  Marland Kitchen DILATION AND CURETTAGE OF UTERUS  1980's  . FRACTURE SURGERY    . LOWER  EXTREMITY ANGIOGRAM N/A 01/25/2012   Procedure: LOWER EXTREMITY ANGIOGRAM;  Surgeon: [name], MD;  Location[facility] CATH LAB;  Service: Cardiovascular;  Laterality: N/A;  . LOWER EXTREMITY ANGIOGRAM Right 04/08/2013   Procedure: LOWER EXTREMITY ANGIOGRAM;  Surgeon: [name], MD;  Location: [facility] CATH LAB;  Service: Cardiovascular;  Laterality: Right;  . LOWER EXTREMITY ANGIOGRAM Right 01/05/2014   Procedure: LOWER EXTREMITY ANGIOGRAM;  Surgeon:  [name], MD;  Location: [facility] CATH LAB;  Service: Cardiovascular;  Laterality: Right;  . LOWER EXTREMITY ARTERIAL DOPPLER Bilateral 12/02/2012   Right SFA stent-small amount of trickled flow which is consistent w/ greater than 90% diameter reduction with 2 vessel runoff. Left SFA demonstrated mild mixed density plaque not significant with 2 vessel runoff.  . ORIF ANKLE FRACTURE     right; "had 5 surgeries"  . PERCUTANEOUS STENT INTERVENTION  01/25/2012   Procedure: PERCUTANEOUS STENT INTERVENTION;  Surgeon: [name], MD;  Location[facility] CATH LAB;  Service: Cardiovascular;;  . PERIPHERAL ARTERIAL STENT GRAFT Right 01/25/12; 04/08/2013   SFA; redo (04/08/2013)  . PERIPHERAL VASCULAR ANGIOGRAM  01/25/2012   Moderately severe segmental right SFA 60-70% stenosis using a 6x114mm Cordis Nitinol Smart self-expanding stent, post-dilatation with a 5x13mm balloon, resulting in reduction of 60-70% stenosis to 0% residual excellent flow.     reports that she has never smoked. She has never used smokeless tobacco. She reports current alcohol use of about 4.0 standard drinks of alcohol per week. She reports that she does not use drugs.  Allergies  Allergen Reactions  . Lipitor [Atorvastatin]     Pain in the back of her calves  . Morphine Other (See Comments)    Other Reaction: GI Upset  . Niacin Other (See Comments)    Other Reaction: Other reaction  . Nsaids     Increased Creatinine  . Oxycodone-Acetaminophen Other (See Comments)    Other Reaction: Other reaction    Family History  Problem Relation Age of Onset  . Diabetes Father   . Hypertension Father   . Stroke Father     Prior to Admission medications   Medication Sig Start Date End Date Taking? Authorizing Provider  ALPRAZolam Duanne Moron) 0.5 MG tablet Take 0.25-0.5 mg by mouth 2 (two) times daily as needed. 11/17/15   [provider]  amLODipine (NORVASC) 5 MG tablet Take 1 tablet (5 mg total) by mouth daily. 08/11/15   Rosalin Hawking, MD  aspirin 81 MG chewable tablet Chew 81 mg by mouth daily.    [provider]  calcium carbonate (OS-CAL) 600 MG TABS tablet Take 600 mg by mouth daily.    [provider]  clopidogrel (PLAVIX) 75 MG tablet Take 1 tablet by mouth once daily 06/08/20   [name], MD  fluorouracil (EFUDEX) 5 % cream fluorouracil 5 % topical cream  APPLY CREAM TOPICALLY TO AFFECTED AREA TWICE DAILY FOR 14 DAYS    [provider]  hydrochlorothiazide 25 MG tablet Take 25 mg by mouth daily.      [provider]  HYDROcodone-acetaminophen (NORCO/VICODIN) 5-325 MG per tablet Take 1-2 tablets by mouth every 4 (four) hours as needed for pain. 10/24/12   Jovita Gamma, MD  Lactobacillus (ACIDOPHILUS) CHEW Chew 1 tablet by mouth daily.    [provider]  levothyroxine (SYNTHROID, LEVOTHROID) 75 MCG tablet Take 75 mcg by mouth daily before breakfast.    [provider]  Misc Natural Products (OSTEO BI-FLEX ADV DOUBLE ST) TABS Take  1 tablet by mouth daily.    [provider]  Omega-3 Fatty Acids (FISH OIL) 1000 MG CAPS Take 1 capsule by mouth daily.    [provider]  omeprazole (PRILOSEC) 10 MG capsule Take 20 mg by mouth daily.     [provider]  pravastatin (PRAVACHOL) 40 MG tablet TAKE 1 TABLET BY MOUTH ONCE DAILY *NEED  OFFICE  VISIT* 12/29/19   [name], MD  prednisoLONE acetate (PRED FORTE) 1 % ophthalmic suspension prednisolone acetate 1 % eye drops,suspension  INSTILL 1 DROP INTO LEFT EYE TWICE DAILY. USE FOR 5 DAYS THEN STOP    [provider]  promethazine-codeine (PHENERGAN WITH CODEINE) 6.25-10 MG/5ML syrup promethazine 6.25 mg-codeine 10 mg/5 mL syrup  TAKE 5 ML BY MOUTH EVERY 4 HOURS AS NEEDED FOR COUGH    [provider]    Physical Exam:  Constitutional: Elderly female currently in no acute distress with cervical collar present. Vitals:   12/24/20 1431 12/24/20 1445 12/24/20  1515 12/24/20 1526  BP:  138/70 (!) 142/71   Pulse:  70 74   Resp:  15 (!) 27   Temp: (!) 97.3 F (36.3 C)     TempSrc: Oral     SpO2:  96% 96%   Weight:    71.6 kg  Height:    5\' 7"  (1.702 m)   Eyes: Right-sided gaze ENMT: Mucous membranes are dry . Posterior pharynx clear of any exudate or lesions.  Neck: Patient in cervical collar. Respiratory: clear to auscultation bilaterally, no wheezing, no crackles. Normal respiratory effort. No accessory muscle use.  Cardiovascular: Irregular irregular, no murmurs / rubs / gallops. No extremity edema. 2+ pedal pulses. No carotid bruits.  Abdomen: no tenderness, no masses palpated. No hepatosplenomegaly. Bowel sounds positive.  Musculoskeletal: no clubbing / cyanosis. No joint deformity upper and lower extremities. Good ROM, no contractures. Normal muscle tone.  Skin: Significant bruising appreciated with large skin tear of the left elbow. Neurologic: CN 2-12 grossly intact.  Left-sided hemiparesis with sensory loss with mild aphasia and slurred speech. Psychiatric:.  Alert,  but not oriented to place and time.   Labs on Admission: I have personally reviewed following labs and imaging studies  CBC: Recent Labs  Lab 12/24/20 1434 12/24/20 1449  WBC 15.6*  --   NEUTROABS 13.6*  --   HGB 17.6* 18.4*  HCT 51.3* 54.0*  MCV 90.3  --   PLT 271  --    Basic Metabolic Panel: Recent Labs  Lab 12/24/20 1434 12/24/20 1449  NA 134* 136  K 3.3* 3.1*  CL 98 99  CO2 22  --   GLUCOSE 130* 129*  BUN 29* 34*  CREATININE 1.10* 0.90  CALCIUM 9.4  --    GFR: Estimated Creatinine Clearance: 33.9 mL/min (by C-G formula based on SCr of 0.9 mg/dL). Liver Function Tests: Recent Labs  Lab 12/24/20 1434  AST 63*  ALT 24  ALKPHOS 62  BILITOT 1.2  PROT 7.5  ALBUMIN 4.4   No results for input(s): LIPASE, AMYLASE in the last 168 hours. No results for input(s): AMMONIA in the last 168 hours. Coagulation Profile: Recent Labs  Lab  12/24/20 1434  INR 1.0   Cardiac Enzymes: Recent Labs  Lab 12/24/20 1434  CKTOTAL 2,491*   BNP (last 3 results) No results for input(s): PROBNP in the last 8760 hours. HbA1C: No results for input(s): HGBA1C in the last 72 hours. CBG: No results for input(s): GLUCAP in the last  168 hours. Lipid Profile: No results for input(s): CHOL, HDL, LDLCALC, TRIG, CHOLHDL, LDLDIRECT in the last 72 hours. Thyroid Function Tests: No results for input(s): TSH, T4TOTAL, FREET4, T3FREE, THYROIDAB in the last 72 hours. Anemia Panel: No results for input(s): VITAMINB12, FOLATE, FERRITIN, TIBC, IRON, RETICCTPCT in the last 72 hours. Urine analysis:    Component Value Date/Time   COLORURINE ORANGE (A) 12/14/2015 1238   APPEARANCEUR CLOUDY (A) 12/14/2015 1238   LABSPEC 1.029 12/14/2015 1238   PHURINE 6.0 12/14/2015 1238   GLUCOSEU NEGATIVE 12/14/2015 1238   HGBUR NEGATIVE 12/14/2015 1238   BILIRUBINUR SMALL (A) 12/14/2015 1238   KETONESUR NEGATIVE 12/14/2015 1238   PROTEINUR 30 (A) 12/14/2015 1238   UROBILINOGEN 0.2 03/04/2011 1735   NITRITE NEGATIVE 12/14/2015 1238   LEUKOCYTESUR SMALL (A) 12/14/2015 1238   Sepsis Labs: No results found for this or any previous visit (from the past 240 hour(s)).   Radiological Exams on Admission: CT HEAD CODE STROKE WO CONTRAST  Result Date: 12/24/2020 CLINICAL DATA:  Code stroke. Patient found on the ground. Stroke suspected. EXAM: CT HEAD WITHOUT CONTRAST TECHNIQUE: Contiguous axial images were obtained from the base of the skull through the vertex without intravenous contrast. COMPARISON:  MRI 07/20/2015 FINDINGS: Brain: Low-density throughout the right PCA territory affecting the posteromedial temporal lobe, occipital lobe and right thalamus consistent with acute right PCA infarction. No evidence of hemorrhage. Elsewhere, there chronic small-vessel ischemic changes throughout the pons, thalami, basal ganglia and cerebral hemispheric white matter. No sign  of mass, hemorrhage, hydrocephalus or extra-axial collection. Vascular: There is atherosclerotic calcification of the major vessels at the base of the brain. Skull: Negative Sinuses/Orbits: Clear except for a tiny amount of fluid layering in the left maxillary sinus and left frontal sinus. Orbits negative. Other: None ASPECTS ([place] Stroke Program Early CT Score) - Ganglionic level infarction (caudate, lentiform nuclei, internal capsule, insula, M1-M3 cortex): 7 - Supraganglionic infarction (M4-M6 cortex): 3 Total score (0-10 with 10 being normal): 10 IMPRESSION: 1. Acute right PCA territory infarction affecting the posteromedial temporal lobe, occipital lobe and right thalamus. No evidence of hemorrhage. 2. ASPECTS is 10. 3. Extensive chronic small-vessel ischemic changes throughout the brain elsewhere as outlined above. 4. These results were communicated to Dr. Quinn Axe At 2:53 pmon 4/29/2022by text page via the [facility] messaging system. Electronically Signed   By: Nelson Chimes M.D.   On: 12/24/2020 14:54    EKG: Independently reviewed.  Atrial fibrillation at 71 bpm  Assessment/Plan  CVA Left-sided hemiparesis Aphasia Atrial fibrillation Traumatic rhabdomyolysis  Hypokalemia Peripheral vascular disease Hypothyroidism Essential hypertension Chronic kidney disease stage IIIb DNR Care measures Jennifer Atkinson is a 87 year old female presenting after having a fall at home found on the floor.  Last known was around 6 PM last night talking to family.  Found this afternoon around 1 PM. Imaging studies revealed acute right PCA territory infarction affecting posterior medial temporal lobe, occipital lobe, and right thalamus.  Patient was out of the window for tPA and not a candidate for mechanical thrombectomy.  She does not have significant left-sided hemiparesis.  Family noted patient would not like to live significantly debilitated in a nursing home.  In the point that she is remain DNR and proceed with  comfort care measures only to give the patient comfortable.  Ultimately they would like to possibly be discharged home with hospice. - Admit to a MedSurg bed - Palliative care order set initiated - Discontinue cardiac monitoring - Routine vital sign checks - Discontinued  home medications - Speech therapy consulted to see if there is possibility of it being safe for patient to eat otherwise okay for ice chips - Okay for RN to pronounce death - Aspiration precautions - Maintain IV access - Nasal cannula oxygen as needed for comfort - Fentanyl drip with boluses as needed for pain - Zofran IV prn nausea/vomiting - Ativan IV prn anxiety - Haloperidol IV prn agitation or delirium - Glycopyrrolate prn excessive secretions - Albuterol prn wheezing   - Palliative care consulted, follow-up for any further recommendation - Transitions of care consulted for assistance with possibly transferring patient home with hospice    DVT prophylaxis: None Code Status: DNR Family Communication: Daughter and granddaughters updated at bedside Disposition Plan: Possible discharge home with hospice Consults called: Neurology Admission status: Inpatient, require more than 2 midnight stay due to CVA  Norval Morton MD [facility]   If 7PM-7AM, please contact night-coverage   12/24/2020, 3:38 PM

## 2020-12-24 NOTE — Code Documentation (Signed)
Stroke Response Nurse Documentation Code Documentation  Jennifer Atkinson is a 87 y.o. female arriving to [place] ED via [facility] on 12/24/2020 with past medical hx of CKD, HTN, HLP, and Stroke. Code stroke was activated by EMS. Patient from home where she was LKW at 12/23/2020 1800 and found down on the ground by family with right sided gaze and left sided weakness. On clopidogrel 75 mg daily.   Stroke team at the bedside on patient activation. Labs drawn and patient cleared for CT by Dr. Francia Greaves. Patient to CT with team. NIHSS 25, see documentation for details and code stroke times. Patient with disoriented, not following commands, right gaze preference , left hemianopia, left facial droop, left arm weakness, left leg weakness, left decreased sensation, Global aphasia , dysarthria  and Visual  neglect on exam. The following imaging was completed:  CT, CTA head and neck, CTP. Patient is not a candidate for tPA due to being outside window. MD noted PCA occlusion, but CT Head showed potential completion per MD Leonie Man. Due to this, patient is not a candidate for IR.    Care/Plan: q2 mNIHSS/VS and stroke work-up. Bedside handoff with ED RN Luiz Iron and [place].    Kathrin Greathouse  Stroke Response RN

## 2020-12-24 NOTE — ED Triage Notes (Addendum)
Pt BIB [facility] activated as a level 2 trauma as patient was arriving through the door. Pt fell sometime yesterday with an unknown down time. Pt's family last spoke with her around 1800 hrs last night with patient completely normal. Pt was found about an hour ago by family wedged halfway under the bed with room somewhat disheveled. Pt has contusion to left eye, rightward gaze, left elbow skin tear with bruising bilaterally to the legs. VSS per EMS. Sophia, PA and Dr. Francia Greaves at bedside.

## 2020-12-25 DIAGNOSIS — I10 Essential (primary) hypertension: Secondary | ICD-10-CM

## 2020-12-25 DIAGNOSIS — Z515 Encounter for palliative care: Secondary | ICD-10-CM | POA: Diagnosis not present

## 2020-12-25 DIAGNOSIS — N1832 Chronic kidney disease, stage 3b: Secondary | ICD-10-CM | POA: Diagnosis not present

## 2020-12-25 DIAGNOSIS — Z66 Do not resuscitate: Secondary | ICD-10-CM

## 2020-12-25 MED ORDER — DIPHENHYDRAMINE HCL 50 MG/ML IJ SOLN: 12.5000 mg | INTRAMUSCULAR | Status: DC | PRN

## 2020-12-25 MED ORDER — LORAZEPAM 2 MG/ML IJ SOLN
1.0000 mg | INTRAMUSCULAR | Status: DC
  Administered 2020-12-26 (×2): 1 mg via INTRAVENOUS
  Filled 2020-12-25 (×3): qty 1

## 2020-12-25 MED ORDER — FENTANYL CITRATE (PF) 100 MCG/2ML IJ SOLN: 25.0000 ug | INTRAMUSCULAR | Status: DC | PRN

## 2020-12-25 MED ORDER — FENTANYL 25 MCG/HR TD PT72
1.0000 | MEDICATED_PATCH | TRANSDERMAL | Status: DC
  Administered 2020-12-25: 1 via TRANSDERMAL
  Filled 2020-12-25: qty 1

## 2020-12-25 NOTE — Progress Notes (Signed)

## 2020-12-25 NOTE — Progress Notes (Signed)
Pt was agitated and restless Ativan 1 mg x 1 given with some good result

## 2020-12-25 NOTE — Consult Note (Signed)
Consultation Note Date: 12/25/2020   Patient Name: Jennifer Atkinson  DOB: 01-23-1920  MRN: [id number]  Age / Sex: 87 y.o., female  PCP: Alroy Dust, L.Marlou Sa, MD Referring Physician: Caren Griffins, MD  Reason for Consultation: end of life care  HPI/Patient Profile: 87 y.o. female  with past medical history of hypertension, hyperlipidemia, PVD, TIA, CKD stage II, and syncopal episodes presented to the emergency department on 12/24/2020 after being found down at home. Family had spoken to her last around 6pm the evening of 4/28. They went to check on her around 1 pm the next day and found her wedged halfway underneath the bed.  ED Course: CT scan of the head revealed an acute right PCA territory infarction affecting the posterior medial temporal lobe, occipital lobe, and right thalamus. Patient was a code stroke but was outside the window for tPA. CT angiogram of the head and neck with perfusion was performed noting advanced intracranial atherosclerotic disease affecting both posterior cerebral artery territories without core infarct and was felt to be pseudo-normalization. Neurologist discussed with family that given her advanced intracranial atherosclerotic changes involving multiple vessels she is not a candidate for mechanical thrombectomy.   Patient was transitioned to full comfort care on admission per family wishes.   Clinical Assessment and Goals of Care: 13:10--I have reviewed medical records including EPIC notes, labs and imaging, examined the patient and met at bedside with family  to discuss EOL wishes, disposition, and options. Patient has 4 children - 3 daughters and 1 son. Daughters [name] and [name] together are [employer].   I introduced Palliative Medicine as specialized medical care for people living with serious illness. It focuses on providing relief from the symptoms and stress of a serious illness.    Patient is currently on a fentanyl infusion at 20 mcg/hr. Family shares that patient had a "restless" night, requiring PRN ativan. Discussed that patient appears comfortable at this time. No non-verbal signs of pain or discomfort noted. Respirations are even and unlabored. No excessive respiratory secretions noted.    As far as functional and status, patient was living at home alone and independent  prior to admission. She had previously conveyed to her family that she would never want to live in a nursing home or be dependent on others for care.   We briefly reviewed her current medical condition. Provided education and counseling on expectations at EOL. Emotional support was provided.   Provided education and counseling on the philosophy and benefits of hospice care. Discussed that residential hospice care provides 24/7 nursing care, symptom management, and support for family. Family has requested patient by transferred to [facility] when a bed is available. Provided reassurance that transfer could be canceled at any time if patient became too unstable for transfer.   17:30--Patient likely transferring to [facility] and will need to be transitioned off fentanyl infusion. Returned to bedside to discuss plan with family. Patient has a documented allergy to morphine and oxycodone - family shares that her reaction severe vomiting. Recommend  starting transdermal fentanyl patch and using IV push fentanyl as needed for any breakthrough pain. Family agrees with plan of care.   Primary decisions maker: Daughters [name] and [name]    SUMMARY OF RECOMMENDATIONS    Continue full comfort measures  DNR/DNI as previously documented  Transfer to [facility] when a bed is available   Start transdermal fentanyl patch 25 mcg  Stop fentanyl infusion 6 hours after placing patch   Use IV push Fentanyl 25 mcg every 1 hour as needed for breakthrough pain  Code Status/Advance Care  Planning:  DNR  Symptom Management:   Lorazepam (ATIVAN) prn for anxiety  Haloperidol (HALDOL) prn for agitation   Glycopyrrolate (ROBINUL) for excessive secretions  Ondansetron (ZOFRAN) prn for nausea  Polyvinyl alcohol (LIQUIFILM TEARS) prn for dry eyes  Antiseptic oral rinse (BIOTENE) prn for dry mouth   Palliative Prophylaxis:   Oral Care and Turn Reposition  Additional Recommendations (Limitations, Scope, Preferences):  Full Comfort Care   Prognosis:   < 2 weeks  Discharge Planning: Hospice facility      Primary Diagnoses: Present on Admission: . CVA (cerebral vascular accident) ([place]) . Hypothyroid . HTN (hypertension) . DNR (do not resuscitate) . CKD (chronic kidney disease), stage III ([place]) . PVD, Rt SFA May 2013, Aug 2014, May 2015 (ISR)   I have reviewed the medical record, interviewed the patient and family, and examined the patient. The following aspects are pertinent.  Past Medical History:  Diagnosis Date  . Chronic kidney disease    Stage 2  . GERD (gastroesophageal reflux disease)   . Hyperlipidemia   . Hypertension   . Peripheral vascular disease ([place]) 5/13, 8/14   Rt SFA PTA with ISR   . PONV (postoperative nausea and vomiting)    "q time I had a baby I was sick when I woke up; doesn't happen anymore" (04/08/2013)  . Stroke [facility])    TIA  . SUI (stress urinary incontinence, female)    Recent Macroplastique  . Syncopal episodes 2012   WHILE TAKING NIACIN PT STATES FAINTING    Family History  Problem Relation Age of Onset  . Diabetes Father   . Hypertension Father   . Stroke Father    Scheduled Meds: Continuous Infusions: . chlorproMAZINE (THORAZINE) IV    . fentaNYL infusion INTRAVENOUS 20 mcg/hr (12/24/20 1907)   PRN Meds:.acetaminophen **OR** acetaminophen (TYLENOL) oral liquid 160 mg/5 mL **OR** acetaminophen, albuterol, antiseptic oral rinse, chlorproMAZINE (THORAZINE) IV, fentaNYL, glycopyrrolate **OR** glycopyrrolate  **OR** glycopyrrolate, haloperidol **OR** haloperidol **OR** haloperidol lactate, LORazepam **OR** LORazepam **OR** LORazepam, ondansetron **OR** ondansetron (ZOFRAN) IV, polyvinyl alcohol Medications Prior to Admission:  Prior to Admission medications   Medication Sig Start Date End Date Taking? Authorizing Provider  ALPRAZolam Duanne Moron) 0.5 MG tablet Take 0.25-0.5 mg by mouth 2 (two) times daily as needed for anxiety. 11/17/15  Yes [provider]  amLODipine (NORVASC) 5 MG tablet Take 1 tablet (5 mg total) by mouth daily. 08/11/15  Yes Rosalin Hawking, MD  Ascorbic Acid (VITAMIN C) 1000 MG tablet Take 500 mg by mouth daily.   Yes [provider]  aspirin 81 MG chewable tablet Chew 81 mg by mouth daily.   Yes [provider]  calcium carbonate (OS-CAL) 600 MG TABS tablet Take 600 mg by mouth daily.   Yes [provider]  clopidogrel (PLAVIX) 75 MG tablet Take 1 tablet by mouth once daily 06/08/20  Yes [name], MD  hydrochlorothiazide 25 MG tablet Take 25 mg  by mouth daily.   Yes [provider]  HYDROcodone-acetaminophen (NORCO/VICODIN) 5-325 MG per tablet Take 1-2 tablets by mouth every 4 (four) hours as needed for pain. 10/24/12  Yes Jovita Gamma, MD  Lactobacillus (ACIDOPHILUS) CHEW Chew 1 tablet by mouth daily.   Yes [provider]  levothyroxine (SYNTHROID, LEVOTHROID) 75 MCG tablet Take 75 mcg by mouth daily before breakfast.   Yes [provider]  Misc Natural Products (OSTEO BI-FLEX ADV DOUBLE ST) TABS Take 1 tablet by mouth daily.   Yes [provider]  Omega-3 Fatty Acids (FISH OIL) 1000 MG CAPS Take 1 capsule by mouth daily.   Yes [provider]  omeprazole (PRILOSEC) 10 MG capsule Take 20 mg by mouth daily.   Yes [provider]  pravastatin (PRAVACHOL) 40 MG tablet TAKE 1 TABLET BY MOUTH ONCE DAILY *NEED  OFFICE  VISIT* Patient taking differently: Take 40 mg by mouth daily. 12/29/19  Yes  [name], MD  fluorouracil (EFUDEX) 5 % cream Apply 1 application topically daily as needed (For rash).    [provider]   Allergies  Allergen Reactions  . Lipitor [Atorvastatin]     Pain in the back of her calves  . Morphine Other (See Comments)    Other Reaction: GI Upset  . Niacin Other (See Comments)    Other Reaction: Other reaction  . Nsaids     Increased Creatinine  . Oxycodone-Acetaminophen Other (See Comments)    Other Reaction: Other reaction    Physical Exam Vitals reviewed.  Constitutional:      General: She is sleeping.     Appearance: She is ill-appearing.  Pulmonary:     Effort: Pulmonary effort is normal.  Neurological:     Motor: Weakness present.     Vital Signs: BP 108/62 (BP Location: Left Arm)   Pulse 76   Temp 98 F (36.7 C)   Resp 18   Ht '5\' 7"'  (1.702 m)   Wt 71.6 kg   SpO2 92%   BMI 24.72 kg/m  Pain Scale: PAINAD   Pain Score: Asleep   SpO2: SpO2: 92 % O2 Device:SpO2: 92 % O2 Flow Rate: .   IO: Intake/output summary:   Intake/Output Summary (Last 24 hours) at 12/25/2020 1449 Last data filed at 12/25/2020 0600 Gross per 24 hour  Intake 23.76 ml  Output 500 ml  Net -476.24 ml    LBM: Last BM Date: 12/24/20 Baseline Weight: Weight: 71.6 kg Most recent weight: Weight: 71.6 kg      Palliative Assessment/Data: PPS 20%    Time In: 13:10 Time Out: 14:00 Time Total: 50 minutes Greater than 50%  of this time was spent counseling and coordinating care related to the above assessment and plan.  Signed by: Lavena Bullion, NP   Please contact Palliative Medicine Team phone at [phone] for questions and concerns.  For individual provider: See Shea Evans

## 2020-12-25 NOTE — TOC CAGE-AID Note (Signed)
Transition of Care [facility] - CAGE-AID Screening   Patient Details  Name: Jennifer Atkinson MRN: [id number] Date of Birth: 12/11/1919  Clinical Narrative:  Patient only oriented to self, transitioned to comfort care. Unable to participate in screening at this time.  CAGE-AID Screening: Substance Abuse Screening unable to be completed due to: : Patient unable to participate

## 2020-12-25 NOTE — Discharge Summary (Signed)
Physician Discharge Summary  Jennifer Atkinson GMW:[id number] DOB: [birth year] DOA: 12/24/2020  PCP: Alroy Dust, L.Marlou Sa, MD  Admit date: 12/24/2020 Discharge date: 12/25/2020  Admitted From: home Disposition:  [facility]  Discharge Condition: stable CODE STATUS: DNR Diet recommendation: comfort feeding  HPI: Per admitting MD, Jennifer Atkinson is a 87 y.o. female with medical history significant of hypertension, hyperlipidemia, PVD, TIA, CKD stage II, and syncopal episodes presents after being found down at home.  History is obtained from the patient's family present at bedside.  He had last spoken to her around 6 PM yesterday afternoon.  After family did not hear from her today had gone to check up on her around 1 PM this afternoon and found her wedged halfway underneath her bed. Patient had been independent and has been still able to live alone.  Patient currently complains being cold and needing to use the bathroom.  Family discussed that the patient would not want to live in a nursing home or require feeding tube.  They state that the patient is to remain at DO NOT RESUSCITATE and DO NOT INTUBATE.  They would like to proceed with comfort care measures only keeping the patient comfortable.  Patient reportedly has an allergy to codeine and family would like her to be placed on a continuous drip for pain management.  Hospital Course / Discharge diagnoses: Principal problem CVA with left hemiplegia and right gaze deviation -neurology consulted and evaluated patient.  A CT scan of the head was obtained which showed low-density throughout the right PCA distribution suggestive of acute to subacute infarct.  CT angiogram showed advanced intracranial atherosclerosis and occlusion of the right PCA beyond the P1 segment.  He also showed nonocclusive thrombus in the right transverse sinus.  She has advanced atherosclerotic disease of the ACA and MCA branch vessels diffusely without flow-limiting stenosis.   Neurology discussed with neuro IR as well as family, and given the patient's advanced age, large size of infarct she was not a candidate for mechanical thrombectomy.  Family opted for comfort measures at this point  Active problems Permanent atrial fibrillation Traumatic rhabdomyolysis  Hypokalemia Peripheral vascular disease Hypothyroidism Essential hypertension Chronic kidney disease stage IIIb DNR Leukocytosis GERD  Sepsis ruled out   Discharge Instructions   Allergies as of 12/25/2020      Reactions   Lipitor [atorvastatin]    Pain in the back of her calves   Morphine Other (See Comments)   Other Reaction: GI Upset   Niacin Other (See Comments)   Other Reaction: Other reaction   Nsaids    Increased Creatinine   Oxycodone-acetaminophen Other (See Comments)   Other Reaction: Other reaction      Medication List    STOP taking these medications   Acidophilus Chew   amLODipine 5 MG tablet Commonly known as: NORVASC   aspirin 81 MG chewable tablet   calcium carbonate 600 MG Tabs tablet Commonly known as: OS-CAL   clopidogrel 75 MG tablet Commonly known as: PLAVIX   Fish Oil 1000 MG Caps   fluorouracil 5 % cream Commonly known as: EFUDEX   hydrochlorothiazide 25 MG tablet Commonly known as: HYDRODIURIL   levothyroxine 75 MCG tablet Commonly known as: SYNTHROID   omeprazole 10 MG capsule Commonly known as: PRILOSEC   Osteo Bi-Flex Adv Double St Tabs   pravastatin 40 MG tablet Commonly known as: PRAVACHOL   vitamin C 1000 MG tablet     TAKE these medications   ALPRAZolam 0.5 MG  tablet Commonly known as: XANAX Take 0.25-0.5 mg by mouth 2 (two) times daily as needed for anxiety.   HYDROcodone-acetaminophen 5-325 MG tablet Commonly known as: NORCO/VICODIN Take 1-2 tablets by mouth every 4 (four) hours as needed for pain.      Consultations:  Neurology   Palliative care  Procedures/Studies:  DG Pelvis 1-2 Views  Result Date:  12/24/2020 CLINICAL DATA:  [place], anticoagulated EXAM: PELVIS - 1-2 VIEW COMPARISON:  None. FINDINGS: Supine frontal view of the pelvis was obtained. Excreted contrast is seen within the kidneys, ureters, and bladder, obscuring portions of the sacrum. No acute displaced fracture. Symmetrical bilateral hip osteoarthritis. Alignment is anatomic. Sacroiliac joints are normal. Postsurgical changes at the L4-5 level. IMPRESSION: 1. No acute displaced fracture. 2. Symmetrical bilateral hip osteoarthritis. Electronically Signed   By: Randa Ngo M.D.   On: 12/24/2020 16:01   DG Elbow Complete Left  Result Date: 12/24/2020 CLINICAL DATA:  [place], anticoagulated EXAM: LEFT ELBOW - COMPLETE 3+ VIEW COMPARISON:  None. FINDINGS: Frontal, oblique, lateral views of the left elbow are obtained. Evaluation is limited by suboptimal positioning and portable technique. No displaced fracture, subluxation, or dislocation. Soft tissues appear unremarkable. IMPRESSION: 1. Unremarkable exam limited by suboptimal positioning. If pain persists, follow-up evaluation in the Radiology department may be useful to allow for better positioning and technique. Electronically Signed   By: Randa Ngo M.D.   On: 12/24/2020 16:05   CT C-SPINE NO CHARGE  Result Date: 12/24/2020 CLINICAL DATA:  Right PCA stroke.  Found on the ground. EXAM: CT CERVICAL SPINE WITHOUT CONTRAST TECHNIQUE: Multidetector CT imaging of the cervical spine was performed without intravenous contrast. Multiplanar CT image reconstructions were also generated. COMPARISON:  CT studies earlier same day FINDINGS: Alignment: No traumatic malalignment. 2 mm degenerative anterolisthesis C5-6. Skull base and vertebrae: No regional fracture. Soft tissues and spinal canal: No compressive stenosis of the canal. Soft tissues of the region appear unremarkable. Disc levels: C1-2: Ordinary osteoarthritis. No significant stenosis. C2-3: Mild facet osteoarthritis on the left. No  significant stenosis. C3-4: Mild spondylosis and facet arthritis. No significant stenosis. C4-5: Facet arthritis worse on the right. Mild right foraminal narrowing. C5-6: 2 mm degenerative anterolisthesis. Facet osteoarthritis worse on the right. No compressive stenosis. C6-7: Disc space narrowing with endplate osteophytes. No compressive stenosis. C7-T1: No compressive stenosis. T1-2: Normal interspace. Upper chest: Benign appearing pleuroparenchymal scarring at the apices. Other: None IMPRESSION: No acute or traumatic finding. Chronic degenerative spondylosis and facet arthropathy as outlined above. Electronically Signed   By: Nelson Chimes M.D.   On: 12/24/2020 15:49   DG Chest Portable 1 View  Result Date: 12/24/2020 CLINICAL DATA:  [place], anticoagulated EXAM: PORTABLE CHEST 1 VIEW COMPARISON:  07/16/2018 FINDINGS: Single frontal view of the chest was obtained, with portions of the left lung base excluded by collimation. Cardiac silhouette is unremarkable. No airspace disease, effusion, or pneumothorax. No acute displaced fractures. IMPRESSION: 1. No acute intrathoracic process. Electronically Signed   By: Randa Ngo M.D.   On: 12/24/2020 16:00   CT HEAD CODE STROKE WO CONTRAST  Result Date: 12/24/2020 CLINICAL DATA:  Code stroke. Patient found on the ground. Stroke suspected. EXAM: CT HEAD WITHOUT CONTRAST TECHNIQUE: Contiguous axial images were obtained from the base of the skull through the vertex without intravenous contrast. COMPARISON:  MRI 07/20/2015 FINDINGS: Brain: Low-density throughout the right PCA territory affecting the posteromedial temporal lobe, occipital lobe and right thalamus consistent with acute right PCA infarction. No evidence of hemorrhage. Elsewhere,  there chronic small-vessel ischemic changes throughout the pons, thalami, basal ganglia and cerebral hemispheric white matter. No sign of mass, hemorrhage, hydrocephalus or extra-axial collection. Vascular: There is  atherosclerotic calcification of the major vessels at the base of the brain. Skull: Negative Sinuses/Orbits: Clear except for a tiny amount of fluid layering in the left maxillary sinus and left frontal sinus. Orbits negative. Other: None ASPECTS ([place] Stroke Program Early CT Score) - Ganglionic level infarction (caudate, lentiform nuclei, internal capsule, insula, M1-M3 cortex): 7 - Supraganglionic infarction (M4-M6 cortex): 3 Total score (0-10 with 10 being normal): 10 IMPRESSION: 1. Acute right PCA territory infarction affecting the posteromedial temporal lobe, occipital lobe and right thalamus. No evidence of hemorrhage. 2. ASPECTS is 10. 3. Extensive chronic small-vessel ischemic changes throughout the brain elsewhere as outlined above. 4. These results were communicated to Dr. Quinn Axe At 2:53 pmon 4/29/2022by text page via the [facility] messaging system. Electronically Signed   By: Nelson Chimes M.D.   On: 12/24/2020 14:54   CT ANGIO HEAD NECK W WO CM W PERF (CODE STROKE)  Result Date: 12/24/2020 CLINICAL DATA:  Patient found on the ground. Acute right PCA territory stroke. EXAM: CT ANGIOGRAPHY HEAD AND NECK CT PERFUSION BRAIN TECHNIQUE: Multidetector CT imaging of the head and neck was performed using the standard protocol during bolus administration of intravenous contrast. Multiplanar CT image reconstructions and MIPs were obtained to evaluate the vascular anatomy. Carotid stenosis measurements (when applicable) are obtained utilizing NASCET criteria, using the distal internal carotid diameter as the denominator. Multiphase CT imaging of the brain was performed following IV bolus contrast injection. Subsequent parametric perfusion maps were calculated using RAPID software. CONTRAST:  12mL OMNIPAQUE IOHEXOL 350 MG/ML SOLN COMPARISON:  None. Head CT earlier same day. FINDINGS: CTA NECK FINDINGS Aortic arch: Aortic atherosclerotic calcification. Branching pattern is normal without origin stenosis. Right  carotid system: Common carotid artery widely patent to the bifurcation. Minimal atherosclerotic plaque at the carotid bifurcation but without stenosis. Cervical ICA widely patent. Left carotid system: Common carotid artery widely patent to the bifurcation. Minimal atherosclerotic plaque at carotid bifurcation but no stenosis. Cervical ICA is widely patent. Vertebral arteries: Both vertebral artery origins are widely patent. Both vertebral arteries are approximately equal in size and appear widely patent through the cervical region to the foramen magnum. Skeleton: Ordinary cervical spondylosis and facet osteoarthritis. Other neck: No mass or lymphadenopathy. Upper chest: Benign appearing pleural and parenchymal scarring at both apices. Review of the MIP images confirms the above findings CTA HEAD FINDINGS Anterior circulation: Both internal carotid arteries are patent through the skull base and siphon regions. Ordinary mild siphon atherosclerotic calcification. Mild atherosclerotic irregularity and stenosis of both supraclinoid internal carotid arteries. On the left, the anterior and middle cerebral arteries show some atherosclerotic irregularity but are sufficiently patent. Distal vessel branch vessel atherosclerosis is seen. On the right, there is more advanced atherosclerotic irregularity of the M1 segment with stenosis estimated at 50 %. More distal branch vessels show considerable atherosclerotic irregularity. Posterior circulation: Both vertebral arteries are patent through the foramen magnum to the basilar. The basilar artery shows atherosclerotic irregularity but without correctable stenosis. There is advanced atherosclerotic disease affecting both posterior cerebral artery territories. Flow cannot be seen in the right PCA beyond the P1 segment. Left PCA branch vessels show advanced atherosclerotic irregularity with multiple stenoses. Venous sinuses: Filling defect within the right transverse sinus, not  apparently due to simple mixing. This is favored to represent nonocclusive right transverse sinus thrombus. Anatomic  variants: None significant. Review of the MIP images confirms the above findings CT Brain Perfusion Findings: ASPECTS: 10 CBF (<30%) Volume: 0mL detected by parameters. This indicates pseudo normalization of flow in the right PCA territory region stroke. No sign of insult in the MCA territory. Perfusion (Tmax>6.0s) volume: 153mL, a large portion of which is artifactual but some of which relates to the right PCA region infarction. No sign of insult in the MCA territory. Mismatch Volume: 153mL calculated. Up believe this is a spurious failure related to the fact that there is pseudo normalization of flow in the right PCA territory stroke as well as a good bit of artifactual inclusion. Infarction Location:Right PCA territory.  See above. IMPRESSION: 1. Advanced atherosclerotic disease of the aortic arch. Minimal atherosclerotic change at both carotid bifurcations but no stenosis. 2. Advanced intracranial atherosclerotic disease affecting both posterior cerebral artery territories. Occlusion the right PCA beyond the P1 segment. Pronounced atherosclerotic irregularity of the distal PCA branches on left. 3. Advanced atherosclerotic disease of the ACA and MCA branch vessels diffusely. No flow limiting proximal stenosis. Narrowing in the right M1 segment approaching 50%. 4. Nonocclusive thrombus in the right transverse sinus. 5. Perfusion study shows pseudo normalization flow in the right PCA territory and adjacent brain. Penumbra is artifactually elevated because the program does not recognize the PCA stroke. There is also some white matter hypoperfusion related to the severe atherosclerotic medium to small vessel disease rather than acute embolic disease. Aortic Atherosclerosis (ICD10-I70.0). Electronically Signed   By: Paulina FusiMark  Shogry M.D.   On: 12/24/2020 15:47     Subjective: - no chest pain, shortness  of breath, no abdominal pain, nausea or vomiting.   Discharge Exam: BP 108/62 (BP Location: Left Arm)   Pulse 76   Temp 98 F (36.7 C)   Resp 18   Ht 5\' 7"  (1.702 m)   Wt 71.6 kg   SpO2 92%   BMI 24.72 kg/m   General: unresponsive    The results of significant diagnostics from this hospitalization (including imaging, microbiology, ancillary and laboratory) are listed below for reference.     Microbiology: Recent Results (from the past 240 hour(s))  Resp Panel by RT-PCR (Flu A&B, Covid) Nasopharyngeal Swab     Status: None   Collection Time: 12/24/20  2:54 PM   Specimen: Nasopharyngeal Swab; Nasopharyngeal(NP) swabs in vial transport medium  Result Value Ref Range Status   SARS Coronavirus 2 by RT PCR NEGATIVE NEGATIVE Final    Comment: (NOTE) SARS-CoV-2 target nucleic acids are NOT DETECTED.  The SARS-CoV-2 RNA is generally detectable in upper respiratory specimens during the acute phase of infection. The lowest concentration of SARS-CoV-2 viral copies this assay can detect is 138 copies/mL. A negative result does not preclude SARS-Cov-2 infection and should not be used as the sole basis for treatment or other patient management decisions. A negative result may occur with  improper specimen collection/handling, submission of specimen other than nasopharyngeal swab, presence of viral mutation(s) within the areas targeted by this assay, and inadequate number of viral copies(<138 copies/mL). A negative result must be combined with clinical observations, patient history, and epidemiological information. The expected result is Negative.  Fact Sheet for Patients:  BloggerCourse.comhttps://www.fda.gov/media/152166/download  Fact Sheet for Healthcare Providers:  SeriousBroker.ithttps://www.fda.gov/media/152162/download  This test is no t yet approved or cleared by the [place] FDA and  has been authorized for detection and/or diagnosis of SARS-CoV-2 by FDA under an Emergency Use Authorization (EUA).  This EUA will remain  in  effect (meaning this test can be used) for the duration of the COVID-19 declaration under Section 564(b)(1) of the Act, 21 U.S.C.section 360bbb-3(b)(1), unless the authorization is terminated  or revoked sooner.       Influenza A by PCR NEGATIVE NEGATIVE Final   Influenza B by PCR NEGATIVE NEGATIVE Final    Comment: (NOTE) The Xpert Xpress SARS-CoV-2/FLU/RSV plus assay is intended as an aid in the diagnosis of influenza from Nasopharyngeal swab specimens and should not be used as a sole basis for treatment. Nasal washings and aspirates are unacceptable for Xpert Xpress SARS-CoV-2/FLU/RSV testing.  Fact Sheet for Patients: EntrepreneurPulse.com.au  Fact Sheet for Healthcare Providers: IncredibleEmployment.be  This test is not yet approved or cleared by the [place] FDA and has been authorized for detection and/or diagnosis of SARS-CoV-2 by FDA under an Emergency Use Authorization (EUA). This EUA will remain in effect (meaning this test can be used) for the duration of the COVID-19 declaration under Section 564(b)(1) of the Act, 21 U.S.C. section 360bbb-3(b)(1), unless the authorization is terminated or revoked.  Performed at [facility], [place] [address], [place], [place] [address]      Labs: Basic Metabolic Panel: Recent Labs  Lab 12/24/20 1434 12/24/20 1449  NA 134* 136  K 3.3* 3.1*  CL 98 99  CO2 22  --   GLUCOSE 130* 129*  BUN 29* 34*  CREATININE 1.10* 0.90  CALCIUM 9.4  --    Liver Function Tests: Recent Labs  Lab 12/24/20 1434  AST 63*  ALT 24  ALKPHOS 62  BILITOT 1.2  PROT 7.5  ALBUMIN 4.4   CBC: Recent Labs  Lab 12/24/20 1434 12/24/20 1449  WBC 15.6*  --   NEUTROABS 13.6*  --   HGB 17.6* 18.4*  HCT 51.3* 54.0*  MCV 90.3  --   PLT 271  --    CBG: No results for input(s): GLUCAP in the last 168 hours. Hgb A1c No results for input(s): HGBA1C in the last 72  hours. Lipid Profile No results for input(s): CHOL, HDL, LDLCALC, TRIG, CHOLHDL, LDLDIRECT in the last 72 hours. Thyroid function studies No results for input(s): TSH, T4TOTAL, T3FREE, THYROIDAB in the last 72 hours.  Invalid input(s): FREET3 Urinalysis    Component Value Date/Time   COLORURINE YELLOW 12/24/2020 [place] 12/24/2020 1438   LABSPEC 1.021 12/24/2020 1438   PHURINE 6.0 12/24/2020 1438   GLUCOSEU NEGATIVE 12/24/2020 1438   HGBUR NEGATIVE 12/24/2020 1438   [place] 12/24/2020 1438   [place] 12/24/2020 1438   PROTEINUR NEGATIVE 12/24/2020 1438   UROBILINOGEN 0.2 03/04/2011 1735   NITRITE NEGATIVE 12/24/2020 1438   LEUKOCYTESUR NEGATIVE 12/24/2020 1438    FURTHER DISCHARGE INSTRUCTIONS:   Get Medicines reviewed and adjusted: Please take all your medications with you for your next visit with your Primary MD   Laboratory/radiological data: Please request your Primary MD to go over all hospital tests and procedure/radiological results at the follow up, please ask your Primary MD to get all Hospital records sent to his/her office.   In some cases, they will be blood work, cultures and biopsy results pending at the time of your discharge. Please request that your primary care M.D. goes through all the records of your hospital data and follows up on these results.   Also Note the following: If you experience worsening of your admission symptoms, develop shortness of breath, life threatening emergency, suicidal or homicidal thoughts you must seek medical attention  immediately by calling 911 or calling your MD immediately  if symptoms less severe.   You must read complete instructions/literature along with all the possible adverse reactions/side effects for all the Medicines you take and that have been prescribed to you. Take any new Medicines after you have completely understood and accpet all the possible adverse reactions/side effects.     Do not drive when taking Pain medications or sleeping medications (Benzodaizepines)   Do not take more than prescribed Pain, Sleep and Anxiety Medications. It is not advisable to combine anxiety,sleep and pain medications without talking with your primary care practitioner   Special Instructions: If you have smoked or chewed Tobacco  in the last 2 yrs please stop smoking, stop any regular Alcohol  and or any Recreational drug use.   Wear Seat belts while driving.   Please note: You were cared for by a hospitalist during your hospital stay. Once you are discharged, your primary care physician will handle any further medical issues. Please note that NO REFILLS for any discharge medications will be authorized once you are discharged, as it is imperative that you return to your primary care physician (or establish a relationship with a primary care physician if you do not have one) for your post hospital discharge needs so that they can reassess your need for medications and monitor your lab values.  Time coordinating discharge: 40 minutes  SIGNED:  Marzetta Board, MD, PhD 12/25/2020, 1:24 PM

## 2020-12-25 NOTE — Evaluation (Addendum)
Clinical/Bedside Swallow Evaluation Patient Details  Name: Jennifer Atkinson MRN: [id number] Date of Birth: 1920-07-25  Today's Date: 12/25/2020 Time: SLP Start Time (ACUTE ONLY): 1010 SLP Stop Time (ACUTE ONLY): 1030 SLP Time Calculation (min) (ACUTE ONLY): 20 min  Past Medical History:  Past Medical History:  Diagnosis Date  . Chronic kidney disease    Stage 2  . GERD (gastroesophageal reflux disease)   . Hyperlipidemia   . Hypertension   . Peripheral vascular disease ([place]) 5/13, 8/14   Rt SFA PTA with ISR   . PONV (postoperative nausea and vomiting)    "q time I had a baby I was sick when I woke up; doesn't happen anymore" (04/08/2013)  . Stroke [facility]    TIA  . SUI (stress urinary incontinence, female)    Recent Macroplastique  . Syncopal episodes 2012   WHILE TAKING NIACIN PT STATES FAINTING   Past Surgical History:  Past Surgical History:  Procedure Laterality Date  . ABDOMINAL HYSTERECTOMY  1980's  . APPENDECTOMY  1980's   "w/hysterectomy"  . BREAST BIOPSY Right ?1980's   X 2; " benign masses"  . CARDIOVASCULAR STRESS TEST  01/16/2012   Small. mild intensity mostly fixed septal defect which likely represents artifact. Post-stress EF 76%. No significant abnormalities noted. No lexiscan EKG changes. Non-diagnostic for ischemia.  Marland Kitchen DILATION AND CURETTAGE OF UTERUS  1980's  . FRACTURE SURGERY    . LOWER EXTREMITY ANGIOGRAM N/A 01/25/2012   Procedure: LOWER EXTREMITY ANGIOGRAM;  Surgeon: [name], MD;  Location: [facility] CATH LAB;  Service: Cardiovascular;  Laterality: N/A;  . LOWER EXTREMITY ANGIOGRAM Right 04/08/2013   Procedure: LOWER EXTREMITY ANGIOGRAM;  Surgeon: [name], MD;  Location: [facility] CATH LAB;  Service: Cardiovascular;  Laterality: Right;  . LOWER EXTREMITY ANGIOGRAM Right 01/05/2014   Procedure: LOWER EXTREMITY ANGIOGRAM;  Surgeon: [name], MD;  Location: [facility] CATH LAB;  Service: Cardiovascular;  Laterality: Right;  . LOWER EXTREMITY ARTERIAL DOPPLER  Bilateral 12/02/2012   Right SFA stent-small amount of trickled flow which is consistent w/ greater than 90% diameter reduction with 2 vessel runoff. Left SFA demonstrated mild mixed density plaque not significant with 2 vessel runoff.  . ORIF ANKLE FRACTURE     right; "had 5 surgeries"  . PERCUTANEOUS STENT INTERVENTION  01/25/2012   Procedure: PERCUTANEOUS STENT INTERVENTION;  Surgeon: [name], MD;  Location: [facility] CATH LAB;  Service: Cardiovascular;;  . PERIPHERAL ARTERIAL STENT GRAFT Right 01/25/12; 04/08/2013   SFA; redo (04/08/2013)  . PERIPHERAL VASCULAR ANGIOGRAM  01/25/2012   Moderately severe segmental right SFA 60-70% stenosis using a 6x164mm Cordis Nitinol Smart self-expanding stent, post-dilatation with a 5x23mm balloon, resulting in reduction of 60-70% stenosis to 0% residual excellent flow.   HPI:  87 year old female presenting with right gaze deviation and left hemiplegia due to right posterior cerebral artery occlusion with resultant large right PCA territory infarct. Per notes, family has decided to proceed with comfort care measures.  Dr Jennifer Atkinson requested bedside swallowing assessment.   Assessment / Plan / Recommendation Clinical Impression  Spoke with pt's daughter and son at bedside to determine if they thought a clinical swallow assessment would be valuable in the context of comfort care, and they asked to proceed.  Ms. Jennifer Atkinson easily roused; oral care was provided. She kept her eyes closed, but interacted with family and engaged in conversation.  Speech was fluent and clear; she was pleasantly confused, smiling and making needs known appropriately. She accepted ice chips, sips  of water from a spoon and cup. There was intermittent mild coughing with multiple sips; coughing did not occur when she took small sips. She enjoyed several spoonsful of applesauce with excellent oral attention and no s/s of aspiration.  PO intake does not appear to be creating discomfort and pt verbalized  her satisfaction. Spoke with pt's daughter, son, and granddaughter about ordering a regular diet to allow a variety of choices from which family can choose- they verbalize understanding. Recommend elevating HOB, providing oral care, and offering careful hand-feeding, ceasing POs when pt shows discomfort or declines.  No further SLP f/u is needed. Our service will respectfully sign off.    *No speech/language evaluation is warranted in these circumstances.   SLP Visit Diagnosis: Dysphagia, unspecified (R13.10)    Aspiration Risk       Diet Recommendation   regular solids so that family can have greater choice of options; thin liquids  Medication Administration: Crushed with puree    Other  Recommendations Oral Care Recommendations: Oral care BID   Follow up Recommendations None      Frequency and Duration   n/a                 Swallow Study   General Date of Onset: 12/24/20 HPI: 87 year old female presenting with right gaze deviation and left hemiplegia due to right posterior cerebral artery occlusion with resultant large right PCA territory infarct. Per notes, family has decided to proceed with comfort care measures.  Dr Jennifer Atkinson requested bedside swallowing assessment. Type of Study: Bedside Swallow Evaluation Previous Swallow Assessment: 2016 Diet Prior to this Study: NPO Temperature Spikes Noted: No Respiratory Status: Room air History of Recent Intubation: No Behavior/Cognition: Pleasant mood Oral Care Completed by SLP: Yes Oral Cavity - Dentition: Adequate natural dentition Self-Feeding Abilities: Needs assist Patient Positioning: Upright in bed Baseline Vocal Quality: Normal Volitional Cough: Strong Volitional Swallow: Able to elicit    Oral/Motor/Sensory Function Overall Oral Motor/Sensory Function:  (symmetric at baseline)   Ice Chips     Thin Liquid Thin Liquid: Impaired Presentation: Spoon;Straw    Nectar Thick Nectar Thick Liquid: Not tested   Honey Thick  Honey Thick Liquid: Not tested   Puree Puree: Within functional limits   Solid     Solid: Not tested     Estill Bamberg L. Tivis Ringer, [place] Office number [phone] Pager [phone]  Juan Quam Laurice 12/25/2020,10:39 AM

## 2020-12-25 NOTE — Progress Notes (Signed)
Pt  Not reposition at this time per p/s family requestt family as resting comfortably.

## 2020-12-25 NOTE — Plan of Care (Signed)
Chart reviewed, pt is in comfort care now and pending residential hospice placement. Discussed with Dr. Cruzita Lederer, neurology will sign off for now. Please feel free to call with any questions. Thanks for the consult.  Rosalin Hawking, MD PhD Stroke Neurology 12/25/2020 12:06 PM

## 2020-12-25 NOTE — Progress Notes (Signed)
I x 13mcg bolus given from bag with good eddect

## 2020-12-26 DIAGNOSIS — I639 Cerebral infarction, unspecified: Secondary | ICD-10-CM

## 2020-12-26 NOTE — Progress Notes (Signed)
Daily Progress Note   Patient Name: Jennifer Atkinson       Date: 12/26/2020 DOB: July 03, 1920  Age: 87 y.o. MRN#: [id number] Attending Physician: No att. providers found Primary Care Physician: Alroy Dust, L.Marlou Sa, MD Admit Date: 12/24/2020  Reason for Consultation/Follow-up: symptom management, end of life care  Subjective: Patient appears comfortable. Daughter reports she is mostly sleeping but does arouse and continues to have wakeful periods of meaningful interaction with family. She expresses appreciation for PMT support and for starting scheduled Ativan yesterday evening.  No non-verbal signs of pain or discomfort noted. Respirations are even and unlabored. No excessive respiratory secretions noted.  Education and counseling provided on expectations at EOL. Emotional support provided.      Length of Stay: 2  Current Medications: Scheduled Meds:  . fentaNYL  1 patch Transdermal Q72H  . LORazepam  1 mg Intravenous Q4H    Continuous Infusions: . chlorproMAZINE (THORAZINE) IV      PRN Meds: acetaminophen **OR** acetaminophen (TYLENOL) oral liquid 160 mg/5 mL **OR** acetaminophen, albuterol, antiseptic oral rinse, chlorproMAZINE (THORAZINE) IV, diphenhydrAMINE, fentaNYL (SUBLIMAZE) injection, glycopyrrolate **OR** glycopyrrolate **OR** glycopyrrolate, haloperidol **OR** haloperidol **OR** haloperidol lactate, LORazepam **OR** LORazepam **OR** LORazepam, ondansetron **OR** ondansetron (ZOFRAN) IV, polyvinyl alcohol     Vital Signs: BP 112/62 (BP Location: Left Arm)   Pulse 91   Temp 98.1 F (36.7 C) (Oral)   Resp 18   Ht 5\' 7"  (1.702 m)   Wt 71.6 kg   SpO2 95%   BMI 24.72 kg/m  SpO2: SpO2: 95 % O2 Device: O2 Device: Room Air O2 Flow Rate:    Intake/output summary:    Intake/Output Summary (Last 24 hours) at 12/26/2020 1830 Last data filed at 12/26/2020 1546 Gross per 24 hour  Intake 42.16 ml  Output 425 ml  Net -382.84 ml   LBM: Last BM Date: 12/24/20 Baseline Weight: Weight: 71.6 kg Most recent weight: Weight: 71.6 kg       Palliative Assessment/Data: PPS 10-20%         Palliative Care Assessment & Plan   HPI/Patient Profile: 87 y.o. female  with past medical history of hypertension, hyperlipidemia, PVD, TIA, CKD stage II, and syncopal episodes presented to the emergency department on 12/24/2020 after being found down at home. Family had spoken to her last around 6pm the evening  of 4/28. They went to check on her around 1 pm the next day and found her wedged halfway underneath the bed.  ED Course: CT scan of the head revealed an acute right PCA territory infarction affecting the posterior medial temporal lobe, occipital lobe, and right thalamus. Patient was a code stroke but was outside the window for tPA. CT angiogram of the head and neck with perfusion was performed noting advanced intracranial atherosclerotic disease affecting both posterior cerebral artery territories without core infarct and was felt to be pseudo-normalization. Neurologist discussed with family that given her advanced intracranial atherosclerotic changes involving multiple vessels she is not a candidate for mechanical thrombectomy.    Patient was transitioned to full comfort care on admission per family wishes.   Assessment: - CVA with left hemiplegia - traumatic rhabdomyolysis - chronic kidney disease stage IIIb - atrial fibrillation - end of life care  Recommendations/Plan:  Continue full comfort measures  DNR/DNI as previously documented  Continue transdermal fentanyl patch 25 mcg  Continue IV fentanyl 25 ncg every 1 hours PRN for breakthrough pain  Continue scheduled Lorazepam (Ativan) 1 mg every 4 hours  Pending transfer to [facility] today  Goals of  Care and Additional Recommendations:  Limitations on Scope of Treatment: Full Comfort Care  Code Status: DNR/DNI  Prognosis:   < 2 weeks  Discharge Planning:  Hospice facility   Thank you for allowing the Palliative Medicine Team to assist in the care of this patient.   Total Time 15 minutes Prolonged Time Billed  no       Greater than 50%  of this time was spent counseling and coordinating care related to the above assessment and plan.  Lavena Bullion, NP  Please contact Palliative Medicine Team phone at [phone] for questions and concerns.

## 2020-12-26 NOTE — TOC Transition Note (Signed)
Transition of Care [facility]) - CM/SW Discharge Note   Patient Details  Name: Jennifer Atkinson MRN: [id number] Date of Birth: [birth year]  [facility]) CM/SW Contact:  Coralee Pesa, [place] Phone Number: 12/26/2020, 3:17 PM   Clinical Narrative:    Pt to be transported to [facility] via [facility]. Nurse to call report to [phone]    Final next level of care: [place] Barriers to Discharge: Barriers Resolved   Patient Goals and CMS Choice        Discharge Placement              Patient chooses bed at:  [facility]) Patient to be transferred to facility by: [place] Name of family member notified: Eustaquio Maize Patient and family notified of of transfer: 12/26/20  Discharge Plan and Services                                     Social Determinants of Health (SDOH) Interventions     Readmission Risk Interventions No flowsheet data found.

## 2020-12-26 NOTE — Progress Notes (Signed)
Fentanyl drip stopped at 0312 am after six hours of fentanyl Patch placed per physician's order, remaining medication 167.67ml  wasted in the stericycle bin in the med room,  verified with charge RN, Kyung Rudd.

## 2020-12-26 NOTE — Plan of Care (Signed)
  Problem: Education: Goal: Knowledge of General Education information will improve Description: Including pain rating scale, medication(s)/side effects and non-pharmacologic comfort measures Outcome: Adequate for Discharge   Problem: Health Behavior/Discharge Planning: Goal: Ability to manage health-related needs will improve Outcome: Adequate for Discharge   Problem: Health Behavior/Discharge Planning: Goal: Ability to manage health-related needs will improve Outcome: Adequate for Discharge   Problem: Clinical Measurements: Goal: Ability to maintain clinical measurements within normal limits will improve Outcome: Adequate for Discharge Goal: Will remain free from infection Outcome: Adequate for Discharge Goal: Diagnostic test results will improve Outcome: Adequate for Discharge Goal: Respiratory complications will improve Outcome: Adequate for Discharge Goal: Cardiovascular complication will be avoided Outcome: Adequate for Discharge   Problem: Activity: Goal: Risk for activity intolerance will decrease Outcome: Adequate for Discharge   Problem: Nutrition: Goal: Adequate nutrition will be maintained Outcome: Adequate for Discharge   Problem: Coping: Goal: Level of anxiety will decrease Outcome: Adequate for Discharge   Problem: Elimination: Goal: Will not experience complications related to bowel motility Outcome: Adequate for Discharge Goal: Will not experience complications related to urinary retention Outcome: Adequate for Discharge   Problem: Pain Managment: Goal: General experience of comfort will improve Outcome: Adequate for Discharge   Problem: Safety: Goal: Ability to remain free from injury will improve Outcome: Adequate for Discharge   Problem: Skin Integrity: Goal: Risk for impaired skin integrity will decrease Outcome: Adequate for Discharge

## 2020-12-26 NOTE — Progress Notes (Signed)
Comfortable this morning.  Stable to go to [place] M. Cruzita Lederer, MD, PhD [facility]  Between 7 am - 7 pm you can contact me via [employer] (for emergencies) or [place] (non urgent matters).  I am not available 7 pm - 7 am, please contact night coverage MD/APP via Amion

## 2020-12-26 NOTE — Plan of Care (Signed)
  Problem: Education: Goal: Knowledge of General Education information will improve Description Including pain rating scale, medication(s)/side effects and non-pharmacologic comfort measures Outcome: Progressing   

## 2020-12-26 NOTE — Progress Notes (Signed)
Handoff report given to RN at [facility].

## 2020-12-26 NOTE — Progress Notes (Signed)
Manufacturing engineer [facility]) Hospital Liaison note.    Chart reviewed and eligibility confirmed for [facility]. Spoke with family to confirm interest and explain services. Family agreeable to transfer today. TOC aware.    [facility] will notify TOC when registration paperwork has been completed to arrange transport. Please be sure a signed [place] DNR form transports with the patient.  RN please call report to [phone].  Thank you for the opportunity to participate in this patient's care.  Domenic Moras, BSN, RN [facility] Liaison (listed on [place] under Hospice/[facility])    [phone] (24h on call)

## 2020-12-26 NOTE — Progress Notes (Signed)
Patient has moderate green discharge from vagina/foley area, strong smell noted. 14 fr cath was placed 4/29. Urine is amber color, clear. Temp and vital signs are stable. Area cleansed, foley care provided. MD made aware.

## 2020-12-26 NOTE — Progress Notes (Signed)
Family removed patient's jewelry (rings) on right hand to take home, patient has single ring remaining on left hand due to swelling. Documented in chart.

## 2021-01-26 DEATH — deceased
